# Patient Record
Sex: Male | Born: 1950
Health system: Southern US, Community
[De-identification: ages and names within clinical notes are randomized; demographics above are authoritative.]

## PROBLEM LIST (undated history)

## (undated) DIAGNOSIS — E785 Hyperlipidemia, unspecified: Secondary | ICD-10-CM

## (undated) DIAGNOSIS — E291 Testicular hypofunction: Secondary | ICD-10-CM

## (undated) DIAGNOSIS — C801 Malignant (primary) neoplasm, unspecified: Secondary | ICD-10-CM

## (undated) DIAGNOSIS — G4700 Insomnia, unspecified: Secondary | ICD-10-CM

## (undated) DIAGNOSIS — T7840XA Allergy, unspecified, initial encounter: Secondary | ICD-10-CM

## (undated) DIAGNOSIS — I10 Essential (primary) hypertension: Secondary | ICD-10-CM

## (undated) DIAGNOSIS — N529 Male erectile dysfunction, unspecified: Secondary | ICD-10-CM

## (undated) DIAGNOSIS — N200 Calculus of kidney: Secondary | ICD-10-CM

## (undated) DIAGNOSIS — IMO0002 Reserved for concepts with insufficient information to code with codable children: Secondary | ICD-10-CM

## (undated) DIAGNOSIS — E1149 Type 2 diabetes mellitus with other diabetic neurological complication: Secondary | ICD-10-CM

## (undated) DIAGNOSIS — S8290XA Unspecified fracture of unspecified lower leg, initial encounter for closed fracture: Secondary | ICD-10-CM

## (undated) HISTORY — DX: Insomnia, unspecified: G47.00

## (undated) HISTORY — DX: Testicular hypofunction: E29.1

## (undated) HISTORY — DX: Calculus of kidney: N20.0

## (undated) HISTORY — DX: Hyperlipidemia, unspecified: E78.5

## (undated) HISTORY — DX: Type 2 diabetes mellitus with other diabetic neurological complication: E11.49

## (undated) HISTORY — DX: Malignant (primary) neoplasm, unspecified: C80.1

## (undated) HISTORY — PX: TONSILLECTOMY: SUR1361

## (undated) HISTORY — DX: Male erectile dysfunction, unspecified: N52.9

## (undated) HISTORY — DX: Allergy, unspecified, initial encounter: T78.40XA

## (undated) HISTORY — DX: Essential (primary) hypertension: I10

## (undated) HISTORY — DX: Reserved for concepts with insufficient information to code with codable children: IMO0002

## (undated) HISTORY — PX: EYE SURGERY: SHX253

## (undated) HISTORY — PX: REFRACTIVE SURGERY: SHX103

## (undated) HISTORY — DX: Unspecified fracture of unspecified lower leg, initial encounter for closed fracture: S82.90XA

---
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
## 2002-09-29 HISTORY — PX: NASAL SINUS SURGERY: SHX719

## 2004-10-29 ENCOUNTER — Ambulatory Visit: Payer: Self-pay | Admitting: Otolaryngology

## 2006-06-04 ENCOUNTER — Ambulatory Visit: Payer: Self-pay | Admitting: Gastroenterology

## 2011-04-07 ENCOUNTER — Emergency Department: Payer: Self-pay | Admitting: *Deleted

## 2011-04-10 ENCOUNTER — Ambulatory Visit: Payer: Self-pay | Admitting: Urology

## 2011-04-17 ENCOUNTER — Ambulatory Visit: Payer: Self-pay | Admitting: Urology

## 2013-03-03 DIAGNOSIS — Z85828 Personal history of other malignant neoplasm of skin: Secondary | ICD-10-CM | POA: Insufficient documentation

## 2015-05-25 DIAGNOSIS — E1159 Type 2 diabetes mellitus with other circulatory complications: Secondary | ICD-10-CM | POA: Insufficient documentation

## 2015-05-25 DIAGNOSIS — E1149 Type 2 diabetes mellitus with other diabetic neurological complication: Secondary | ICD-10-CM | POA: Insufficient documentation

## 2015-05-25 DIAGNOSIS — I152 Hypertension secondary to endocrine disorders: Secondary | ICD-10-CM | POA: Insufficient documentation

## 2015-05-25 DIAGNOSIS — I10 Essential (primary) hypertension: Secondary | ICD-10-CM | POA: Insufficient documentation

## 2015-05-25 DIAGNOSIS — E785 Hyperlipidemia, unspecified: Secondary | ICD-10-CM | POA: Insufficient documentation

## 2015-05-29 ENCOUNTER — Encounter: Payer: Self-pay | Admitting: Family Medicine

## 2015-05-29 ENCOUNTER — Ambulatory Visit (INDEPENDENT_AMBULATORY_CARE_PROVIDER_SITE_OTHER): Payer: BLUE CROSS/BLUE SHIELD | Admitting: Family Medicine

## 2015-05-29 VITALS — BP 110/69 | HR 64 | Temp 98.7°F | Ht 70.2 in | Wt 214.0 lb

## 2015-05-29 DIAGNOSIS — E1149 Type 2 diabetes mellitus with other diabetic neurological complication: Secondary | ICD-10-CM

## 2015-05-29 DIAGNOSIS — E1140 Type 2 diabetes mellitus with diabetic neuropathy, unspecified: Secondary | ICD-10-CM | POA: Diagnosis not present

## 2015-05-29 DIAGNOSIS — I10 Essential (primary) hypertension: Secondary | ICD-10-CM | POA: Diagnosis not present

## 2015-05-29 DIAGNOSIS — G4700 Insomnia, unspecified: Secondary | ICD-10-CM | POA: Diagnosis not present

## 2015-05-29 DIAGNOSIS — E119 Type 2 diabetes mellitus without complications: Secondary | ICD-10-CM

## 2015-05-29 LAB — MICROALBUMIN, URINE WAIVED
CREATININE, URINE WAIVED: 200 mg/dL (ref 10–300)
MICROALB, UR WAIVED: 10 mg/L (ref 0–19)
Microalb/Creat Ratio: <30 mg/g (ref <30)

## 2015-05-29 LAB — BAYER DCA HB A1C WAIVED: HB A1C (BAYER DCA - WAIVED): 7 % — ABNORMAL HIGH (ref <7.0)

## 2015-05-29 MED ORDER — SILDENAFIL CITRATE 100 MG PO TABS: 100.0000 mg | ORAL_TABLET | Freq: Every day | ORAL | Status: DC | PRN

## 2015-05-29 MED ORDER — BENAZEPRIL HCL 40 MG PO TABS: 40.0000 mg | ORAL_TABLET | Freq: Every day | ORAL | Status: DC

## 2015-05-29 MED ORDER — DAPAGLIFLOZIN PROPANEDIOL 10 MG PO TABS: 10.0000 mg | ORAL_TABLET | Freq: Every day | ORAL | Status: DC

## 2015-05-29 MED ORDER — TRAZODONE HCL 50 MG PO TABS: 25.0000 mg | ORAL_TABLET | Freq: Every evening | ORAL | Status: DC | PRN

## 2015-05-29 MED ORDER — METFORMIN HCL 500 MG PO TABS: 1000.0000 mg | ORAL_TABLET | Freq: Two times a day (BID) | ORAL | Status: DC

## 2015-05-29 MED ORDER — AMLODIPINE BESYLATE 5 MG PO TABS: 5.0000 mg | ORAL_TABLET | Freq: Every day | ORAL | Status: DC

## 2015-05-29 MED ORDER — PIOGLITAZONE HCL 30 MG PO TABS: 30.0000 mg | ORAL_TABLET | Freq: Every day | ORAL | Status: DC

## 2015-05-29 MED ORDER — ROSUVASTATIN CALCIUM 40 MG PO TABS: 40.0000 mg | ORAL_TABLET | Freq: Every day | ORAL | Status: DC

## 2015-05-29 MED ORDER — ZOLPIDEM TARTRATE 10 MG PO TABS: 10.0000 mg | ORAL_TABLET | Freq: Every evening | ORAL | Status: DC | PRN

## 2015-05-29 NOTE — Progress Notes (Signed)
BP 110/69 mmHg  Pulse 64  Temp(Src) 98.7 F (37.1 C)  Ht 5' 10.2" (1.783 m)  Wt 214 lb (97.07 kg)  BMI 30.53 kg/m2  SpO2 97%   Subjective:    Patient ID: Mark Allen, male    DOB: [birth year], 64 y.o.   MRN: [id number]  HPI: Mark Allen is a 64 y.o. male  Chief Complaint  Patient presents with  . Diabetes   diabetes doing well with no problems no low blood sugar spells  For insomnia using Ambien 5 mg and sleeping well has been on this long-term  Hypertension doing well as are her lipids.  Relevant past medical, surgical, family and social history reviewed and updated as indicated. Interim medical history since our last visit reviewed. Allergies and medications reviewed and updated.  Review of Systems  Constitutional: Negative.   Respiratory: Negative.   Cardiovascular: Negative.     Per HPI unless specifically indicated above     Objective:    BP 110/69 mmHg  Pulse 64  Temp(Src) 98.7 F (37.1 C)  Ht 5' 10.2" (1.783 m)  Wt 214 lb (97.07 kg)  BMI 30.53 kg/m2  SpO2 97%  Wt Readings from Last 3 Encounters:  05/29/15 214 lb (97.07 kg)  01/31/15 213 lb (96.616 kg)    Physical Exam  Constitutional: He is oriented to person, place, and time. He appears well-developed and well-nourished. No distress.  HENT:  Head: Normocephalic and atraumatic.  Right Ear: Hearing normal.  Left Ear: Hearing normal.  Nose: Nose normal.  Eyes: Conjunctivae and lids are normal. Right eye exhibits no discharge. Left eye exhibits no discharge. No scleral icterus.  Cardiovascular: Normal rate, regular rhythm and normal heart sounds.   Pulmonary/Chest: Effort normal and breath sounds normal. No respiratory distress.  Musculoskeletal: Normal range of motion.  Neurological: He is alert and oriented to person, place, and time.  Diminished at toes seeing foot Dr  Skin: Skin is intact. No rash noted.  Psychiatric: He has a normal mood and affect. His speech is normal and behavior is  normal. Judgment and thought content normal. Cognition and memory are normal.    No results found for this or any previous visit.    Assessment & Plan:   Problem List Items Addressed This Visit      Cardiovascular and Mediastinum   Hypertension    The current medical regimen is effective;  continue present plan and medications. Cont diet and exercise      Relevant Medications   sildenafil (VIAGRA) 100 MG tablet   rosuvastatin (CRESTOR) 40 MG tablet   benazepril (LOTENSIN) 40 MG tablet   amLODipine (NORVASC) 5 MG tablet     Endocrine   Type II diabetes mellitus with neurological manifestations    The current medical regimen is effective;  continue present plan and medications.       Relevant Medications   rosuvastatin (CRESTOR) 40 MG tablet   pioglitazone (ACTOS) 30 MG tablet   metFORMIN (GLUCOPHAGE) 500 MG tablet   dapagliflozin propanediol (FARXIGA) 10 MG TABS tablet   benazepril (LOTENSIN) 40 MG tablet     Other   Insomnia    Discuss insomnia care and treatment need to limit Ambien for special occasions will try trazodone Discuss how to use rebound dreaming etc.      Relevant Medications   zolpidem (AMBIEN) 10 MG tablet   traZODone (DESYREL) 50 MG tablet    Other Visit Diagnoses    Diabetes mellitus without complication    -  Primary    Relevant Medications    rosuvastatin (CRESTOR) 40 MG tablet    pioglitazone (ACTOS) 30 MG tablet    metFORMIN (GLUCOPHAGE) 500 MG tablet    dapagliflozin propanediol (FARXIGA) 10 MG TABS tablet    benazepril (LOTENSIN) 40 MG tablet    Other Relevant Orders    Bayer DCA Hb A1c Waived    Microalbumin, Urine Waived        Follow up plan: Return in about 3 months (around 08/29/2015) for Physical Exam.

## 2015-05-29 NOTE — Assessment & Plan Note (Signed)
The current medical regimen is effective;  continue present plan and medications. Cont diet and exercise 

## 2015-05-29 NOTE — Assessment & Plan Note (Signed)
Discuss insomnia care and treatment need to limit Ambien for special occasions will try trazodone Discuss how to use rebound dreaming etc.

## 2015-05-29 NOTE — Assessment & Plan Note (Signed)
The current medical regimen is effective;  continue present plan and medications.  

## 2015-05-31 ENCOUNTER — Other Ambulatory Visit: Payer: Self-pay | Admitting: Family Medicine

## 2015-05-31 ENCOUNTER — Telehealth: Payer: Self-pay

## 2015-05-31 MED ORDER — METFORMIN HCL 500 MG PO TABS: 1000.0000 mg | ORAL_TABLET | Freq: Two times a day (BID) | ORAL | Status: DC

## 2015-05-31 NOTE — Telephone Encounter (Signed)
Metformin should be written for #120

## 2015-07-06 ENCOUNTER — Ambulatory Visit: Payer: BLUE CROSS/BLUE SHIELD

## 2015-07-06 DIAGNOSIS — Z23 Encounter for immunization: Secondary | ICD-10-CM

## 2015-08-21 ENCOUNTER — Encounter: Payer: Self-pay | Admitting: Family Medicine

## 2015-08-21 ENCOUNTER — Ambulatory Visit (INDEPENDENT_AMBULATORY_CARE_PROVIDER_SITE_OTHER): Payer: BLUE CROSS/BLUE SHIELD | Admitting: Family Medicine

## 2015-08-21 VITALS — BP 118/72 | HR 67 | Temp 98.2°F | Ht 70.1 in | Wt 215.0 lb

## 2015-08-21 DIAGNOSIS — E119 Type 2 diabetes mellitus without complications: Secondary | ICD-10-CM

## 2015-08-21 DIAGNOSIS — G4700 Insomnia, unspecified: Secondary | ICD-10-CM

## 2015-08-21 DIAGNOSIS — Z113 Encounter for screening for infections with a predominantly sexual mode of transmission: Secondary | ICD-10-CM

## 2015-08-21 DIAGNOSIS — E1149 Type 2 diabetes mellitus with other diabetic neurological complication: Secondary | ICD-10-CM | POA: Diagnosis not present

## 2015-08-21 DIAGNOSIS — I10 Essential (primary) hypertension: Secondary | ICD-10-CM

## 2015-08-21 DIAGNOSIS — Z0000 Encounter for general adult medical examination without abnormal findings: Secondary | ICD-10-CM

## 2015-08-21 LAB — URINALYSIS, ROUTINE W REFLEX MICROSCOPIC
Bilirubin, UA: NEGATIVE
LEUKOCYTES UA: NEGATIVE
NITRITE UA: NEGATIVE
PH UA: 6.5 (ref 5.0–7.5)
Protein, UA: NEGATIVE
RBC, UA: NEGATIVE
Specific Gravity, UA: 1.015 (ref 1.005–1.030)
UUROB: 1 mg/dL (ref 0.2–1.0)

## 2015-08-21 LAB — BAYER DCA HB A1C WAIVED: HB A1C (BAYER DCA - WAIVED): 7.3 % — ABNORMAL HIGH (ref <7.0)

## 2015-08-21 MED ORDER — DAPAGLIFLOZIN PROPANEDIOL 10 MG PO TABS: 10.0000 mg | ORAL_TABLET | Freq: Every day | ORAL | Status: DC

## 2015-08-21 MED ORDER — BENAZEPRIL HCL 40 MG PO TABS: 40.0000 mg | ORAL_TABLET | Freq: Every day | ORAL | Status: DC

## 2015-08-21 MED ORDER — METFORMIN HCL 500 MG PO TABS: 1000.0000 mg | ORAL_TABLET | Freq: Two times a day (BID) | ORAL | Status: DC

## 2015-08-21 MED ORDER — AMLODIPINE BESYLATE 5 MG PO TABS: 5.0000 mg | ORAL_TABLET | Freq: Every day | ORAL | Status: DC

## 2015-08-21 MED ORDER — PIOGLITAZONE HCL 30 MG PO TABS: 30.0000 mg | ORAL_TABLET | Freq: Every day | ORAL | Status: DC

## 2015-08-21 MED ORDER — ROSUVASTATIN CALCIUM 40 MG PO TABS: 40.0000 mg | ORAL_TABLET | Freq: Every day | ORAL | Status: DC

## 2015-08-21 MED ORDER — TRAZODONE HCL 50 MG PO TABS: 25.0000 mg | ORAL_TABLET | Freq: Every evening | ORAL | Status: DC | PRN

## 2015-08-21 NOTE — Assessment & Plan Note (Signed)
The current medical regimen is effective;  continue present plan and medications.  

## 2015-08-21 NOTE — Assessment & Plan Note (Signed)
Stable with trazodone uses rare Ambien

## 2015-08-21 NOTE — Assessment & Plan Note (Signed)
Patient with elevated A1c Discussed better diet exercise nutrition for control with energy and feeling better. We'll not add more medicine at this point.

## 2015-08-21 NOTE — Progress Notes (Signed)
BP 118/72 mmHg  Pulse 67  Temp(Src) 98.2 F (36.8 C)  Ht 5' 10.1" (1.781 m)  Wt 215 lb (97.523 kg)  BMI 30.75 kg/m2  SpO2 96%   Subjective:    Patient ID: Mark Allen, male    DOB: 1950-12-29, 64 y.o.   MRN: [id number]  HPI: Mark Allen is a 64 y.o. male  Chief Complaint  Patient presents with  . Annual Exam  . Diabetes   Patient doing okay except some fatigue but on review limited sleep with poor nutrition Having some foot issues No foot numbness Does have scratches on his leg from his dog Relevant past medical, surgical, family and social history reviewed and updated as indicated. Interim medical history since our last visit reviewed. Allergies and medications reviewed and updated.  Review of Systems  Constitutional: Negative.   HENT: Negative.   Eyes: Negative.   Respiratory: Negative.   Cardiovascular: Negative.   Gastrointestinal: Negative.   Endocrine: Negative.   Genitourinary: Negative.   Musculoskeletal: Negative.   Skin: Negative.   Allergic/Immunologic: Negative.   Neurological: Negative.   Hematological: Negative.   Psychiatric/Behavioral: Negative.     Per HPI unless specifically indicated above     Objective:    BP 118/72 mmHg  Pulse 67  Temp(Src) 98.2 F (36.8 C)  Ht 5' 10.1" (1.781 m)  Wt 215 lb (97.523 kg)  BMI 30.75 kg/m2  SpO2 96%  Wt Readings from Last 3 Encounters:  08/21/15 215 lb (97.523 kg)  05/29/15 214 lb (97.07 kg)  01/31/15 213 lb (96.616 kg)    Physical Exam  Constitutional: He is oriented to person, place, and time. He appears well-developed and well-nourished.  HENT:  Head: Normocephalic and atraumatic.  Right Ear: External ear normal.  Left Ear: External ear normal.  Eyes: Conjunctivae and EOM are normal. Pupils are equal, round, and reactive to light.  Neck: Normal range of motion. Neck supple.  Cardiovascular: Normal rate, regular rhythm, normal heart sounds and intact distal pulses.   Pulmonary/Chest:  Effort normal and breath sounds normal.  Abdominal: Soft. Bowel sounds are normal. There is no splenomegaly or hepatomegaly.  Genitourinary: Rectum normal, prostate normal and penis normal.  Musculoskeletal: Normal range of motion.  Neurological: He is alert and oriented to person, place, and time. He has normal reflexes.  Skin: No rash noted. No erythema.  Psychiatric: He has a normal mood and affect. His behavior is normal. Judgment and thought content normal.    Results for orders placed or performed in visit on 05/29/15  Bayer DCA Hb A1c Waived  Result Value Ref Range   Bayer DCA Hb A1c Waived 7.0 (H) <7.0 %  Microalbumin, Urine Waived  Result Value Ref Range   Microalb, Ur Waived 10 0 - 19 mg/L   Creatinine, Urine Waived 200 10 - 300 mg/dL   Microalb/Creat Ratio <30 <30 mg/g      Assessment & Plan:   Problem List Items Addressed This Visit      Cardiovascular and Mediastinum   Hypertension    The current medical regimen is effective;  continue present plan and medications.       Relevant Medications   amLODipine (NORVASC) 5 MG tablet   benazepril (LOTENSIN) 40 MG tablet   rosuvastatin (CRESTOR) 40 MG tablet     Endocrine   Type II diabetes mellitus with neurological manifestations [facility])    Patient with elevated A1c Discussed better diet exercise nutrition for control with energy and feeling  better. We'll not add more medicine at this point.      Relevant Medications   benazepril (LOTENSIN) 40 MG tablet   dapagliflozin propanediol (FARXIGA) 10 MG TABS tablet   pioglitazone (ACTOS) 30 MG tablet   rosuvastatin (CRESTOR) 40 MG tablet   metFORMIN (GLUCOPHAGE) 500 MG tablet     Other   Insomnia    Stable with trazodone uses rare Ambien      Relevant Medications   traZODone (DESYREL) 50 MG tablet    Other Visit Diagnoses    Routine general medical examination at a health care facility    -  Primary    Relevant Orders    CBC with Differential/Platelet     Comprehensive metabolic panel    Lipid Panel w/o Chol/HDL Ratio    PSA    TSH    Urinalysis, Routine w reflex microscopic (not at [facility])    Routine screening for STI (sexually transmitted infection)        Relevant Orders    HIV antibody    Hepatitis C Antibody    Diabetes mellitus without complication (HCC)        Relevant Medications    benazepril (LOTENSIN) 40 MG tablet    dapagliflozin propanediol (FARXIGA) 10 MG TABS tablet    pioglitazone (ACTOS) 30 MG tablet    rosuvastatin (CRESTOR) 40 MG tablet    metFORMIN (GLUCOPHAGE) 500 MG tablet    Other Relevant Orders    Bayer DCA Hb A1c Waived        Follow up plan: Return in about 3 months (around 11/21/2015) for A1c.

## 2015-08-22 ENCOUNTER — Encounter: Payer: Self-pay | Admitting: Family Medicine

## 2015-08-22 LAB — COMPREHENSIVE METABOLIC PANEL
A/G RATIO: 1.5 (ref 1.1–2.5)
ALT: 20 IU/L (ref 0–44)
AST: 15 IU/L (ref 0–40)
Albumin: 4.3 g/dL (ref 3.6–4.8)
Alkaline Phosphatase: 73 IU/L (ref 39–117)
BUN/Creatinine Ratio: 20 (ref 10–22)
BUN: 16 mg/dL (ref 8–27)
Bilirubin Total: 0.5 mg/dL (ref 0.0–1.2)
CALCIUM: 9.5 mg/dL (ref 8.6–10.2)
CO2: 26 mmol/L (ref 18–29)
CREATININE: 0.79 mg/dL (ref 0.76–1.27)
Chloride: 102 mmol/L (ref 97–106)
GFR, EST AFRICAN AMERICAN: 111 mL/min/{1.73_m2} (ref >59)
GFR, EST NON AFRICAN AMERICAN: 96 mL/min/{1.73_m2} (ref >59)
GLOBULIN, TOTAL: 2.8 g/dL (ref 1.5–4.5)
Glucose: 150 mg/dL — ABNORMAL HIGH (ref 65–99)
POTASSIUM: 4.7 mmol/L (ref 3.5–5.2)
SODIUM: 141 mmol/L (ref 136–144)
TOTAL PROTEIN: 7.1 g/dL (ref 6.0–8.5)

## 2015-08-22 LAB — CBC WITH DIFFERENTIAL/PLATELET
BASOS: 0 %
Basophils Absolute: 0 10*3/uL (ref 0.0–0.2)
EOS (ABSOLUTE): 0.2 10*3/uL (ref 0.0–0.4)
EOS: 2 %
HEMATOCRIT: 46.4 % (ref 37.5–51.0)
Hemoglobin: 15.7 g/dL (ref 12.6–17.7)
IMMATURE GRANS (ABS): 0 10*3/uL (ref 0.0–0.1)
IMMATURE GRANULOCYTES: 0 %
LYMPHS: 27 %
Lymphocytes Absolute: 2.2 10*3/uL (ref 0.7–3.1)
MCH: 31.3 pg (ref 26.6–33.0)
MCHC: 33.8 g/dL (ref 31.5–35.7)
MCV: 93 fL (ref 79–97)
MONOCYTES: 12 %
Monocytes Absolute: 1 10*3/uL — ABNORMAL HIGH (ref 0.1–0.9)
NEUTROS PCT: 59 %
Neutrophils Absolute: 4.9 10*3/uL (ref 1.4–7.0)
Platelets: 367 10*3/uL (ref 150–379)
RBC: 5.01 x10E6/uL (ref 4.14–5.80)
RDW: 13.3 % (ref 12.3–15.4)
WBC: 8.3 10*3/uL (ref 3.4–10.8)

## 2015-08-22 LAB — PSA: PROSTATE SPECIFIC AG, SERUM: 0.2 ng/mL (ref 0.0–4.0)

## 2015-08-22 LAB — TSH: TSH: 1.27 u[IU]/mL (ref 0.450–4.500)

## 2015-08-22 LAB — LIPID PANEL W/O CHOL/HDL RATIO
Cholesterol, Total: 167 mg/dL (ref 100–199)
HDL: 58 mg/dL (ref >39)
LDL CALC: 90 mg/dL (ref 0–99)
Triglycerides: 95 mg/dL (ref 0–149)
VLDL Cholesterol Cal: 19 mg/dL (ref 5–40)

## 2015-08-22 LAB — HIV ANTIBODY (ROUTINE TESTING W REFLEX): HIV SCREEN 4TH GENERATION: NONREACTIVE

## 2015-08-22 LAB — HEPATITIS C ANTIBODY: Hep C Virus Ab: 0.1 s/co ratio (ref 0.0–0.9)

## 2015-11-21 ENCOUNTER — Encounter: Payer: Self-pay | Admitting: Family Medicine

## 2015-11-21 ENCOUNTER — Ambulatory Visit (INDEPENDENT_AMBULATORY_CARE_PROVIDER_SITE_OTHER): Payer: BLUE CROSS/BLUE SHIELD | Admitting: Family Medicine

## 2015-11-21 VITALS — BP 119/74 | HR 57 | Temp 98.5°F | Ht 69.3 in | Wt 209.0 lb

## 2015-11-21 DIAGNOSIS — I10 Essential (primary) hypertension: Secondary | ICD-10-CM

## 2015-11-21 DIAGNOSIS — E1149 Type 2 diabetes mellitus with other diabetic neurological complication: Secondary | ICD-10-CM | POA: Diagnosis not present

## 2015-11-21 DIAGNOSIS — G4700 Insomnia, unspecified: Secondary | ICD-10-CM

## 2015-11-21 DIAGNOSIS — E785 Hyperlipidemia, unspecified: Secondary | ICD-10-CM | POA: Diagnosis not present

## 2015-11-21 LAB — BAYER DCA HB A1C WAIVED: HB A1C: 7.2 % — AB (ref <7.0)

## 2015-11-21 MED ORDER — DULOXETINE HCL 60 MG PO CPEP: 60.0000 mg | ORAL_CAPSULE | Freq: Every day | ORAL | Status: DC

## 2015-11-21 MED ORDER — DULOXETINE HCL 30 MG PO CPEP: 30.0000 mg | ORAL_CAPSULE | Freq: Every day | ORAL | Status: DC

## 2015-11-21 NOTE — Assessment & Plan Note (Addendum)
Patient with A1c of 7.2 7.3 in November will continue diet exercise for control if not better next visit will consider more medications Chart review neuropathy tried gabapentin and Lyrica hasn't given Cymbalta a trial

## 2015-11-21 NOTE — Progress Notes (Addendum)
BP 119/74 mmHg  Pulse 57  Temp(Src) 98.5 F (36.9 C)  Ht 5' 9.3" (1.76 m)  Wt 209 lb (94.802 kg)  BMI 30.60 kg/m2  SpO2 96%   Subjective:    Patient ID: Mark Allen, male    DOB: 1951-07-23, 65 y.o.   MRN: [id number]  HPI: Mark Allen is a 65 y.o. male  Chief Complaint  Patient presents with  . Diabetes   Patient all in all doing well has lost weight over the holiday season no low blood sugar spells doing well with medications takes faithfully with no side effects Blood pressure good control Cholesterol good control Sleep does well Takes trazodone nightly Ambien on a rare occasions Patient with diabetic peripheral neuropathy bothers him again will stress something else thinks he took gabapentin previously Relevant past medical, surgical, family and social history reviewed and updated as indicated. Interim medical history since our last visit reviewed. Allergies and medications reviewed and updated.  Review of Systems  Constitutional: Negative.   Respiratory: Negative.   Cardiovascular: Negative.     Per HPI unless specifically indicated above     Objective:    BP 119/74 mmHg  Pulse 57  Temp(Src) 98.5 F (36.9 C)  Ht 5' 9.3" (1.76 m)  Wt 209 lb (94.802 kg)  BMI 30.60 kg/m2  SpO2 96%  Wt Readings from Last 3 Encounters:  11/21/15 209 lb (94.802 kg)  08/21/15 215 lb (97.523 kg)  05/29/15 214 lb (97.07 kg)    Physical Exam  Constitutional: He is oriented to person, place, and time. He appears well-developed and well-nourished. No distress.  HENT:  Head: Normocephalic and atraumatic.  Right Ear: Hearing normal.  Left Ear: Hearing normal.  Nose: Nose normal.  Eyes: Conjunctivae and lids are normal. Right eye exhibits no discharge. Left eye exhibits no discharge. No scleral icterus.  Cardiovascular: Normal rate, regular rhythm and normal heart sounds.   Pulmonary/Chest: Effort normal and breath sounds normal. No respiratory distress.  Musculoskeletal:  Normal range of motion.  Neurological: He is alert and oriented to person, place, and time.  Skin: Skin is intact. No rash noted.  Psychiatric: He has a normal mood and affect. His speech is normal and behavior is normal. Judgment and thought content normal. Cognition and memory are normal.    Results for orders placed or performed in visit on 08/21/15  CBC with Differential/Platelet  Result Value Ref Range   WBC 8.3 3.4 - 10.8 x10E3/uL   RBC 5.01 4.14 - 5.80 x10E6/uL   Hemoglobin 15.7 12.6 - 17.7 g/dL   Hematocrit 46.4 37.5 - 51.0 %   MCV 93 79 - 97 fL   MCH 31.3 26.6 - 33.0 pg   MCHC 33.8 31.5 - 35.7 g/dL   RDW 13.3 12.3 - 15.4 %   Platelets 367 150 - 379 x10E3/uL   Neutrophils 59 %   Lymphs 27 %   Monocytes 12 %   Eos 2 %   Basos 0 %   Neutrophils Absolute 4.9 1.4 - 7.0 x10E3/uL   Lymphocytes Absolute 2.2 0.7 - 3.1 x10E3/uL   Monocytes Absolute 1.0 (H) 0.1 - 0.9 x10E3/uL   EOS (ABSOLUTE) 0.2 0.0 - 0.4 x10E3/uL   Basophils Absolute 0.0 0.0 - 0.2 x10E3/uL   Immature Granulocytes 0 %   Immature Grans (Abs) 0.0 0.0 - 0.1 x10E3/uL  Comprehensive metabolic panel  Result Value Ref Range   Glucose 150 (H) 65 - 99 mg/dL   BUN 16 8 -  27 mg/dL   Creatinine, Ser 0.79 0.76 - 1.27 mg/dL   GFR calc non Af Amer 96 >59 mL/min/1.73   GFR calc Af Amer 111 >59 mL/min/1.73   BUN/Creatinine Ratio 20 10 - 22   Sodium 141 136 - 144 mmol/L   Potassium 4.7 3.5 - 5.2 mmol/L   Chloride 102 97 - 106 mmol/L   CO2 26 18 - 29 mmol/L   Calcium 9.5 8.6 - 10.2 mg/dL   Total Protein 7.1 6.0 - 8.5 g/dL   Albumin 4.3 3.6 - 4.8 g/dL   Globulin, Total 2.8 1.5 - 4.5 g/dL   Albumin/Globulin Ratio 1.5 1.1 - 2.5   Bilirubin Total 0.5 0.0 - 1.2 mg/dL   Alkaline Phosphatase 73 39 - 117 IU/L   AST 15 0 - 40 IU/L   ALT 20 0 - 44 IU/L  Lipid Panel w/o Chol/HDL Ratio  Result Value Ref Range   Cholesterol, Total 167 100 - 199 mg/dL   Triglycerides 95 0 - 149 mg/dL   HDL 58 >39 mg/dL   VLDL Cholesterol Cal 19  5 - 40 mg/dL   LDL Calculated 90 0 - 99 mg/dL  PSA  Result Value Ref Range   Prostate Specific Ag, Serum 0.2 0.0 - 4.0 ng/mL  HIV antibody  Result Value Ref Range   HIV Screen 4th Generation wRfx Non Reactive Non Reactive  TSH  Result Value Ref Range   TSH 1.270 0.450 - 4.500 uIU/mL  Urinalysis, Routine w reflex microscopic (not at [facility])  Result Value Ref Range   Specific Gravity, UA 1.015 1.005 - 1.030   pH, UA 6.5 5.0 - 7.5   Color, UA Yellow Yellow   Appearance Ur Clear Clear   Leukocytes, UA Negative Negative   Protein, UA Negative Negative/Trace   Glucose, UA 3+ (A) Negative   Ketones, UA Trace (A) Negative   RBC, UA Negative Negative   Bilirubin, UA Negative Negative   Urobilinogen, Ur 1.0 0.2 - 1.0 mg/dL   Nitrite, UA Negative Negative  Hepatitis C Antibody  Result Value Ref Range   Hep C Virus Ab 0.1 0.0 - 0.9 s/co ratio  Bayer DCA Hb A1c Waived  Result Value Ref Range   Bayer DCA Hb A1c Waived 7.3 (H) <7.0 %      Assessment & Plan:   Problem List Items Addressed This Visit      Cardiovascular and Mediastinum   Hypertension    The current medical regimen is effective;  continue present plan and medications.         Endocrine   Type II diabetes mellitus with neurological manifestations ([place]) - Primary    Patient with A1c of 7.2 7.3 in November will continue diet exercise for control if not better next visit will consider more medications Chart review neuropathy tried gabapentin and Lyrica hasn't given Cymbalta a trial      Relevant Orders   Bayer DCA Hb A1c Waived     Other   Insomnia    The current medical regimen is effective;  continue present plan and medications.       Hyperlipidemia    The current medical regimen is effective;  continue present plan and medications.           Follow up plan: Return in about 3 months (around 02/18/2016) for Med check plus BMP, lipids, ALT, AST, BMP.

## 2015-11-21 NOTE — Assessment & Plan Note (Signed)
The current medical regimen is effective;  continue present plan and medications.  

## 2015-11-21 NOTE — Addendum Note (Signed)
Addended byGolden Pop on: 11/21/2015 04:17 PM   Modules accepted: Orders

## 2016-02-21 ENCOUNTER — Encounter: Payer: Self-pay | Admitting: Family Medicine

## 2016-02-21 ENCOUNTER — Ambulatory Visit (INDEPENDENT_AMBULATORY_CARE_PROVIDER_SITE_OTHER): Payer: BLUE CROSS/BLUE SHIELD | Admitting: Family Medicine

## 2016-02-21 VITALS — BP 131/77 | HR 67 | Temp 97.7°F | Ht 69.3 in | Wt 203.0 lb

## 2016-02-21 DIAGNOSIS — G4700 Insomnia, unspecified: Secondary | ICD-10-CM | POA: Diagnosis not present

## 2016-02-21 DIAGNOSIS — E785 Hyperlipidemia, unspecified: Secondary | ICD-10-CM

## 2016-02-21 DIAGNOSIS — I10 Essential (primary) hypertension: Secondary | ICD-10-CM

## 2016-02-21 DIAGNOSIS — E1149 Type 2 diabetes mellitus with other diabetic neurological complication: Secondary | ICD-10-CM

## 2016-02-21 LAB — LP+ALT+AST PICCOLO, WAIVED
ALT (SGPT) Piccolo, Waived: 18 U/L (ref 10–47)
AST (SGOT) Piccolo, Waived: 27 U/L (ref 11–38)
Chol/HDL Ratio Piccolo,Waive: 2.6 mg/dL
Cholesterol Piccolo, Waived: 174 mg/dL (ref <200)
HDL CHOL PICCOLO, WAIVED: 68 mg/dL (ref >59)
LDL Chol Calc Piccolo Waived: 89 mg/dL (ref <100)
Triglycerides Piccolo,Waived: 86 mg/dL (ref <150)
VLDL Chol Calc Piccolo,Waive: 17 mg/dL (ref <30)

## 2016-02-21 LAB — BAYER DCA HB A1C WAIVED: HB A1C (BAYER DCA - WAIVED): 7.1 % — ABNORMAL HIGH (ref <7.0)

## 2016-02-21 MED ORDER — ZOLPIDEM TARTRATE 10 MG PO TABS: 10.0000 mg | ORAL_TABLET | Freq: Every evening | ORAL | Status: DC | PRN

## 2016-02-21 MED ORDER — DULOXETINE HCL 60 MG PO CPEP: 60.0000 mg | ORAL_CAPSULE | Freq: Every day | ORAL | Status: DC

## 2016-02-21 NOTE — Progress Notes (Signed)
BP 131/77 mmHg  Pulse 67  Temp(Src) 97.7 F (36.5 C)  Ht 5' 9.3" (1.76 m)  Wt 203 lb (92.08 kg)  BMI 29.73 kg/m2  SpO2 98%   Subjective:    Patient ID: Mark Allen, male    DOB: 06-Nov-1950, 65 y.o.   MRN: [id number]  HPI: Mark Allen is a 65 y.o. male  Chief Complaint  Patient presents with  . Diabetes  . Hyperlipidemia  . Hypertension  Patient rechecks doing well with great weight loss 12 pounds over the last 6 months blood sugars been doing well no complaints no problems with medications no side effects noted low blood sugar spells Blood pressure cholesterol doing well no complaints taking medications faithfully spelled good diet  Relevant past medical, surgical, family and social history reviewed and updated as indicated. Interim medical history since our last visit reviewed. Allergies and medications reviewed and updated.  Review of Systems  Constitutional: Negative.   Respiratory: Negative.   Cardiovascular: Negative.     Per HPI unless specifically indicated above     Objective:    BP 131/77 mmHg  Pulse 67  Temp(Src) 97.7 F (36.5 C)  Ht 5' 9.3" (1.76 m)  Wt 203 lb (92.08 kg)  BMI 29.73 kg/m2  SpO2 98%  Wt Readings from Last 3 Encounters:  02/21/16 203 lb (92.08 kg)  11/21/15 209 lb (94.802 kg)  08/21/15 215 lb (97.523 kg)    Physical Exam  Constitutional: He is oriented to person, place, and time. He appears well-developed and well-nourished. No distress.  HENT:  Head: Normocephalic and atraumatic.  Right Ear: Hearing normal.  Left Ear: Hearing normal.  Nose: Nose normal.  Eyes: Conjunctivae and lids are normal. Right eye exhibits no discharge. Left eye exhibits no discharge. No scleral icterus.  Cardiovascular: Normal rate, regular rhythm and normal heart sounds.   Pulmonary/Chest: Effort normal and breath sounds normal. No respiratory distress.  Musculoskeletal: Normal range of motion.  Neurological: He is alert and oriented to person,  place, and time.  Skin: Skin is intact. No rash noted.  Psychiatric: He has a normal mood and affect. His speech is normal and behavior is normal. Judgment and thought content normal. Cognition and memory are normal.    Results for orders placed or performed in visit on 11/21/15  Bayer DCA Hb A1c Waived  Result Value Ref Range   Bayer DCA Hb A1c Waived 7.2 (H) <7.0 %      Assessment & Plan:   Problem List Items Addressed This Visit      Cardiovascular and Mediastinum   Hypertension    The current medical regimen is effective;  continue present plan and medications.       Relevant Orders   Basic metabolic panel     Endocrine   Type II diabetes mellitus with neurological manifestations ([place]) - Primary    The current medical regimen is effective;  continue present plan and medications.       Relevant Orders   Bayer DCA Hb A1c Waived     Other   Hyperlipidemia    The current medical regimen is effective;  continue present plan and medications. Discuss LDL over 100 pt  drops from 2007 patient will try to get better control with continuing diet exercise nutrition otherwise the medications alone      Relevant Orders   LP+ALT+AST [place], Waived   Insomnia   Relevant Medications   zolpidem (AMBIEN) 10 MG tablet  Follow up plan: Return in about 3 months (around 05/23/2016), or if symptoms worsen or fail to improve, for A1c.

## 2016-02-21 NOTE — Assessment & Plan Note (Signed)
The current medical regimen is effective;  continue present plan and medications.  

## 2016-02-21 NOTE — Assessment & Plan Note (Signed)
The current medical regimen is effective;  continue present plan and medications. Discuss LDL over 100 pt  drops from 2007 patient will try to get better control with continuing diet exercise nutrition otherwise the medications alone

## 2016-02-22 LAB — BASIC METABOLIC PANEL
BUN/Creatinine Ratio: 28 — ABNORMAL HIGH (ref 10–24)
BUN: 20 mg/dL (ref 8–27)
CALCIUM: 9.3 mg/dL (ref 8.6–10.2)
CHLORIDE: 99 mmol/L (ref 96–106)
CO2: 23 mmol/L (ref 18–29)
CREATININE: 0.71 mg/dL — AB (ref 0.76–1.27)
GFR, EST AFRICAN AMERICAN: 115 mL/min/{1.73_m2} (ref >59)
GFR, EST NON AFRICAN AMERICAN: 100 mL/min/{1.73_m2} (ref >59)
Glucose: 95 mg/dL (ref 65–99)
Potassium: 4.4 mmol/L (ref 3.5–5.2)
Sodium: 141 mmol/L (ref 134–144)

## 2016-02-26 ENCOUNTER — Encounter: Payer: Self-pay | Admitting: Family Medicine

## 2016-05-14 LAB — HM DIABETES EYE EXAM

## 2016-05-22 ENCOUNTER — Other Ambulatory Visit: Payer: Self-pay

## 2016-05-22 ENCOUNTER — Ambulatory Visit (INDEPENDENT_AMBULATORY_CARE_PROVIDER_SITE_OTHER): Payer: BLUE CROSS/BLUE SHIELD | Admitting: Family Medicine

## 2016-05-22 ENCOUNTER — Encounter: Payer: Self-pay | Admitting: Family Medicine

## 2016-05-22 VITALS — BP 151/82 | HR 61 | Temp 98.0°F | Ht 69.0 in | Wt 208.6 lb

## 2016-05-22 DIAGNOSIS — E1140 Type 2 diabetes mellitus with diabetic neuropathy, unspecified: Secondary | ICD-10-CM

## 2016-05-22 DIAGNOSIS — E1149 Type 2 diabetes mellitus with other diabetic neurological complication: Secondary | ICD-10-CM

## 2016-05-22 DIAGNOSIS — Z1211 Encounter for screening for malignant neoplasm of colon: Secondary | ICD-10-CM | POA: Diagnosis not present

## 2016-05-22 DIAGNOSIS — I10 Essential (primary) hypertension: Secondary | ICD-10-CM | POA: Diagnosis not present

## 2016-05-22 DIAGNOSIS — E785 Hyperlipidemia, unspecified: Secondary | ICD-10-CM

## 2016-05-22 DIAGNOSIS — Z1212 Encounter for screening for malignant neoplasm of rectum: Secondary | ICD-10-CM

## 2016-05-22 LAB — BAYER DCA HB A1C WAIVED: HB A1C (BAYER DCA - WAIVED): 7.6 % — ABNORMAL HIGH (ref <7.0)

## 2016-05-22 LAB — HEMOGLOBIN A1C: Hemoglobin A1C: 7.6

## 2016-05-22 LAB — MICROALBUMIN, URINE WAIVED
Creatinine, Urine Waived: 200 mg/dL (ref 10–300)
Microalb, Ur Waived: 30 mg/L — ABNORMAL HIGH (ref 0–19)
Microalb/Creat Ratio: <30 mg/g (ref <30)

## 2016-05-22 MED ORDER — EMPAGLIFLOZIN 25 MG PO TABS: 25.0000 mg | ORAL_TABLET | Freq: Every day | ORAL | 4 refills | Status: DC

## 2016-05-22 NOTE — Progress Notes (Signed)
BP (!) 151/82 (BP Location: Left Arm, Patient Position: Sitting, Cuff Size: Normal)   Pulse 61   Temp 98 F (36.7 C)   Ht 5\' 9"  (1.753 m)   Wt 208 lb 9.6 oz (94.6 kg)   BMI 30.80 kg/m    Subjective:    Patient ID: Mark Allen, male    DOB: July 25, 1951, 65 y.o.   MRN: [id number]  HPI: Mark Allen is a 65 y.o. male  Chief Complaint  Patient presents with  . Follow-up  . Diabetes  Patient follow-up has been struggling has run out of blood pressure medicines and not taken today. As a consequence blood pressure is of course up. Diabetes patient had stopped for cigarettes some time ago due to unknown insurance hassles and has also been gaining weight because of summertime enjoyment of food.  Other meds have been doing okay.  Relevant past medical, surgical, family and social history reviewed and updated as indicated. Interim medical history since our last visit reviewed. Allergies and medications reviewed and updated.  Review of Systems  Constitutional: Negative.   Respiratory: Negative.   Cardiovascular: Negative.     Per HPI unless specifically indicated above     Objective:    BP (!) 151/82 (BP Location: Left Arm, Patient Position: Sitting, Cuff Size: Normal)   Pulse 61   Temp 98 F (36.7 C)   Ht 5\' 9"  (1.753 m)   Wt 208 lb 9.6 oz (94.6 kg)   BMI 30.80 kg/m   Wt Readings from Last 3 Encounters:  05/22/16 208 lb 9.6 oz (94.6 kg)  02/21/16 203 lb (92.1 kg)  11/21/15 209 lb (94.8 kg)    Physical Exam  Constitutional: He is oriented to person, place, and time. He appears well-developed and well-nourished. No distress.  HENT:  Head: Normocephalic and atraumatic.  Right Ear: Hearing normal.  Left Ear: Hearing normal.  Nose: Nose normal.  Eyes: Conjunctivae and lids are normal. Right eye exhibits no discharge. Left eye exhibits no discharge. No scleral icterus.  Cardiovascular: Normal rate, regular rhythm and normal heart sounds.   Pulmonary/Chest: Effort  normal and breath sounds normal. No respiratory distress.  Musculoskeletal: Normal range of motion.  Neurological: He is alert and oriented to person, place, and time.  Skin: Skin is intact. No rash noted.  Psychiatric: He has a normal mood and affect. His speech is normal and behavior is normal. Judgment and thought content normal. Cognition and memory are normal.    Results for orders placed or performed in visit on 05/22/16  HM DIABETES EYE EXAM  Result Value Ref Range   HM Diabetic Eye Exam No Retinopathy No Retinopathy      Assessment & Plan:   Problem List Items Addressed This Visit      Cardiovascular and Mediastinum   Hypertension    Poor control but off medicines will restart        Endocrine   Type II diabetes mellitus with neurological manifestations ([place])    Control will start Jardiance discuss better diet nutrition weight loss medication complications with insurance patient will call if any complications to avoid loss of treatment and worsening of neurologic symptoms.      Relevant Medications   empagliflozin (JARDIANCE) 25 MG TABS tablet     Other   Hyperlipidemia    The current medical regimen is effective;  continue present plan and medications.        Other Visit Diagnoses    Type 2 diabetes  mellitus with diabetic neuropathy, unspecified long term insulin use status ([place])       Relevant Medications   empagliflozin (JARDIANCE) 25 MG TABS tablet       Follow up plan: Return in about 3 months (around 08/22/2016) for Physical Exam, Hemoglobin A1c.

## 2016-05-22 NOTE — Assessment & Plan Note (Signed)
The current medical regimen is effective;  continue present plan and medications.  

## 2016-05-22 NOTE — Addendum Note (Signed)
Addended by: Sandria Manly on: 05/22/2016 05:14 PM   Modules accepted: Orders

## 2016-05-22 NOTE — Assessment & Plan Note (Signed)
Poor control but off medicines will restart

## 2016-05-22 NOTE — Assessment & Plan Note (Signed)
Control will start Jardiance discuss better diet nutrition weight loss medication complications with insurance patient will call if any complications to avoid loss of treatment and worsening of neurologic symptoms.

## 2016-06-25 ENCOUNTER — Ambulatory Visit (INDEPENDENT_AMBULATORY_CARE_PROVIDER_SITE_OTHER): Payer: BLUE CROSS/BLUE SHIELD

## 2016-06-25 ENCOUNTER — Telehealth: Payer: Self-pay

## 2016-06-25 DIAGNOSIS — Z23 Encounter for immunization: Secondary | ICD-10-CM

## 2016-06-25 NOTE — Telephone Encounter (Signed)
Patient is interested in having a Sleep Study.  Wife has noticed snoring.  I gave him a [employer] form to call his insurance about coverage and to go over symptoms.   Wants a call when MAC returns, about referral

## 2016-07-01 NOTE — Telephone Encounter (Signed)
Patient will hold off on Sleep Study, insurance and cost are an issue

## 2016-07-10 ENCOUNTER — Other Ambulatory Visit: Payer: Self-pay | Admitting: Family Medicine

## 2016-07-30 LAB — HM DIABETES EYE EXAM

## 2016-08-27 ENCOUNTER — Other Ambulatory Visit: Payer: Self-pay | Admitting: Family Medicine

## 2016-08-27 DIAGNOSIS — G4700 Insomnia, unspecified: Secondary | ICD-10-CM

## 2016-08-27 NOTE — Telephone Encounter (Signed)
Routing to provider, he has appt on 09/16/16.

## 2016-08-28 ENCOUNTER — Other Ambulatory Visit: Payer: Self-pay

## 2016-08-28 NOTE — Telephone Encounter (Signed)
Request on refill for Ambien 10mg  tablet.  Last visit 05/22/2016 Upcoming Appt 09/16/2016

## 2016-09-01 MED ORDER — ZOLPIDEM TARTRATE 10 MG PO TABS: 10.0000 mg | ORAL_TABLET | Freq: Every evening | ORAL | 1 refills | Status: DC | PRN

## 2016-09-09 ENCOUNTER — Other Ambulatory Visit: Payer: Self-pay | Admitting: Family Medicine

## 2016-09-09 DIAGNOSIS — I10 Essential (primary) hypertension: Secondary | ICD-10-CM

## 2016-09-09 DIAGNOSIS — E1149 Type 2 diabetes mellitus with other diabetic neurological complication: Secondary | ICD-10-CM

## 2016-09-16 ENCOUNTER — Encounter: Payer: Self-pay | Admitting: Family Medicine

## 2016-09-16 ENCOUNTER — Ambulatory Visit (INDEPENDENT_AMBULATORY_CARE_PROVIDER_SITE_OTHER): Payer: BLUE CROSS/BLUE SHIELD | Admitting: Family Medicine

## 2016-09-16 VITALS — BP 130/86 | HR 73 | Temp 98.6°F | Ht 70.0 in | Wt 203.6 lb

## 2016-09-16 DIAGNOSIS — G4700 Insomnia, unspecified: Secondary | ICD-10-CM

## 2016-09-16 DIAGNOSIS — Z0001 Encounter for general adult medical examination with abnormal findings: Secondary | ICD-10-CM

## 2016-09-16 DIAGNOSIS — E785 Hyperlipidemia, unspecified: Secondary | ICD-10-CM | POA: Diagnosis not present

## 2016-09-16 DIAGNOSIS — E119 Type 2 diabetes mellitus without complications: Secondary | ICD-10-CM

## 2016-09-16 DIAGNOSIS — E1149 Type 2 diabetes mellitus with other diabetic neurological complication: Secondary | ICD-10-CM | POA: Diagnosis not present

## 2016-09-16 DIAGNOSIS — E1140 Type 2 diabetes mellitus with diabetic neuropathy, unspecified: Secondary | ICD-10-CM

## 2016-09-16 DIAGNOSIS — I10 Essential (primary) hypertension: Secondary | ICD-10-CM | POA: Diagnosis not present

## 2016-09-16 DIAGNOSIS — Z0000 Encounter for general adult medical examination without abnormal findings: Secondary | ICD-10-CM

## 2016-09-16 LAB — URINALYSIS, ROUTINE W REFLEX MICROSCOPIC
Bilirubin, UA: NEGATIVE
Leukocytes, UA: NEGATIVE
Nitrite, UA: NEGATIVE
PH UA: 5.5 (ref 5.0–7.5)
PROTEIN UA: NEGATIVE
Specific Gravity, UA: 1.02 (ref 1.005–1.030)
UUROB: 0.2 mg/dL (ref 0.2–1.0)

## 2016-09-16 LAB — MICROSCOPIC EXAMINATION: WBC, UA: NONE SEEN /hpf (ref 0–?)

## 2016-09-16 LAB — BAYER DCA HB A1C WAIVED: HB A1C (BAYER DCA - WAIVED): 7.2 % — ABNORMAL HIGH (ref <7.0)

## 2016-09-16 MED ORDER — EMPAGLIFLOZIN 25 MG PO TABS: 25.0000 mg | ORAL_TABLET | Freq: Every day | ORAL | 12 refills | Status: DC

## 2016-09-16 MED ORDER — LIRAGLUTIDE 18 MG/3ML ~~LOC~~ SOPN: 1.8000 mg | PEN_INJECTOR | Freq: Every day | SUBCUTANEOUS | 12 refills | Status: DC

## 2016-09-16 MED ORDER — BENAZEPRIL HCL 40 MG PO TABS: 40.0000 mg | ORAL_TABLET | Freq: Every day | ORAL | 12 refills | Status: DC

## 2016-09-16 MED ORDER — METFORMIN HCL 500 MG PO TABS: 1000.0000 mg | ORAL_TABLET | Freq: Two times a day (BID) | ORAL | 12 refills | Status: DC

## 2016-09-16 MED ORDER — SILDENAFIL CITRATE 100 MG PO TABS: 100.0000 mg | ORAL_TABLET | Freq: Every day | ORAL | 12 refills | Status: DC | PRN

## 2016-09-16 MED ORDER — PIOGLITAZONE HCL 30 MG PO TABS: 30.0000 mg | ORAL_TABLET | Freq: Every day | ORAL | 12 refills | Status: DC

## 2016-09-16 MED ORDER — ROSUVASTATIN CALCIUM 40 MG PO TABS: 40.0000 mg | ORAL_TABLET | Freq: Every day | ORAL | 12 refills | Status: DC

## 2016-09-16 MED ORDER — TRAZODONE HCL 50 MG PO TABS: 50.0000 mg | ORAL_TABLET | Freq: Every day | ORAL | 12 refills | Status: DC

## 2016-09-16 MED ORDER — AMLODIPINE BESYLATE 5 MG PO TABS: 5.0000 mg | ORAL_TABLET | Freq: Every day | ORAL | 12 refills | Status: DC

## 2016-09-16 MED ORDER — DULOXETINE HCL 60 MG PO CPEP: 60.0000 mg | ORAL_CAPSULE | Freq: Every day | ORAL | 12 refills | Status: DC

## 2016-09-16 NOTE — Assessment & Plan Note (Signed)
The current medical regimen is effective;  continue present plan and medications.  

## 2016-09-16 NOTE — Assessment & Plan Note (Signed)
Discuss limitations of Ambien nature of habit-forming drugs and limitations.

## 2016-09-16 NOTE — Assessment & Plan Note (Signed)
Discussed diabetes poor control long-term will increase medications go with Victoza patient demonstrated self injection today in the office will increase by 0.6

## 2016-09-16 NOTE — Progress Notes (Signed)
BP 130/86 (BP Location: Left Arm, Patient Position: Sitting, Cuff Size: Large)   Pulse 73   Temp 98.6 F (37 C)   Ht 5\' 10"  (1.778 m)   Wt 203 lb 9.6 oz (92.4 kg)   SpO2 99%   BMI 29.21 kg/m    Subjective:    Patient ID: Mark Allen, male    DOB: 1951-06-30, 65 y.o.   MRN: [id number]  HPI: Mark Allen is a 65 y.o. male  Chief Complaint  Patient presents with  . Annual Exam   Patient for follow-up physical reviewing insomnia. Patient taking trazodone takes 25 mg to 50 mg nearly every night does okay has Ambien prescription that uses only on occasions for extra nights in a bottle of 30 will last 2-3 months. Diabetes doing well fasting this morning was 118. Otherwise noted low blood sugar spells or problems with medications. Blood pressure good control no complaints or issues. Cholesterol doing well with no complaints or issues. Viagra no complaints Taking duloxetine for feet has good days with knots a good days all in all doing okay. Relevant past medical, surgical, family and social history reviewed and updated as indicated. Interim medical history since our last visit reviewed. Allergies and medications reviewed and updated.  Review of Systems  Constitutional: Negative.   HENT: Negative.   Eyes: Negative.   Respiratory: Negative.   Cardiovascular: Negative.   Gastrointestinal: Negative.   Endocrine: Negative.   Genitourinary: Negative.   Musculoskeletal: Negative.   Skin: Negative.   Allergic/Immunologic: Negative.   Neurological: Negative.   Hematological: Negative.   Psychiatric/Behavioral: Negative.     Per HPI unless specifically indicated above     Objective:    BP 130/86 (BP Location: Left Arm, Patient Position: Sitting, Cuff Size: Large)   Pulse 73   Temp 98.6 F (37 C)   Ht 5\' 10"  (1.778 m)   Wt 203 lb 9.6 oz (92.4 kg)   SpO2 99%   BMI 29.21 kg/m   Wt Readings from Last 3 Encounters:  09/16/16 203 lb 9.6 oz (92.4 kg)  05/22/16 208 lb 9.6  oz (94.6 kg)  02/21/16 203 lb (92.1 kg)    Physical Exam  Constitutional: He is oriented to person, place, and time. He appears well-developed and well-nourished.  HENT:  Head: Normocephalic and atraumatic.  Right Ear: External ear normal.  Left Ear: External ear normal.  Eyes: Conjunctivae and EOM are normal. Pupils are equal, round, and reactive to light.  Neck: Normal range of motion. Neck supple.  Cardiovascular: Normal rate, regular rhythm, normal heart sounds and intact distal pulses.   Pulmonary/Chest: Effort normal and breath sounds normal.  Abdominal: Soft. Bowel sounds are normal. There is no splenomegaly or hepatomegaly.  Genitourinary: Rectum normal, prostate normal and penis normal.  Musculoskeletal: Normal range of motion.  Neurological: He is alert and oriented to person, place, and time. He has normal reflexes.  Skin: No rash noted. No erythema.  Psychiatric: He has a normal mood and affect. His behavior is normal. Judgment and thought content normal.    Results for orders placed or performed in visit on 08/04/16  HM DIABETES EYE EXAM  Result Value Ref Range   HM Diabetic Eye Exam No Retinopathy No Retinopathy      Assessment & Plan:   Problem List Items Addressed This Visit      Cardiovascular and Mediastinum   Hypertension    The current medical regimen is effective;  continue present plan and  medications.       Relevant Medications   sildenafil (VIAGRA) 100 MG tablet   rosuvastatin (CRESTOR) 40 MG tablet   benazepril (LOTENSIN) 40 MG tablet   amLODipine (NORVASC) 5 MG tablet   Other Relevant Orders   CBC with Differential/Platelet   Comprehensive metabolic panel     Endocrine   Type II diabetes mellitus with neurological manifestations ([place])    Discussed diabetes poor control long-term will increase medications go with Victoza patient demonstrated self injection today in the office will increase by 0.6      Relevant Medications   rosuvastatin  (CRESTOR) 40 MG tablet   pioglitazone (ACTOS) 30 MG tablet   metFORMIN (GLUCOPHAGE) 500 MG tablet   empagliflozin (JARDIANCE) 25 MG TABS tablet   benazepril (LOTENSIN) 40 MG tablet   liraglutide 18 MG/3ML SOPN     Other   Hyperlipidemia    The current medical regimen is effective;  continue present plan and medications.       Relevant Medications   sildenafil (VIAGRA) 100 MG tablet   rosuvastatin (CRESTOR) 40 MG tablet   benazepril (LOTENSIN) 40 MG tablet   amLODipine (NORVASC) 5 MG tablet   Other Relevant Orders   Lipid Panel w/o Chol/HDL Ratio   Insomnia    Discuss limitations of Ambien nature of habit-forming drugs and limitations.      Relevant Medications   traZODone (DESYREL) 50 MG tablet    Other Visit Diagnoses    Type 2 diabetes mellitus with diabetic neuropathy, unspecified long term insulin use status (HCC)    -  Primary   Relevant Medications   rosuvastatin (CRESTOR) 40 MG tablet   pioglitazone (ACTOS) 30 MG tablet   metFORMIN (GLUCOPHAGE) 500 MG tablet   empagliflozin (JARDIANCE) 25 MG TABS tablet   DULoxetine (CYMBALTA) 60 MG capsule   benazepril (LOTENSIN) 40 MG tablet   liraglutide 18 MG/3ML SOPN   Other Relevant Orders   Bayer DCA Hb A1c Waived   Routine general medical examination at a health care facility       Relevant Orders   CBC with Differential/Platelet   Comprehensive metabolic panel   PSA   TSH   Urinalysis, Routine w reflex microscopic   Diabetes mellitus without complication (HCC)       Relevant Medications   rosuvastatin (CRESTOR) 40 MG tablet   pioglitazone (ACTOS) 30 MG tablet   metFORMIN (GLUCOPHAGE) 500 MG tablet   empagliflozin (JARDIANCE) 25 MG TABS tablet   benazepril (LOTENSIN) 40 MG tablet   liraglutide 18 MG/3ML SOPN       Follow up plan: Return for Hemoglobin A1c.

## 2016-09-17 ENCOUNTER — Encounter: Payer: Self-pay | Admitting: Family Medicine

## 2016-09-17 LAB — CBC WITH DIFFERENTIAL/PLATELET
BASOS: 0 %
Basophils Absolute: 0 10*3/uL (ref 0.0–0.2)
EOS (ABSOLUTE): 0.2 10*3/uL (ref 0.0–0.4)
EOS: 2 %
HEMATOCRIT: 44.8 % (ref 37.5–51.0)
HEMOGLOBIN: 15 g/dL (ref 13.0–17.7)
Immature Grans (Abs): 0 10*3/uL (ref 0.0–0.1)
Immature Granulocytes: 0 %
LYMPHS ABS: 2.1 10*3/uL (ref 0.7–3.1)
Lymphs: 29 %
MCH: 31.3 pg (ref 26.6–33.0)
MCHC: 33.5 g/dL (ref 31.5–35.7)
MCV: 93 fL (ref 79–97)
MONOCYTES: 11 %
MONOS ABS: 0.8 10*3/uL (ref 0.1–0.9)
NEUTROS ABS: 4.2 10*3/uL (ref 1.4–7.0)
Neutrophils: 58 %
Platelets: 315 10*3/uL (ref 150–379)
RBC: 4.8 x10E6/uL (ref 4.14–5.80)
RDW: 13.2 % (ref 12.3–15.4)
WBC: 7.3 10*3/uL (ref 3.4–10.8)

## 2016-09-17 LAB — COMPREHENSIVE METABOLIC PANEL
A/G RATIO: 1.6 (ref 1.2–2.2)
ALBUMIN: 4.4 g/dL (ref 3.6–4.8)
ALK PHOS: 73 IU/L (ref 39–117)
ALT: 23 IU/L (ref 0–44)
AST: 19 IU/L (ref 0–40)
BUN / CREAT RATIO: 22 (ref 10–24)
BUN: 19 mg/dL (ref 8–27)
Bilirubin Total: 0.3 mg/dL (ref 0.0–1.2)
CO2: 24 mmol/L (ref 18–29)
CREATININE: 0.86 mg/dL (ref 0.76–1.27)
Calcium: 9.3 mg/dL (ref 8.6–10.2)
Chloride: 97 mmol/L (ref 96–106)
GFR calc Af Amer: 107 mL/min/{1.73_m2} (ref >59)
GFR calc non Af Amer: 92 mL/min/{1.73_m2} (ref >59)
GLOBULIN, TOTAL: 2.8 g/dL (ref 1.5–4.5)
Glucose: 109 mg/dL — ABNORMAL HIGH (ref 65–99)
POTASSIUM: 4.9 mmol/L (ref 3.5–5.2)
SODIUM: 138 mmol/L (ref 134–144)
Total Protein: 7.2 g/dL (ref 6.0–8.5)

## 2016-09-17 LAB — TSH: TSH: 1.65 u[IU]/mL (ref 0.450–4.500)

## 2016-09-17 LAB — LIPID PANEL W/O CHOL/HDL RATIO
CHOLESTEROL TOTAL: 150 mg/dL (ref 100–199)
HDL: 61 mg/dL (ref >39)
LDL CALC: 76 mg/dL (ref 0–99)
Triglycerides: 65 mg/dL (ref 0–149)
VLDL Cholesterol Cal: 13 mg/dL (ref 5–40)

## 2016-09-17 LAB — PSA: Prostate Specific Ag, Serum: 0.3 ng/mL (ref 0.0–4.0)

## 2016-10-16 ENCOUNTER — Emergency Department
Admission: EM | Admit: 2016-10-16 | Discharge: 2016-10-16 | Disposition: A | Discharge status: Home / Self Care | Payer: BLUE CROSS/BLUE SHIELD | Attending: Emergency Medicine | Admitting: Emergency Medicine

## 2016-10-16 ENCOUNTER — Emergency Department: Payer: BLUE CROSS/BLUE SHIELD

## 2016-10-16 ENCOUNTER — Encounter: Payer: Self-pay | Admitting: Emergency Medicine

## 2016-10-16 DIAGNOSIS — W2209XA Striking against other stationary object, initial encounter: Secondary | ICD-10-CM | POA: Insufficient documentation

## 2016-10-16 DIAGNOSIS — Y999 Unspecified external cause status: Secondary | ICD-10-CM | POA: Insufficient documentation

## 2016-10-16 DIAGNOSIS — S62645A Nondisplaced fracture of proximal phalanx of left ring finger, initial encounter for closed fracture: Secondary | ICD-10-CM | POA: Diagnosis not present

## 2016-10-16 DIAGNOSIS — Z79899 Other long term (current) drug therapy: Secondary | ICD-10-CM | POA: Insufficient documentation

## 2016-10-16 DIAGNOSIS — S61213A Laceration without foreign body of left middle finger without damage to nail, initial encounter: Secondary | ICD-10-CM

## 2016-10-16 DIAGNOSIS — Z7984 Long term (current) use of oral hypoglycemic drugs: Secondary | ICD-10-CM | POA: Diagnosis not present

## 2016-10-16 DIAGNOSIS — I10 Essential (primary) hypertension: Secondary | ICD-10-CM | POA: Diagnosis not present

## 2016-10-16 DIAGNOSIS — S6010XA Contusion of unspecified finger with damage to nail, initial encounter: Secondary | ICD-10-CM

## 2016-10-16 DIAGNOSIS — E119 Type 2 diabetes mellitus without complications: Secondary | ICD-10-CM | POA: Insufficient documentation

## 2016-10-16 DIAGNOSIS — Y9389 Activity, other specified: Secondary | ICD-10-CM | POA: Insufficient documentation

## 2016-10-16 DIAGNOSIS — S6992XA Unspecified injury of left wrist, hand and finger(s), initial encounter: Secondary | ICD-10-CM | POA: Diagnosis present

## 2016-10-16 DIAGNOSIS — Y929 Unspecified place or not applicable: Secondary | ICD-10-CM | POA: Diagnosis not present

## 2016-10-16 DIAGNOSIS — Z87891 Personal history of nicotine dependence: Secondary | ICD-10-CM | POA: Insufficient documentation

## 2016-10-16 DIAGNOSIS — S62639A Displaced fracture of distal phalanx of unspecified finger, initial encounter for closed fracture: Secondary | ICD-10-CM

## 2016-10-16 MED ORDER — CEPHALEXIN 500 MG PO CAPS
500.0000 mg | ORAL_CAPSULE | Freq: Two times a day (BID) | ORAL | 0 refills | Status: AC
Start: 2016-10-16 — End: 2016-10-26

## 2016-10-16 NOTE — ED Triage Notes (Signed)
Pt reports injuring left middle finger yesterday while working yesterday. No active bleeding.

## 2016-10-16 NOTE — ED Provider Notes (Signed)
[facility] Emergency Department Provider Note  ____________________________________________  Time seen: Approximately 3:35 PM  I have reviewed the triage vital signs and the nursing notes.   HISTORY  Chief Complaint Finger Injury    HPI Mark Allen is a 66 y.o. male that presents to emergency department with finger injury that happened yesterday.. Patient hit hand on metal sign out of car window. Patient states he has a laceration to 3rd left finger and swelling and bruising to the fourth finger. Patient states he is able to move fingers normally. No sensation changes. No additional injuries. Patient has not taken anything for pain. Tetanus is up-to-date.   Past Medical History:  Diagnosis Date  . Calculus of kidney   . Hyperlipidemia   . Hypertension   . Impotence, organic   . Insomnia   . Testicular hypofunction   . Type II diabetes mellitus with neurological manifestations [facility])     Patient Active Problem List   Diagnosis Date Noted  . Insomnia 05/29/2015  . Type II diabetes mellitus with neurological manifestations ([place])   . Hypertension   . Hyperlipidemia     Past Surgical History:  Procedure Laterality Date  . NASAL SINUS SURGERY  2004  . REFRACTIVE SURGERY Bilateral   . SPLENECTOMY  1963  . TONSILLECTOMY      Prior to Admission medications   Medication Sig Start Date End Date Taking? Authorizing Provider  amLODipine (NORVASC) 5 MG tablet Take 1 tablet (5 mg total) by mouth daily. 09/16/16   [name], MD  benazepril (LOTENSIN) 40 MG tablet Take 1 tablet (40 mg total) by mouth daily. 09/16/16   [name], MD  cephALEXin (KEFLEX) 500 MG capsule Take 1 capsule (500 mg total) by mouth 2 (two) times daily. 10/16/16 10/26/16  Laban Emperor, PA-C  DULoxetine (CYMBALTA) 60 MG capsule Take 1 capsule (60 mg total) by mouth daily. Start after 30 mg pills were used 09/16/16   [name], MD  empagliflozin (JARDIANCE) 25 MG TABS  tablet Take 25 mg by mouth daily. 09/16/16   [name], MD  liraglutide 18 MG/3ML SOPN Inject 0.3 mLs (1.8 mg total) into the skin daily. 09/16/16   [name], MD  metFORMIN (GLUCOPHAGE) 500 MG tablet Take 2 tablets (1,000 mg total) by mouth 2 (two) times daily with a meal. 09/16/16   [name], MD  Omega 3 1000 MG CAPS Take by mouth 2 (two) times daily.    Historical Provider, MD  ONE TOUCH ULTRA TEST test strip TEST EACH DAY 07/14/16   [name], MD  pioglitazone (ACTOS) 30 MG tablet Take 1 tablet (30 mg total) by mouth daily. 09/16/16   [name], MD  rosuvastatin (CRESTOR) 40 MG tablet Take 1 tablet (40 mg total) by mouth daily. 09/16/16   [name], MD  sildenafil (VIAGRA) 100 MG tablet Take 1 tablet (100 mg total) by mouth daily as needed for erectile dysfunction. 09/16/16   [name], MD  traZODone (DESYREL) 50 MG tablet Take 1 tablet (50 mg total) by mouth at bedtime. 09/16/16   [name], MD  zolpidem (AMBIEN) 10 MG tablet Take 1 tablet (10 mg total) by mouth at bedtime as needed for sleep. 09/01/16   [name], MD    Allergies Aspirin and Sulfa antibiotics  Family History  Problem Relation Age of Onset  . Cancer Father     Social History Social History  Substance Use Topics  . Smoking status: Former Smoker    Types: Cigarettes    Quit date: 01/27/1989  . Smokeless tobacco: Former Systems developer    Types: Chew    Quit date: 09/29/1990  . Alcohol use 0.0 oz/week     Comment: rare     Review of Systems  Constitutional: No fever/chills ENT: No upper respiratory complaints. Cardiovascular: No chest pain. Respiratory:  No SOB. Gastrointestinal: No abdominal pain.  No nausea, no vomiting.  Neurological: Negative for headaches, numbness or tingling   ____________________________________________   PHYSICAL EXAM:  VITAL SIGNS: ED Triage Vitals  Enc Vitals Group     BP 10/16/16 1452 122/75     Pulse Rate 10/16/16 1452 78      Resp 10/16/16 1452 18     Temp 10/16/16 1452 98.4 F (36.9 C)     Temp Source 10/16/16 1452 Oral     SpO2 10/16/16 1452 97 %     Weight 10/16/16 1453 196 lb (88.9 kg)     Height 10/16/16 1453 5\' 10"  (1.778 m)     Head Circumference --      Peak Flow --      Pain Score 10/16/16 1453 2     Pain Loc --      Pain Edu? --      Excl. in [place]? --      Constitutional: Alert and oriented. Well appearing and in no acute distress. Eyes: Conjunctivae are normal. PERRL. EOMI. Head: Atraumatic. ENT:      Ears:      Nose: No congestion/rhinnorhea.      Mouth/Throat: Mucous membranes are moist.  Neck: No stridor.   Cardiovascular: Normal rate, regular rhythm. Normal S1 and S2.  Good peripheral circulation. 2+ radial pulses. Respiratory: Normal respiratory effort without tachypnea or retractions. Lungs CTAB. Good air entry to the bases with no decreased or absent breath sounds. Musculoskeletal: Full range of motion to all extremities. No gross deformities appreciated. Neurologic:  Normal speech and language. No gross focal neurologic deficits are appreciated.  Sensation of fingers are intact. Skin:  Skin is warm, dry. Blood under fourth left fingernail. Bruising and swelling over pad of finger. 1 cm curved incision on pad of third finger. Psychiatric: Mood and affect are normal. Speech and behavior are normal. Patient exhibits appropriate insight and judgement.   ____________________________________________   LABS (all labs ordered are listed, but only abnormal results are displayed)  Labs Reviewed - No data to display ____________________________________________  EKG   ____________________________________________  RADIOLOGY Robinette Haines, personally viewed and evaluated these images (plain radiographs) as part of my medical decision making, as well as reviewing the written report by the radiologist.  Dg Hand Complete Left  Result Date: 10/16/2016 CLINICAL DATA:  Left hand pain  after abrasion and laceration of the fingers EXAM: LEFT HAND - COMPLETE 3+ VIEW COMPARISON:  None. FINDINGS: Acute, closed, tuft fracture of the left ring finger without significant displacement. Soft tissue laceration along the volar aspect of the left middle finger. Joint spaces are maintained. No radiopaque foreign body. A metallic ring obscures the mid shaft of the fourth proximal phalanx. Carpal rows are maintained. Distal radius and ulna are unremarkable. IMPRESSION: Acute, nondisplaced closed fracture of the tuft of the left ring finger. Laceration of the middle finger without underlying osseous abnormality. No radiopaque foreign body. Electronically Signed   By: Sarin [place] Royalty M.D.   On: 10/16/2016 16:35    ____________________________________________    PROCEDURES  Procedure(s) performed:    Procedures  Trephination performed on fourth fingernail.  Medications - No data to display   ____________________________________________   INITIAL IMPRESSION / ASSESSMENT AND PLAN / ED COURSE  Pertinent labs & imaging results that were available during my care of the patient were reviewed by me and considered in my medical decision making (see chart for details).  Review of the [place] CSRS was performed in accordance of the [place] prior to dispensing any controlled drugs.     Patient's diagnosis is consistent with subungual hematoma, finger laceration, and distal tuft fracture. Exam and vital signs are reassuring. Finger laceration to be closed by secondary intention since injury is greater than 24 hours old. Laceration is mildly bleeding, which is controlled with pressure. I think that risk of infection and giving stitches at this point is high so closure by secondary intention is preferred. Subungual hematoma was drained. Fingers were wrapped and splint was applied. Patient will be discharged home with prescriptions for Keflex. Patient is to follow up with PC, wound care, ortho as directed.  Patient is given ED precautions to return to the ED for any worsening or new symptoms.     ____________________________________________  FINAL CLINICAL IMPRESSION(S) / ED DIAGNOSES  Final diagnoses:  Laceration of left middle finger without foreign body without damage to nail, initial encounter  Closed fracture of tuft of distal phalanx of finger  Subungual hematoma of digit of hand, initial encounter      NEW MEDICATIONS STARTED DURING THIS VISIT:  Discharge Medication List as of 10/16/2016  6:02 PM          This chart was dictated using voice recognition software/Dragon. Despite best efforts to proofread, errors can occur which can change the meaning. Any change was purely unintentional.    Laban Emperor, PA-C 10/16/16 [place], MD 10/16/16 [phone]

## 2016-10-16 NOTE — ED Notes (Signed)
Pt got finger caught in between mirror on car and handicap sign yest, states cut to middle finger, ring finger swollen and bruised. States can move all fingers and still has sensation. No bleeding at this time. Pt states tetanus within 5 years- states he believes he got one at his physical this past year.

## 2016-12-16 ENCOUNTER — Ambulatory Visit: Payer: BLUE CROSS/BLUE SHIELD | Admitting: Family Medicine

## 2016-12-16 ENCOUNTER — Encounter: Payer: Self-pay | Admitting: Family Medicine

## 2016-12-16 ENCOUNTER — Ambulatory Visit (INDEPENDENT_AMBULATORY_CARE_PROVIDER_SITE_OTHER): Payer: BLUE CROSS/BLUE SHIELD | Admitting: Family Medicine

## 2016-12-16 VITALS — BP 114/72 | HR 69 | Ht 70.0 in | Wt 209.9 lb

## 2016-12-16 DIAGNOSIS — E785 Hyperlipidemia, unspecified: Secondary | ICD-10-CM | POA: Diagnosis not present

## 2016-12-16 DIAGNOSIS — I10 Essential (primary) hypertension: Secondary | ICD-10-CM | POA: Diagnosis not present

## 2016-12-16 DIAGNOSIS — E1149 Type 2 diabetes mellitus with other diabetic neurological complication: Secondary | ICD-10-CM

## 2016-12-16 NOTE — Progress Notes (Signed)
BP 114/72   Pulse 69   Ht 5\' 10"  (1.778 m)   Wt 209 lb 14.4 oz (95.2 kg)   SpO2 95%   BMI 30.12 kg/m    Subjective:    Patient ID: Mark Allen, male    DOB: [birth year], 66 y.o.   MRN: [id number]  HPI: Mark Allen is a 66 y.o. male  Chief Complaint  Patient presents with  . Follow-up  . Diabetes  Patient follow-up doing well with diabetes noted low blood sugar spells use of Victoza has really helped with good control of blood sugar. Taking medication faithfully without problems had some nausea at first but that is gone away completely. Blood pressure doing well without problems. Cholesterol doing well without problems.  Relevant past medical, surgical, family and social history reviewed and updated as indicated. Interim medical history since our last visit reviewed. Allergies and medications reviewed and updated.  Review of Systems  Constitutional: Negative.   Respiratory: Negative.   Cardiovascular: Negative.     Per HPI unless specifically indicated above     Objective:    BP 114/72   Pulse 69   Ht 5\' 10"  (1.778 m)   Wt 209 lb 14.4 oz (95.2 kg)   SpO2 95%   BMI 30.12 kg/m   Wt Readings from Last 3 Encounters:  12/16/16 209 lb 14.4 oz (95.2 kg)  10/16/16 196 lb (88.9 kg)  09/16/16 203 lb 9.6 oz (92.4 kg)    Physical Exam  Constitutional: He is oriented to person, place, and time. He appears well-developed and well-nourished.  HENT:  Head: Normocephalic and atraumatic.  Eyes: Conjunctivae and EOM are normal.  Neck: Normal range of motion.  Cardiovascular: Normal rate, regular rhythm and normal heart sounds.   Pulmonary/Chest: Effort normal and breath sounds normal.  Musculoskeletal: Normal range of motion.  Neurological: He is alert and oriented to person, place, and time.  Skin: No erythema.  Psychiatric: He has a normal mood and affect. His behavior is normal. Judgment and thought content normal.    Results for orders placed or performed in visit  on 09/16/16  Microscopic Examination  Result Value Ref Range   WBC, UA None seen 0 - 5 /hpf   RBC, UA 0-2 0 - 2 /hpf   Epithelial Cells (non renal) 0-10 0 - 10 /hpf  Bayer DCA Hb A1c Waived  Result Value Ref Range   Bayer DCA Hb A1c Waived 7.2 (H) <7.0 %  CBC with Differential/Platelet  Result Value Ref Range   WBC 7.3 3.4 - 10.8 x10E3/uL   RBC 4.80 4.14 - 5.80 x10E6/uL   Hemoglobin 15.0 13.0 - 17.7 g/dL   Hematocrit 44.8 37.5 - 51.0 %   MCV 93 79 - 97 fL   MCH 31.3 26.6 - 33.0 pg   MCHC 33.5 31.5 - 35.7 g/dL   RDW 13.2 12.3 - 15.4 %   Platelets 315 150 - 379 x10E3/uL   Neutrophils 58 Not Estab. %   Lymphs 29 Not Estab. %   Monocytes 11 Not Estab. %   Eos 2 Not Estab. %   Basos 0 Not Estab. %   Neutrophils Absolute 4.2 1.4 - 7.0 x10E3/uL   Lymphocytes Absolute 2.1 0.7 - 3.1 x10E3/uL   Monocytes Absolute 0.8 0.1 - 0.9 x10E3/uL   EOS (ABSOLUTE) 0.2 0.0 - 0.4 x10E3/uL   Basophils Absolute 0.0 0.0 - 0.2 x10E3/uL   Immature Granulocytes 0 Not Estab. %   Immature Grans (Abs) 0.0  0.0 - 0.1 x10E3/uL  Comprehensive metabolic panel  Result Value Ref Range   Glucose 109 (H) 65 - 99 mg/dL   BUN 19 8 - 27 mg/dL   Creatinine, Ser 0.86 0.76 - 1.27 mg/dL   GFR calc non Af Amer 92 >59 mL/min/1.73   GFR calc Af Amer 107 >59 mL/min/1.73   BUN/Creatinine Ratio 22 10 - 24   Sodium 138 134 - 144 mmol/L   Potassium 4.9 3.5 - 5.2 mmol/L   Chloride 97 96 - 106 mmol/L   CO2 24 18 - 29 mmol/L   Calcium 9.3 8.6 - 10.2 mg/dL   Total Protein 7.2 6.0 - 8.5 g/dL   Albumin 4.4 3.6 - 4.8 g/dL   Globulin, Total 2.8 1.5 - 4.5 g/dL   Albumin/Globulin Ratio 1.6 1.2 - 2.2   Bilirubin Total 0.3 0.0 - 1.2 mg/dL   Alkaline Phosphatase 73 39 - 117 IU/L   AST 19 0 - 40 IU/L   ALT 23 0 - 44 IU/L  Lipid Panel w/o Chol/HDL Ratio  Result Value Ref Range   Cholesterol, Total 150 100 - 199 mg/dL   Triglycerides 65 0 - 149 mg/dL   HDL 61 >39 mg/dL   VLDL Cholesterol Cal 13 5 - 40 mg/dL   LDL Calculated 76 0  - 99 mg/dL  PSA  Result Value Ref Range   Prostate Specific Ag, Serum 0.3 0.0 - 4.0 ng/mL  TSH  Result Value Ref Range   TSH 1.650 0.450 - 4.500 uIU/mL  Urinalysis, Routine w reflex microscopic  Result Value Ref Range   Specific Gravity, UA 1.020 1.005 - 1.030   pH, UA 5.5 5.0 - 7.5   Color, UA Yellow Yellow   Appearance Ur Clear Clear   Leukocytes, UA Negative Negative   Protein, UA Negative Negative/Trace   Glucose, UA 3+ (A) Negative   Ketones, UA 2+ (A) Negative   RBC, UA Trace (A) Negative   Bilirubin, UA Negative Negative   Urobilinogen, Ur 0.2 0.2 - 1.0 mg/dL   Nitrite, UA Negative Negative   Microscopic Examination See below:       Assessment & Plan:   Problem List Items Addressed This Visit      Cardiovascular and Mediastinum   Hypertension - Primary    The current medical regimen is effective;  continue present plan and medications.       Relevant Orders   Hemoglobin A1c     Endocrine   Type II diabetes mellitus with neurological manifestations ([place])    The current medical regimen is effective;  continue present plan and medications.       Relevant Orders   Hemoglobin A1c     Other   Hyperlipidemia    The current medical regimen is effective;  continue present plan and medications.       Relevant Orders   Hemoglobin A1c       Follow up plan: Return in about 3 months (around 03/18/2017) for Hemoglobin A1c, BMP,  Lipids, ALT, AST.

## 2016-12-16 NOTE — Assessment & Plan Note (Signed)
The current medical regimen is effective;  continue present plan and medications.  

## 2016-12-17 LAB — HEMOGLOBIN A1C
ESTIMATED AVERAGE GLUCOSE: 146 mg/dL
Hgb A1c MFr Bld: 6.7 % — ABNORMAL HIGH (ref 4.8–5.6)

## 2017-01-06 ENCOUNTER — Other Ambulatory Visit: Payer: Self-pay | Admitting: Family Medicine

## 2017-01-06 DIAGNOSIS — G4700 Insomnia, unspecified: Secondary | ICD-10-CM

## 2017-03-23 ENCOUNTER — Encounter: Payer: Self-pay | Admitting: Family Medicine

## 2017-03-23 ENCOUNTER — Ambulatory Visit (INDEPENDENT_AMBULATORY_CARE_PROVIDER_SITE_OTHER): Payer: BLUE CROSS/BLUE SHIELD | Admitting: Family Medicine

## 2017-03-23 VITALS — BP 110/70 | HR 73 | Wt 200.0 lb

## 2017-03-23 DIAGNOSIS — I10 Essential (primary) hypertension: Secondary | ICD-10-CM | POA: Diagnosis not present

## 2017-03-23 DIAGNOSIS — E1149 Type 2 diabetes mellitus with other diabetic neurological complication: Secondary | ICD-10-CM

## 2017-03-23 DIAGNOSIS — E785 Hyperlipidemia, unspecified: Secondary | ICD-10-CM | POA: Diagnosis not present

## 2017-03-23 MED ORDER — DULAGLUTIDE 1.5 MG/0.5ML ~~LOC~~ SOAJ: 1.5000 mg | SUBCUTANEOUS | 11 refills | Status: DC

## 2017-03-23 NOTE — Assessment & Plan Note (Signed)
The current medical regimen is effective;  continue present plan and medications.  

## 2017-03-23 NOTE — Assessment & Plan Note (Signed)
Rate control with weight loss

## 2017-03-23 NOTE — Assessment & Plan Note (Addendum)
The current medical regimen is effective;  continue present plan and medications. Neuropathy symptoms are currently resolved will stay off Cymbalta For dosing simplicity will change to Trulicity observe response will started for old dose but warnings about nausea given.

## 2017-03-23 NOTE — Progress Notes (Signed)
BP 110/70   Pulse 73   Wt 200 lb (90.7 kg)   SpO2 96%   BMI 28.70 kg/m    Subjective:    Patient ID: Mark Allen, male    DOB: 10-16-1950, 66 y.o.   MRN: [id number]  HPI: Mark Allen is a 66 y.o. male  Chief Complaint  Patient presents with  . Follow-up  . Diabetes  . Hypertension  . Hyperlipidemia  Patient follow-up doing well with cholesterol blood pressure and diabetes medications no issues noted low blood sugar spells taking medications faithfully. Has been able to stop Cymbalta and feet aren't hurting will continue to stay off Cymbalta. Is interested in Trulicity instead of Victoza as once weekly shot will occasionally miss Victoza.  Patient currently not using Ambien which is good does trazodone half a tablet at nighttime and sleeping well. Relevant past medical, surgical, family and social history reviewed and updated as indicated. Interim medical history since our last visit reviewed. Allergies and medications reviewed and updated.  Review of Systems  Constitutional: Negative.   Respiratory: Negative.   Cardiovascular: Negative.     Per HPI unless specifically indicated above     Objective:    BP 110/70   Pulse 73   Wt 200 lb (90.7 kg)   SpO2 96%   BMI 28.70 kg/m   Wt Readings from Last 3 Encounters:  03/23/17 200 lb (90.7 kg)  12/16/16 209 lb 14.4 oz (95.2 kg)  10/16/16 196 lb (88.9 kg)    Physical Exam  Constitutional: He is oriented to person, place, and time. He appears well-developed and well-nourished.  HENT:  Head: Normocephalic and atraumatic.  Eyes: Conjunctivae and EOM are normal.  Neck: Normal range of motion.  Cardiovascular: Normal rate, regular rhythm and normal heart sounds.   Pulmonary/Chest: Effort normal and breath sounds normal.  Musculoskeletal: Normal range of motion.  Neurological: He is alert and oriented to person, place, and time.  Skin: No erythema.  Psychiatric: He has a normal mood and affect. His behavior is  normal. Judgment and thought content normal.    Results for orders placed or performed in visit on 12/16/16  Hemoglobin A1c  Result Value Ref Range   Hgb A1c MFr Bld 6.7 (H) 4.8 - 5.6 %   Est. average glucose Bld gHb Est-mCnc 146 mg/dL      Assessment & Plan:   Problem List Items Addressed This Visit      Cardiovascular and Mediastinum   Hypertension - Primary    The current medical regimen is effective;  continue present plan and medications.       Relevant Orders   Basic metabolic panel   LP+ALT+AST Pic[place], Waived   Bayer DCA Hb A1c Waived     Endocrine   Type II diabetes mellitus with neurological manifestations ([place])    The current medical regimen is effective;  continue present plan and medications. Neuropathy symptoms are currently resolved will stay off Cymbalta For dosing simplicity will change to Trulicity observe response will started for old dose but warnings about nausea given.      Relevant Medications   Dulaglutide (TRULICITY) 1.5 EU/2.3NT SOPN   Other Relevant Orders   Basic metabolic panel   LP+ALT+AST Piccolo, Waived   Bayer DCA Hb A1c Waived     Other   Hyperlipidemia    Rate control with weight loss      Relevant Orders   Basic metabolic panel   LP+ALT+AST Piccolo, [employer]  DCA Hb A1c Waived       Follow up plan: Return in about 3 months (around 06/23/2017) for Hemoglobin A1c.

## 2017-03-24 ENCOUNTER — Telehealth: Payer: Self-pay

## 2017-03-24 LAB — LP+ALT+AST PICCOLO, WAIVED
ALT (SGPT) Piccolo, Waived: 20 U/L (ref 10–47)
AST (SGOT) Piccolo, Waived: 30 U/L (ref 11–38)
Chol/HDL Ratio Piccolo,Waive: 2.3 mg/dL
Cholesterol Piccolo, Waived: 145 mg/dL (ref <200)
HDL CHOL PICCOLO, WAIVED: 63 mg/dL (ref >59)
LDL CHOL CALC PICCOLO WAIVED: 67 mg/dL (ref <100)
TRIGLYCERIDES PICCOLO,WAIVED: 74 mg/dL (ref <150)
VLDL CHOL CALC PICCOLO,WAIVE: 15 mg/dL (ref <30)

## 2017-03-24 LAB — BAYER DCA HB A1C WAIVED: HB A1C (BAYER DCA - WAIVED): 6.3 % (ref <7.0)

## 2017-03-24 NOTE — Telephone Encounter (Signed)
P.A. For Trulicity was initiated and approved via covermymeds Key# [id number]. From 03/24/17-09/28/38. Faxed to pharmacy.

## 2017-03-27 LAB — BASIC METABOLIC PANEL

## 2017-03-30 ENCOUNTER — Encounter: Payer: Self-pay | Admitting: Family Medicine

## 2017-05-29 ENCOUNTER — Other Ambulatory Visit: Payer: Self-pay | Admitting: Family Medicine

## 2017-05-29 DIAGNOSIS — G4700 Insomnia, unspecified: Secondary | ICD-10-CM

## 2017-06-23 ENCOUNTER — Ambulatory Visit (INDEPENDENT_AMBULATORY_CARE_PROVIDER_SITE_OTHER): Payer: BLUE CROSS/BLUE SHIELD | Admitting: Family Medicine

## 2017-06-23 VITALS — BP 104/68 | HR 74 | Wt 194.0 lb

## 2017-06-23 DIAGNOSIS — G4700 Insomnia, unspecified: Secondary | ICD-10-CM

## 2017-06-23 DIAGNOSIS — Z23 Encounter for immunization: Secondary | ICD-10-CM | POA: Diagnosis not present

## 2017-06-23 DIAGNOSIS — I10 Essential (primary) hypertension: Secondary | ICD-10-CM | POA: Diagnosis not present

## 2017-06-23 DIAGNOSIS — E1149 Type 2 diabetes mellitus with other diabetic neurological complication: Secondary | ICD-10-CM | POA: Diagnosis not present

## 2017-06-23 DIAGNOSIS — E785 Hyperlipidemia, unspecified: Secondary | ICD-10-CM | POA: Diagnosis not present

## 2017-06-23 NOTE — Assessment & Plan Note (Signed)
Discussed diabetes with nice lowering of A1c the lowest patient's measured ever. Along with weight-loss patient will continue Trulicity. We will stop Actos and observe blood sugar response. We'll continue working on diet exercise nutrition. We will hopefully be up to cut back more. Patient may be having some nausea symptoms with metformin will consider if persistent problem.

## 2017-06-23 NOTE — Assessment & Plan Note (Signed)
The current medical regimen is effective;  continue present plan and medications.  

## 2017-06-23 NOTE — Patient Instructions (Signed)

## 2017-06-23 NOTE — Progress Notes (Signed)
BP 104/68   Pulse 74   Wt 194 lb (88 kg)   SpO2 97%   BMI 27.84 kg/m    Subjective:    Patient ID: Mark Allen, male    DOB: 1951/06/18, 66 y.o.   MRN: [id number]  HPI: Mark Allen is a 66 y.o. male  Chief Complaint  Patient presents with  . Follow-up  . Hypertension  . Diabetes   Patient follow-up all in all doing well no complaints. Good blood pressure control with medications in no side effects. Diabetes doing well with medications no low blood sugar spells. Same with cholesterol. Takes trazodone 50 mg at bedtime for sleep and does well has Ambien prescription hasn't taken yet but keeps on standby. Relevant past medical, surgical, family and social history reviewed and updated as indicated. Interim medical history since our last visit reviewed. Allergies and medications reviewed and updated.  Review of Systems  Constitutional: Negative.   Respiratory: Negative.   Cardiovascular: Negative.     Per HPI unless specifically indicated above     Objective:    BP 104/68   Pulse 74   Wt 194 lb (88 kg)   SpO2 97%   BMI 27.84 kg/m   Wt Readings from Last 3 Encounters:  06/23/17 194 lb (88 kg)  03/23/17 200 lb (90.7 kg)  12/16/16 209 lb 14.4 oz (95.2 kg)    Physical Exam  Constitutional: He is oriented to person, place, and time. He appears well-developed and well-nourished.  HENT:  Head: Normocephalic and atraumatic.  Eyes: Conjunctivae and EOM are normal.  Neck: Normal range of motion.  Cardiovascular: Normal rate, regular rhythm and normal heart sounds.   Pulmonary/Chest: Effort normal and breath sounds normal.  Musculoskeletal: Normal range of motion.  Neurological: He is alert and oriented to person, place, and time.  Skin: No erythema.  Psychiatric: He has a normal mood and affect. His behavior is normal. Judgment and thought content normal.    Results for orders placed or performed in visit on 37/16/96  Basic metabolic panel  Result Value Ref  Range   Glucose CANCELED mg/dL   BUN CANCELED    Creatinine, Ser CANCELED    Sodium CANCELED    Potassium CANCELED    Chloride CANCELED    CO2 CANCELED    Calcium CANCELED   LP+ALT+AST Piccolo, Waived  Result Value Ref Range   ALT (SGPT) Piccolo, Waived 20 10 - 47 U/L   AST (SGOT) Piccolo, Waived 30 11 - 38 U/L   Cholesterol Piccolo, Waived 145 <200 mg/dL   HDL Chol Piccolo, Waived 63 >59 mg/dL   Triglycerides Piccolo,Waived 74 <150 mg/dL   Chol/HDL Ratio Piccolo,Waive 2.3 mg/dL   LDL Chol Calc Piccolo Waived 67 <100 mg/dL   VLDL Chol Calc Piccolo,Waive 15 <30 mg/dL  Bayer DCA Hb A1c Waived  Result Value Ref Range   Bayer DCA Hb A1c Waived 6.3 <7.0 %      Assessment & Plan:   Problem List Items Addressed This Visit      Cardiovascular and Mediastinum   Hypertension    The current medical regimen is effective;  continue present plan and medications.       Relevant Orders   Bayer DCA Hb A1c Waived     Endocrine   Type II diabetes mellitus with neurological manifestations ([place]) - Primary    Discussed diabetes with nice lowering of A1c the lowest patient's measured ever. Along with weight-loss patient will continue Trulicity. We  will stop Actos and observe blood sugar response. We'll continue working on diet exercise nutrition. We will hopefully be up to cut back more. Patient may be having some nausea symptoms with metformin will consider if persistent problem.      Relevant Orders   Bayer DCA Hb A1c Waived     Other   Hyperlipidemia   Relevant Orders   Bayer DCA Hb A1c Waived   Insomnia    The current medical regimen is effective;  continue present plan and medications.        Other Visit Diagnoses    Needs flu shot       Relevant Orders   Flu Vaccine QUAD 6+ mos PF IM (Fluarix Quad PF) (Completed)       Follow up plan: Return in about 3 months (around 09/22/2017) for Physical Exam, Hemoglobin A1c.

## 2017-06-24 LAB — BAYER DCA HB A1C WAIVED: HB A1C: 5.5 % (ref <7.0)

## 2017-07-30 ENCOUNTER — Other Ambulatory Visit: Payer: Self-pay | Admitting: Family Medicine

## 2017-07-30 DIAGNOSIS — G4700 Insomnia, unspecified: Secondary | ICD-10-CM

## 2017-07-30 NOTE — Telephone Encounter (Signed)
Refill

## 2017-09-23 ENCOUNTER — Other Ambulatory Visit: Payer: Self-pay | Admitting: Family Medicine

## 2017-09-23 DIAGNOSIS — I10 Essential (primary) hypertension: Secondary | ICD-10-CM

## 2017-09-23 DIAGNOSIS — E1140 Type 2 diabetes mellitus with diabetic neuropathy, unspecified: Secondary | ICD-10-CM

## 2017-09-23 DIAGNOSIS — E785 Hyperlipidemia, unspecified: Secondary | ICD-10-CM

## 2017-09-23 NOTE — Telephone Encounter (Signed)
30 day refill; pt appointment 10/24/17

## 2017-09-26 ENCOUNTER — Other Ambulatory Visit: Payer: Self-pay | Admitting: Family Medicine

## 2017-09-26 DIAGNOSIS — E1149 Type 2 diabetes mellitus with other diabetic neurological complication: Secondary | ICD-10-CM

## 2017-09-28 ENCOUNTER — Encounter: Payer: BLUE CROSS/BLUE SHIELD | Admitting: Family Medicine

## 2017-09-30 ENCOUNTER — Other Ambulatory Visit: Payer: Self-pay | Admitting: Family Medicine

## 2017-09-30 DIAGNOSIS — E1140 Type 2 diabetes mellitus with diabetic neuropathy, unspecified: Secondary | ICD-10-CM

## 2017-10-12 ENCOUNTER — Encounter: Payer: Self-pay | Admitting: Family Medicine

## 2017-10-14 ENCOUNTER — Encounter: Payer: BLUE CROSS/BLUE SHIELD | Admitting: Family Medicine

## 2017-10-19 ENCOUNTER — Other Ambulatory Visit: Payer: Self-pay | Admitting: Family Medicine

## 2017-10-19 DIAGNOSIS — I10 Essential (primary) hypertension: Secondary | ICD-10-CM

## 2017-10-28 ENCOUNTER — Other Ambulatory Visit: Payer: Self-pay | Admitting: Family Medicine

## 2017-10-28 DIAGNOSIS — E785 Hyperlipidemia, unspecified: Secondary | ICD-10-CM

## 2017-10-28 DIAGNOSIS — I10 Essential (primary) hypertension: Secondary | ICD-10-CM

## 2017-11-10 ENCOUNTER — Encounter: Payer: Self-pay | Admitting: Family Medicine

## 2017-11-10 ENCOUNTER — Ambulatory Visit: Payer: BLUE CROSS/BLUE SHIELD | Admitting: Family Medicine

## 2017-11-10 VITALS — BP 138/80 | HR 95 | Ht 70.87 in | Wt 198.0 lb

## 2017-11-10 DIAGNOSIS — I10 Essential (primary) hypertension: Secondary | ICD-10-CM

## 2017-11-10 DIAGNOSIS — E1140 Type 2 diabetes mellitus with diabetic neuropathy, unspecified: Secondary | ICD-10-CM

## 2017-11-10 DIAGNOSIS — Z125 Encounter for screening for malignant neoplasm of prostate: Secondary | ICD-10-CM

## 2017-11-10 DIAGNOSIS — Z0000 Encounter for general adult medical examination without abnormal findings: Secondary | ICD-10-CM

## 2017-11-10 DIAGNOSIS — G4700 Insomnia, unspecified: Secondary | ICD-10-CM | POA: Diagnosis not present

## 2017-11-10 DIAGNOSIS — E785 Hyperlipidemia, unspecified: Secondary | ICD-10-CM | POA: Diagnosis not present

## 2017-11-10 DIAGNOSIS — Z0001 Encounter for general adult medical examination with abnormal findings: Secondary | ICD-10-CM | POA: Diagnosis not present

## 2017-11-10 DIAGNOSIS — E1149 Type 2 diabetes mellitus with other diabetic neurological complication: Secondary | ICD-10-CM | POA: Diagnosis not present

## 2017-11-10 DIAGNOSIS — Z1329 Encounter for screening for other suspected endocrine disorder: Secondary | ICD-10-CM

## 2017-11-10 DIAGNOSIS — Z1211 Encounter for screening for malignant neoplasm of colon: Secondary | ICD-10-CM

## 2017-11-10 LAB — URINALYSIS, ROUTINE W REFLEX MICROSCOPIC
BILIRUBIN UA: NEGATIVE
LEUKOCYTES UA: NEGATIVE
Nitrite, UA: NEGATIVE
PROTEIN UA: NEGATIVE
RBC, UA: NEGATIVE
Specific Gravity, UA: 1.02 (ref 1.005–1.030)
Urobilinogen, Ur: 0.2 mg/dL (ref 0.2–1.0)
pH, UA: 5 (ref 5.0–7.5)

## 2017-11-10 LAB — BAYER DCA HB A1C WAIVED: HB A1C (BAYER DCA - WAIVED): 6.8 % (ref <7.0)

## 2017-11-10 MED ORDER — DULAGLUTIDE 1.5 MG/0.5ML ~~LOC~~ SOAJ: 1.5000 mg | SUBCUTANEOUS | 4 refills | Status: DC

## 2017-11-10 MED ORDER — BENAZEPRIL HCL 40 MG PO TABS: 40.0000 mg | ORAL_TABLET | Freq: Every day | ORAL | 4 refills | Status: DC

## 2017-11-10 MED ORDER — METFORMIN HCL 500 MG PO TABS: 1000.0000 mg | ORAL_TABLET | Freq: Every day | ORAL | 4 refills | Status: DC

## 2017-11-10 MED ORDER — EMPAGLIFLOZIN 25 MG PO TABS: 25.0000 mg | ORAL_TABLET | Freq: Every day | ORAL | 4 refills | Status: DC

## 2017-11-10 MED ORDER — ROSUVASTATIN CALCIUM 40 MG PO TABS: 40.0000 mg | ORAL_TABLET | Freq: Every day | ORAL | 4 refills | Status: DC

## 2017-11-10 MED ORDER — AMLODIPINE BESYLATE 5 MG PO TABS: ORAL_TABLET | ORAL | 4 refills | Status: DC

## 2017-11-10 MED ORDER — TRAZODONE HCL 50 MG PO TABS: 50.0000 mg | ORAL_TABLET | Freq: Every day | ORAL | 4 refills | Status: DC

## 2017-11-10 NOTE — Assessment & Plan Note (Addendum)
The current medical regimen is effective;  continue present plan and medications. Discussed not as good as previously will recheck diabetes A1c in 3 months. Discussed diet exercise nutrition

## 2017-11-10 NOTE — Progress Notes (Signed)
BP 138/80 (BP Location: Left Arm)   Pulse 95   Ht 5' 10.87" (1.8 m)   Wt 198 lb (89.8 kg)   SpO2 98%   BMI 27.72 kg/m    Subjective:    Patient ID: Mark Allen, male    DOB: 1951-04-18, 67 y.o.   MRN: [id number]  HPI: Mark Allen is a 67 y.o. male  Annual exam   all in all doing well no complaints for diabetes no low blood sugar spells or issues with medications Is able to get medications without problems. Blood pressure all in all doing well patient has not taken medications today due to work schedule.  Will get later today. Cholesterol doing well. Insomnia doing okay with trazodone.   Relevant past medical, surgical, family and social history reviewed and updated as indicated. Interim medical history since our last visit reviewed. Allergies and medications reviewed and updated.  Review of Systems  Constitutional: Negative.   HENT: Negative.   Eyes: Negative.   Respiratory: Negative.   Cardiovascular: Negative.   Gastrointestinal: Negative.   Endocrine: Negative.   Genitourinary: Negative.   Musculoskeletal: Negative.   Skin: Negative.   Allergic/Immunologic: Negative.   Neurological: Negative.   Hematological: Negative.   Psychiatric/Behavioral: Negative.     Per HPI unless specifically indicated above     Objective:    BP 138/80 (BP Location: Left Arm)   Pulse 95   Ht 5' 10.87" (1.8 m)   Wt 198 lb (89.8 kg)   SpO2 98%   BMI 27.72 kg/m   Wt Readings from Last 3 Encounters:  11/10/17 198 lb (89.8 kg)  06/23/17 194 lb (88 kg)  03/23/17 200 lb (90.7 kg)    Physical Exam  Constitutional: He is oriented to person, place, and time. He appears well-developed and well-nourished.  HENT:  Head: Normocephalic and atraumatic.  Right Ear: External ear normal.  Left Ear: External ear normal.  Eyes: Conjunctivae and EOM are normal. Pupils are equal, round, and reactive to light.  Neck: Normal range of motion. Neck supple.  Cardiovascular: Normal rate,  regular rhythm, normal heart sounds and intact distal pulses.  Pulmonary/Chest: Effort normal and breath sounds normal.  Abdominal: Soft. Bowel sounds are normal. There is no splenomegaly or hepatomegaly.  Genitourinary: Rectum normal, prostate normal and penis normal.  Musculoskeletal: Normal range of motion.  Neurological: He is alert and oriented to person, place, and time. He has normal reflexes.  Skin: No rash noted. No erythema.  Psychiatric: He has a normal mood and affect. His behavior is normal. Judgment and thought content normal.    Results for orders placed or performed in visit on 06/23/17  Bayer DCA Hb A1c Waived  Result Value Ref Range   Bayer DCA Hb A1c Waived 5.5 <7.0 %      Assessment & Plan:   Problem List Items Addressed This Visit      Cardiovascular and Mediastinum   Hypertension - Primary    The current medical regimen is effective;  continue present plan and medications.       Relevant Medications   amLODipine (NORVASC) 5 MG tablet   benazepril (LOTENSIN) 40 MG tablet   rosuvastatin (CRESTOR) 40 MG tablet   Other Relevant Orders   CBC with Differential/Platelet   Comprehensive metabolic panel   Lipid panel   Urinalysis, Routine w reflex microscopic   Bayer DCA Hb A1c Waived     Endocrine   Type II diabetes mellitus with neurological manifestations (  [place])    The current medical regimen is effective;  continue present plan and medications. Discussed not as good as previously will recheck diabetes A1c in 3 months. Discussed diet exercise nutrition       Relevant Medications   benazepril (LOTENSIN) 40 MG tablet   metFORMIN (GLUCOPHAGE) 500 MG tablet   rosuvastatin (CRESTOR) 40 MG tablet   empagliflozin (JARDIANCE) 25 MG TABS tablet   Dulaglutide (TRULICITY) 1.5 XN/1.7GY SOPN   Other Relevant Orders   CBC with Differential/Platelet   Comprehensive metabolic panel   Lipid panel   Urinalysis, Routine w reflex microscopic   Bayer DCA Hb A1c  Waived     Other   Hyperlipidemia    The current medical regimen is effective;  continue present plan and medications.       Relevant Medications   amLODipine (NORVASC) 5 MG tablet   benazepril (LOTENSIN) 40 MG tablet   rosuvastatin (CRESTOR) 40 MG tablet   Other Relevant Orders   CBC with Differential/Platelet   Comprehensive metabolic panel   Lipid panel   Urinalysis, Routine w reflex microscopic   Bayer DCA Hb A1c Waived   Insomnia    The current medical regimen is effective;  continue present plan and medications.       Relevant Medications   traZODone (DESYREL) 50 MG tablet    Other Visit Diagnoses    Prostate cancer screening       Relevant Orders   PSA   Thyroid disorder screen       Relevant Orders   TSH   Type 2 diabetes mellitus with diabetic neuropathy (HCC)       Relevant Medications   benazepril (LOTENSIN) 40 MG tablet   metFORMIN (GLUCOPHAGE) 500 MG tablet   rosuvastatin (CRESTOR) 40 MG tablet   empagliflozin (JARDIANCE) 25 MG TABS tablet   Dulaglutide (TRULICITY) 1.5 FV/4.9SW SOPN   Colon cancer screening       Relevant Orders   Ambulatory referral to Gastroenterology   PE (physical exam), annual           Follow up plan: Return in about 3 months (around 02/07/2018) for Hemoglobin A1c.

## 2017-11-10 NOTE — Assessment & Plan Note (Signed)
The current medical regimen is effective;  continue present plan and medications.  

## 2017-11-11 ENCOUNTER — Encounter: Payer: Self-pay | Admitting: Family Medicine

## 2017-11-11 LAB — CBC WITH DIFFERENTIAL/PLATELET
Basophils Absolute: 0 10*3/uL (ref 0.0–0.2)
Basos: 0 %
EOS (ABSOLUTE): 0.1 10*3/uL (ref 0.0–0.4)
Eos: 1 %
HEMOGLOBIN: 16 g/dL (ref 13.0–17.7)
Hematocrit: 46.9 % (ref 37.5–51.0)
IMMATURE GRANS (ABS): 0 10*3/uL (ref 0.0–0.1)
IMMATURE GRANULOCYTES: 0 %
LYMPHS: 27 %
Lymphocytes Absolute: 1.9 10*3/uL (ref 0.7–3.1)
MCH: 31.2 pg (ref 26.6–33.0)
MCHC: 34.1 g/dL (ref 31.5–35.7)
MCV: 91 fL (ref 79–97)
MONOCYTES: 13 %
Monocytes Absolute: 1 10*3/uL — ABNORMAL HIGH (ref 0.1–0.9)
NEUTROS PCT: 59 %
Neutrophils Absolute: 4.1 10*3/uL (ref 1.4–7.0)
PLATELETS: 353 10*3/uL (ref 150–379)
RBC: 5.13 x10E6/uL (ref 4.14–5.80)
RDW: 13.1 % (ref 12.3–15.4)
WBC: 7.2 10*3/uL (ref 3.4–10.8)

## 2017-11-11 LAB — COMPREHENSIVE METABOLIC PANEL
ALBUMIN: 4.6 g/dL (ref 3.6–4.8)
ALT: 19 IU/L (ref 0–44)
AST: 17 IU/L (ref 0–40)
Albumin/Globulin Ratio: 1.4 (ref 1.2–2.2)
Alkaline Phosphatase: 82 IU/L (ref 39–117)
BUN / CREAT RATIO: 22 (ref 10–24)
BUN: 19 mg/dL (ref 8–27)
Bilirubin Total: 0.5 mg/dL (ref 0.0–1.2)
CALCIUM: 9.7 mg/dL (ref 8.6–10.2)
CO2: 18 mmol/L — AB (ref 20–29)
CREATININE: 0.88 mg/dL (ref 0.76–1.27)
Chloride: 95 mmol/L — ABNORMAL LOW (ref 96–106)
GFR calc non Af Amer: 91 mL/min/{1.73_m2} (ref >59)
GFR, EST AFRICAN AMERICAN: 105 mL/min/{1.73_m2} (ref >59)
GLUCOSE: 95 mg/dL (ref 65–99)
Globulin, Total: 3.3 g/dL (ref 1.5–4.5)
Potassium: 4.7 mmol/L (ref 3.5–5.2)
Sodium: 134 mmol/L (ref 134–144)
TOTAL PROTEIN: 7.9 g/dL (ref 6.0–8.5)

## 2017-11-11 LAB — LIPID PANEL
Chol/HDL Ratio: 2.6 ratio (ref 0.0–5.0)
Cholesterol, Total: 173 mg/dL (ref 100–199)
HDL: 66 mg/dL (ref >39)
LDL CALC: 93 mg/dL (ref 0–99)
Triglycerides: 68 mg/dL (ref 0–149)
VLDL CHOLESTEROL CAL: 14 mg/dL (ref 5–40)

## 2017-11-11 LAB — TSH: TSH: 2.1 u[IU]/mL (ref 0.450–4.500)

## 2017-11-11 LAB — PSA: PROSTATE SPECIFIC AG, SERUM: 0.2 ng/mL (ref 0.0–4.0)

## 2017-11-16 ENCOUNTER — Other Ambulatory Visit: Payer: Self-pay | Admitting: Family Medicine

## 2017-12-23 ENCOUNTER — Encounter: Payer: Self-pay | Admitting: *Deleted

## 2018-01-22 ENCOUNTER — Encounter: Payer: Self-pay | Admitting: Family Medicine

## 2018-01-22 DIAGNOSIS — E1149 Type 2 diabetes mellitus with other diabetic neurological complication: Secondary | ICD-10-CM

## 2018-01-25 MED ORDER — GLUCOSE BLOOD VI STRP: ORAL_STRIP | 12 refills | Status: DC

## 2018-02-09 NOTE — Telephone Encounter (Signed)
Relation to pt: self Call back number: [phone] (H) Pharmacy: [id number] - [place], [address] [phone] (Phone) [phone] (Fax)    Reason for call:  As per pharmacy patient has a contour next meter therefore requesting contour next test strips, please advise

## 2018-02-10 NOTE — Telephone Encounter (Signed)
Faxed

## 2018-02-13 ENCOUNTER — Other Ambulatory Visit: Payer: Self-pay | Admitting: Family Medicine

## 2018-02-15 ENCOUNTER — Ambulatory Visit: Payer: Self-pay | Admitting: *Deleted

## 2018-02-15 NOTE — Telephone Encounter (Signed)
I don't see that he's still on it- can you check?

## 2018-02-15 NOTE — Telephone Encounter (Signed)
He was returning a call regarding his  test strips.  Dr Jeananne Rama  faxed them in and per the pt he already has them.  He thanked me and that was the end of the call.

## 2018-02-15 NOTE — Telephone Encounter (Signed)
Attempted to contact pt regarding refill request left message on voicemail [phone]; unable to discuss pt symptoms; note shows medication d/c'd per Dr Park Liter 11/10/17;Will route to office for final disposition.

## 2018-02-15 NOTE — Telephone Encounter (Signed)
Please advise 

## 2018-02-19 ENCOUNTER — Other Ambulatory Visit: Payer: Self-pay | Admitting: Family Medicine

## 2018-02-19 DIAGNOSIS — G4700 Insomnia, unspecified: Secondary | ICD-10-CM

## 2018-02-19 NOTE — Telephone Encounter (Signed)
Trazodone 50 mg refill request  LOV 11/10/17 with Dr. Jeananne Rama  Has F/U appt on 02/23/18 at 3:30 with Dr. Jeananne Rama.

## 2018-02-23 ENCOUNTER — Ambulatory Visit: Payer: BLUE CROSS/BLUE SHIELD | Admitting: Family Medicine

## 2018-02-24 NOTE — Telephone Encounter (Signed)
Message left for patient to call back about medication

## 2018-03-15 ENCOUNTER — Ambulatory Visit: Payer: BLUE CROSS/BLUE SHIELD | Admitting: Family Medicine

## 2018-03-15 ENCOUNTER — Encounter: Payer: Self-pay | Admitting: Family Medicine

## 2018-03-15 VITALS — BP 106/75 | HR 68 | Ht 71.0 in | Wt 197.0 lb

## 2018-03-15 DIAGNOSIS — E785 Hyperlipidemia, unspecified: Secondary | ICD-10-CM

## 2018-03-15 DIAGNOSIS — I10 Essential (primary) hypertension: Secondary | ICD-10-CM

## 2018-03-15 DIAGNOSIS — E1149 Type 2 diabetes mellitus with other diabetic neurological complication: Secondary | ICD-10-CM

## 2018-03-15 DIAGNOSIS — Z1211 Encounter for screening for malignant neoplasm of colon: Secondary | ICD-10-CM

## 2018-03-15 DIAGNOSIS — Z23 Encounter for immunization: Secondary | ICD-10-CM

## 2018-03-15 LAB — BAYER DCA HB A1C WAIVED: HB A1C (BAYER DCA - WAIVED): 6.9 % (ref <7.0)

## 2018-03-15 NOTE — Assessment & Plan Note (Signed)
The current medical regimen is effective;  continue present plan and medications.  

## 2018-03-15 NOTE — Progress Notes (Signed)
BP 106/75   Pulse 68   Ht 5\' 11"  (1.803 m)   Wt 197 lb (89.4 kg)   SpO2 97%   BMI 27.48 kg/m    Subjective:    Patient ID: Mark Allen, male    DOB: 08-17-1951, 67 y.o.   MRN: [id number]  HPI: Mark Allen is a 67 y.o. male  Chief Complaint  Patient presents with  . Follow-up  . Health Maintenance    Colonoscopy order placed, Eye exam requested from [facility], Prevnar given, Foot exam suplies ready  Reviewed medication discussed with patient biggest concern today is ongoing fatigue.  Reviewed lifestyle patient with limited sleep.  Discussed sleep and getting the TV out of his bedroom. No issues with blood pressure good control taking medications faithfully without problems. Same with cholesterol medication. Diabetes doing well no low blood sugar spells or problems.  Relevant past medical, surgical, family and social history reviewed and updated as indicated. Interim medical history since our last visit reviewed. Allergies and medications reviewed and updated.  Review of Systems  Constitutional: Negative.   Respiratory: Negative.   Cardiovascular: Negative.     Per HPI unless specifically indicated above     Objective:    BP 106/75   Pulse 68   Ht 5\' 11"  (1.803 m)   Wt 197 lb (89.4 kg)   SpO2 97%   BMI 27.48 kg/m   Wt Readings from Last 3 Encounters:  03/15/18 197 lb (89.4 kg)  11/10/17 198 lb (89.8 kg)  06/23/17 194 lb (88 kg)    Physical Exam  Constitutional: He is oriented to person, place, and time. He appears well-developed and well-nourished.  HENT:  Head: Normocephalic and atraumatic.  Eyes: Conjunctivae and EOM are normal.  Neck: Normal range of motion.  Cardiovascular: Normal rate, regular rhythm and normal heart sounds.  Pulmonary/Chest: Effort normal and breath sounds normal.  Musculoskeletal: Normal range of motion.  Neurological: He is alert and oriented to person, place, and time.  Skin: No erythema.  Psychiatric: He has a normal  mood and affect. His behavior is normal. Judgment and thought content normal.    Results for orders placed or performed in visit on 11/10/17  CBC with Differential/Platelet  Result Value Ref Range   WBC 7.2 3.4 - 10.8 x10E3/uL   RBC 5.13 4.14 - 5.80 x10E6/uL   Hemoglobin 16.0 13.0 - 17.7 g/dL   Hematocrit 46.9 37.5 - 51.0 %   MCV 91 79 - 97 fL   MCH 31.2 26.6 - 33.0 pg   MCHC 34.1 31.5 - 35.7 g/dL   RDW 13.1 12.3 - 15.4 %   Platelets 353 150 - 379 x10E3/uL   Neutrophils 59 Not Estab. %   Lymphs 27 Not Estab. %   Monocytes 13 Not Estab. %   Eos 1 Not Estab. %   Basos 0 Not Estab. %   Neutrophils Absolute 4.1 1.4 - 7.0 x10E3/uL   Lymphocytes Absolute 1.9 0.7 - 3.1 x10E3/uL   Monocytes Absolute 1.0 (H) 0.1 - 0.9 x10E3/uL   EOS (ABSOLUTE) 0.1 0.0 - 0.4 x10E3/uL   Basophils Absolute 0.0 0.0 - 0.2 x10E3/uL   Immature Granulocytes 0 Not Estab. %   Immature Grans (Abs) 0.0 0.0 - 0.1 x10E3/uL  Comprehensive metabolic panel  Result Value Ref Range   Glucose 95 65 - 99 mg/dL   BUN 19 8 - 27 mg/dL   Creatinine, Ser 0.88 0.76 - 1.27 mg/dL   GFR calc non Af  Amer 91 >59 mL/min/1.73   GFR calc Af Amer 105 >59 mL/min/1.73   BUN/Creatinine Ratio 22 10 - 24   Sodium 134 134 - 144 mmol/L   Potassium 4.7 3.5 - 5.2 mmol/L   Chloride 95 (L) 96 - 106 mmol/L   CO2 18 (L) 20 - 29 mmol/L   Calcium 9.7 8.6 - 10.2 mg/dL   Total Protein 7.9 6.0 - 8.5 g/dL   Albumin 4.6 3.6 - 4.8 g/dL   Globulin, Total 3.3 1.5 - 4.5 g/dL   Albumin/Globulin Ratio 1.4 1.2 - 2.2   Bilirubin Total 0.5 0.0 - 1.2 mg/dL   Alkaline Phosphatase 82 39 - 117 IU/L   AST 17 0 - 40 IU/L   ALT 19 0 - 44 IU/L  Lipid panel  Result Value Ref Range   Cholesterol, Total 173 100 - 199 mg/dL   Triglycerides 68 0 - 149 mg/dL   HDL 66 >39 mg/dL   VLDL Cholesterol Cal 14 5 - 40 mg/dL   LDL Calculated 93 0 - 99 mg/dL   Chol/HDL Ratio 2.6 0.0 - 5.0 ratio  PSA  Result Value Ref Range   Prostate Specific Ag, Serum 0.2 0.0 - 4.0 ng/mL    TSH  Result Value Ref Range   TSH 2.100 0.450 - 4.500 uIU/mL  Urinalysis, Routine w reflex microscopic  Result Value Ref Range   Specific Gravity, UA 1.020 1.005 - 1.030   pH, UA 5.0 5.0 - 7.5   Color, UA Yellow Yellow   Appearance Ur Clear Clear   Leukocytes, UA Negative Negative   Protein, UA Negative Negative/Trace   Glucose, UA 3+ (A) Negative   Ketones, UA 4+ (A) Negative   RBC, UA Negative Negative   Bilirubin, UA Negative Negative   Urobilinogen, Ur 0.2 0.2 - 1.0 mg/dL   Nitrite, UA Negative Negative  Bayer DCA Hb A1c Waived  Result Value Ref Range   HB A1C (BAYER DCA - WAIVED) 6.8 <7.0 %      Assessment & Plan:   Problem List Items Addressed This Visit      Cardiovascular and Mediastinum   Hypertension - Primary    The current medical regimen is effective;  continue present plan and medications.       Relevant Orders   Bayer DCA Hb A1c Waived     Endocrine   Type II diabetes mellitus with neurological manifestations ([place])    The current medical regimen is effective;  continue present plan and medications.       Relevant Orders   Bayer DCA Hb A1c Waived     Other   Hyperlipidemia    The current medical regimen is effective;  continue present plan and medications.       Relevant Orders   Bayer DCA Hb A1c Waived    Other Visit Diagnoses    Colon cancer screening       Relevant Orders   Ambulatory referral to Gastroenterology   Need for vaccination with 13-polyvalent pneumococcal conjugate vaccine       Relevant Orders   Pneumococcal conjugate vaccine 13-valent (Completed)       Follow up plan: Return in about 3 months (around 06/15/2018) for BMP,  Lipids, ALT, AST, Hemoglobin A1c.

## 2018-04-06 ENCOUNTER — Other Ambulatory Visit: Payer: Self-pay | Admitting: Family Medicine

## 2018-05-26 ENCOUNTER — Other Ambulatory Visit: Payer: Self-pay | Admitting: Family Medicine

## 2018-06-16 ENCOUNTER — Ambulatory Visit (INDEPENDENT_AMBULATORY_CARE_PROVIDER_SITE_OTHER): Payer: BLUE CROSS/BLUE SHIELD | Admitting: Family Medicine

## 2018-06-16 ENCOUNTER — Other Ambulatory Visit: Payer: Self-pay

## 2018-06-16 ENCOUNTER — Encounter: Payer: Self-pay | Admitting: Family Medicine

## 2018-06-16 VITALS — BP 122/75 | HR 60 | Temp 98.2°F | Ht 70.5 in | Wt 203.0 lb

## 2018-06-16 DIAGNOSIS — Z23 Encounter for immunization: Secondary | ICD-10-CM | POA: Diagnosis not present

## 2018-06-16 DIAGNOSIS — E785 Hyperlipidemia, unspecified: Secondary | ICD-10-CM | POA: Diagnosis not present

## 2018-06-16 DIAGNOSIS — I10 Essential (primary) hypertension: Secondary | ICD-10-CM | POA: Diagnosis not present

## 2018-06-16 DIAGNOSIS — E1149 Type 2 diabetes mellitus with other diabetic neurological complication: Secondary | ICD-10-CM

## 2018-06-16 NOTE — Patient Instructions (Signed)
Follow up for CPE 

## 2018-06-16 NOTE — Progress Notes (Signed)
BP 122/75   Pulse 60   Temp 98.2 F (36.8 C) (Oral)   Ht 5' 10.5" (1.791 m)   Wt 203 lb (92.1 kg)   SpO2 96%   BMI 28.72 kg/m    Subjective:    Patient ID: Mark Allen, male    DOB: 1951-05-25, 67 y.o.   MRN: [id number]  HPI: Mark Allen is a 67 y.o. male  Chief Complaint  Patient presents with  . Diabetes  . Hypertension  . Hyperlipidemia   Here today for 6 month f/u chronic illnesses. Taking medications faithfully without side effects. Exercising regularly and trying to eat well.   BPs WNL when checked at home. Denies CP, SOB, dizziness, HAs. Taking lipitor for cholesterol management, no myalgias, claudication.   Occasionally checks sugars at home, getting around 150 in the morning when he wakes up and it comes down during the daytime to 115.   Past Medical History:  Diagnosis Date  . Calculus of kidney   . Hyperlipidemia   . Hypertension   . Impotence, organic   . Insomnia   . Testicular hypofunction   . Type II diabetes mellitus with neurological manifestations [facility])    Social History   Socioeconomic History  . Marital status: Married    Spouse name: Not on file  . Number of children: Not on file  . Years of education: Not on file  . Highest education level: Not on file  Occupational History  . Not on file  Social Needs  . Financial resource strain: Not on file  . Food insecurity:    Worry: Not on file    Inability: Not on file  . Transportation needs:    Medical: Not on file    Non-medical: Not on file  Tobacco Use  . Smoking status: Former Smoker    Types: Cigarettes    Last attempt to quit: 01/27/1989    Years since quitting: 29.4  . Smokeless tobacco: Former Systems developer    Types: [place] date: 09/29/1990  Substance and Sexual Activity  . Alcohol use: Yes    Alcohol/week: 0.0 standard drinks    Comment: rare  . Drug use: No  . Sexual activity: Not on file  Lifestyle  . Physical activity:    Days per week: Not on file    Minutes per  session: Not on file  . Stress: Not on file  Relationships  . Social connections:    Talks on phone: Not on file    Gets together: Not on file    Attends religious service: Not on file    Active member of club or organization: Not on file    Attends meetings of clubs or organizations: Not on file    Relationship status: Not on file  . Intimate partner violence:    Fear of current or ex partner: Not on file    Emotionally abused: Not on file    Physically abused: Not on file    Forced sexual activity: Not on file  Other Topics Concern  . Not on file  Social History Narrative  . Not on file    Relevant past medical, surgical, family and social history reviewed and updated as indicated. Interim medical history since our last visit reviewed. Allergies and medications reviewed and updated.  Review of Systems  Per HPI unless specifically indicated above     Objective:    BP 122/75   Pulse 60   Temp 98.2 F (  36.8 C) (Oral)   Ht 5' 10.5" (1.791 m)   Wt 203 lb (92.1 kg)   SpO2 96%   BMI 28.72 kg/m   Wt Readings from Last 3 Encounters:  06/16/18 203 lb (92.1 kg)  03/15/18 197 lb (89.4 kg)  11/10/17 198 lb (89.8 kg)    Physical Exam  Constitutional: He is oriented to person, place, and time. He appears well-developed and well-nourished. No distress.  HENT:  Head: Atraumatic.  Eyes: Conjunctivae and EOM are normal.  Neck: Normal range of motion. Neck supple.  Cardiovascular: Regular rhythm.  Pulmonary/Chest: Effort normal and breath sounds normal.  Musculoskeletal: Normal range of motion.  Neurological: He is alert and oriented to person, place, and time.  Skin: Skin is warm and dry.  Psychiatric: He has a normal mood and affect. His behavior is normal.  Nursing note and vitals reviewed.   Results for orders placed or performed in visit on 06/16/18  Comprehensive metabolic panel  Result Value Ref Range   Glucose 156 (H) 65 - 99 mg/dL   BUN 20 8 - 27 mg/dL    Creatinine, Ser 0.89 0.76 - 1.27 mg/dL   GFR calc non Af Amer 90 >59 mL/min/1.73   GFR calc Af Amer 104 >59 mL/min/1.73   BUN/Creatinine Ratio 22 10 - 24   Sodium 139 134 - 144 mmol/L   Potassium 4.2 3.5 - 5.2 mmol/L   Chloride 100 96 - 106 mmol/L   CO2 24 20 - 29 mmol/L   Calcium 9.6 8.6 - 10.2 mg/dL   Total Protein 7.0 6.0 - 8.5 g/dL   Albumin 4.5 3.6 - 4.8 g/dL   Globulin, Total 2.5 1.5 - 4.5 g/dL   Albumin/Globulin Ratio 1.8 1.2 - 2.2   Bilirubin Total 0.2 0.0 - 1.2 mg/dL   Alkaline Phosphatase 75 39 - 117 IU/L   AST 14 0 - 40 IU/L   ALT 19 0 - 44 IU/L  Lipid Panel w/o Chol/HDL Ratio  Result Value Ref Range   Cholesterol, Total 161 100 - 199 mg/dL   Triglycerides 158 (H) 0 - 149 mg/dL   HDL 58 >39 mg/dL   VLDL Cholesterol Cal 32 5 - 40 mg/dL   LDL Calculated 71 0 - 99 mg/dL  HgB A1c  Result Value Ref Range   Hgb A1c MFr Bld 7.5 (H) 4.8 - 5.6 %   Est. average glucose Bld gHb Est-mCnc 169 mg/dL      Assessment & Plan:   Problem List Items Addressed This Visit      Cardiovascular and Mediastinum   Hypertension    BPs stable and WNL, continue current regimen      Relevant Orders   Comprehensive metabolic panel (Completed)     Endocrine   Type II diabetes mellitus with neurological manifestations ([facility]) - Primary    Check A1C, adjust as needed. Continue good diet and exercise habits      Relevant Orders   HgB A1c (Completed)     Other   Hyperlipidemia    Recheck lipids, continue current regimen      Relevant Orders   Lipid Panel w/o Chol/HDL Ratio (Completed)    Other Visit Diagnoses    Flu vaccine need       Relevant Orders   Flu vaccine HIGH DOSE PF (Completed)       Follow up plan: Return in about 3 months (around 09/15/2018) for CPE.

## 2018-06-17 ENCOUNTER — Other Ambulatory Visit: Payer: Self-pay | Admitting: Family Medicine

## 2018-06-17 LAB — HEMOGLOBIN A1C
ESTIMATED AVERAGE GLUCOSE: 169 mg/dL
Hgb A1c MFr Bld: 7.5 % — ABNORMAL HIGH (ref 4.8–5.6)

## 2018-06-17 LAB — COMPREHENSIVE METABOLIC PANEL
ALBUMIN: 4.5 g/dL (ref 3.6–4.8)
ALT: 19 IU/L (ref 0–44)
AST: 14 IU/L (ref 0–40)
Albumin/Globulin Ratio: 1.8 (ref 1.2–2.2)
Alkaline Phosphatase: 75 IU/L (ref 39–117)
BILIRUBIN TOTAL: 0.2 mg/dL (ref 0.0–1.2)
BUN/Creatinine Ratio: 22 (ref 10–24)
BUN: 20 mg/dL (ref 8–27)
CO2: 24 mmol/L (ref 20–29)
Calcium: 9.6 mg/dL (ref 8.6–10.2)
Chloride: 100 mmol/L (ref 96–106)
Creatinine, Ser: 0.89 mg/dL (ref 0.76–1.27)
GFR calc non Af Amer: 90 mL/min/{1.73_m2} (ref >59)
GFR, EST AFRICAN AMERICAN: 104 mL/min/{1.73_m2} (ref >59)
GLUCOSE: 156 mg/dL — AB (ref 65–99)
Globulin, Total: 2.5 g/dL (ref 1.5–4.5)
Potassium: 4.2 mmol/L (ref 3.5–5.2)
Sodium: 139 mmol/L (ref 134–144)
Total Protein: 7 g/dL (ref 6.0–8.5)

## 2018-06-17 LAB — HM DIABETES EYE EXAM

## 2018-06-17 LAB — LIPID PANEL W/O CHOL/HDL RATIO
Cholesterol, Total: 161 mg/dL (ref 100–199)
HDL: 58 mg/dL (ref >39)
LDL Calculated: 71 mg/dL (ref 0–99)
Triglycerides: 158 mg/dL — ABNORMAL HIGH (ref 0–149)
VLDL CHOLESTEROL CAL: 32 mg/dL (ref 5–40)

## 2018-06-17 MED ORDER — SITAGLIPTIN PHOS-METFORMIN HCL 50-1000 MG PO TABS: 1.0000 | ORAL_TABLET | Freq: Two times a day (BID) | ORAL | 2 refills | Status: DC

## 2018-06-20 NOTE — Assessment & Plan Note (Signed)
Recheck lipids, continue current regimen 

## 2018-06-20 NOTE — Assessment & Plan Note (Signed)
Check A1C, adjust as needed. Continue good diet and exercise habits

## 2018-06-20 NOTE — Assessment & Plan Note (Signed)
BPs stable and WNL, continue current regimen 

## 2018-06-29 ENCOUNTER — Encounter: Payer: Self-pay | Admitting: Family Medicine

## 2018-06-29 DIAGNOSIS — S8290XA Unspecified fracture of unspecified lower leg, initial encounter for closed fracture: Secondary | ICD-10-CM

## 2018-06-29 HISTORY — DX: Unspecified fracture of unspecified lower leg, initial encounter for closed fracture: S82.90XA

## 2018-07-08 ENCOUNTER — Encounter: Payer: Self-pay | Admitting: Family Medicine

## 2018-07-19 ENCOUNTER — Emergency Department
Admission: EM | Admit: 2018-07-19 | Discharge: 2018-07-19 | Disposition: A | Discharge status: Home / Self Care | Payer: BLUE CROSS/BLUE SHIELD | Attending: Emergency Medicine | Admitting: Emergency Medicine

## 2018-07-19 ENCOUNTER — Emergency Department: Payer: BLUE CROSS/BLUE SHIELD

## 2018-07-19 ENCOUNTER — Other Ambulatory Visit: Payer: Self-pay

## 2018-07-19 DIAGNOSIS — Y9389 Activity, other specified: Secondary | ICD-10-CM | POA: Insufficient documentation

## 2018-07-19 DIAGNOSIS — Y929 Unspecified place or not applicable: Secondary | ICD-10-CM | POA: Insufficient documentation

## 2018-07-19 DIAGNOSIS — Z79899 Other long term (current) drug therapy: Secondary | ICD-10-CM | POA: Insufficient documentation

## 2018-07-19 DIAGNOSIS — R52 Pain, unspecified: Secondary | ICD-10-CM

## 2018-07-19 DIAGNOSIS — Z87891 Personal history of nicotine dependence: Secondary | ICD-10-CM | POA: Diagnosis not present

## 2018-07-19 DIAGNOSIS — S99912A Unspecified injury of left ankle, initial encounter: Secondary | ICD-10-CM | POA: Diagnosis present

## 2018-07-19 DIAGNOSIS — Y998 Other external cause status: Secondary | ICD-10-CM | POA: Diagnosis not present

## 2018-07-19 DIAGNOSIS — Z7984 Long term (current) use of oral hypoglycemic drugs: Secondary | ICD-10-CM | POA: Diagnosis not present

## 2018-07-19 DIAGNOSIS — W1781XA Fall down embankment (hill), initial encounter: Secondary | ICD-10-CM | POA: Diagnosis not present

## 2018-07-19 DIAGNOSIS — E119 Type 2 diabetes mellitus without complications: Secondary | ICD-10-CM | POA: Diagnosis not present

## 2018-07-19 DIAGNOSIS — S82432A Displaced oblique fracture of shaft of left fibula, initial encounter for closed fracture: Secondary | ICD-10-CM | POA: Insufficient documentation

## 2018-07-19 DIAGNOSIS — S82402A Unspecified fracture of shaft of left fibula, initial encounter for closed fracture: Secondary | ICD-10-CM

## 2018-07-19 DIAGNOSIS — I10 Essential (primary) hypertension: Secondary | ICD-10-CM | POA: Diagnosis not present

## 2018-07-19 NOTE — ED Triage Notes (Signed)
Pt states he was playing golf today and slipped on a hill and fell with his left foot folded under him and felt something pop.

## 2018-07-19 NOTE — Discharge Instructions (Signed)
Wear splint and ambulate with crutches until evaluation by orthopedics.

## 2018-07-19 NOTE — ED Notes (Signed)
Patient refused crutches. Reports he has some at home

## 2018-07-19 NOTE — ED Provider Notes (Signed)
[facility] Emergency Department Provider Note   ____________________________________________   First MD Initiated Contact with Patient 07/19/18 1623     (approximate)  I have reviewed the triage vital signs and the nursing notes.   HISTORY  Chief Complaint Foot Injury    HPI Mark Allen is a 67 y.o. male patient complain of left ankle pain secondary to slipping on a hill.  Patient said he felt a "pop" left ankle.  Patient rates the pain as a 1/10.  Patient state he is able to ambulate with mild discomfort.  Patient refused pain medication at this time.  Past Medical History:  Diagnosis Date  . Calculus of kidney   . Hyperlipidemia   . Hypertension   . Impotence, organic   . Insomnia   . Testicular hypofunction   . Type II diabetes mellitus with neurological manifestations [facility])     Patient Active Problem List   Diagnosis Date Noted  . Insomnia 05/29/2015  . Type II diabetes mellitus with neurological manifestations ([place])   . Hypertension   . Hyperlipidemia   . History of nonmelanoma skin cancer 03/03/2013    Past Surgical History:  Procedure Laterality Date  . NASAL SINUS SURGERY  2004  . REFRACTIVE SURGERY Bilateral   . SPLENECTOMY  1963  . TONSILLECTOMY      Prior to Admission medications   Medication Sig Start Date End Date Taking? Authorizing Provider  amLODipine (NORVASC) 5 MG tablet TAKE ONE (1) TABLET EACH DAY 11/10/17   [name], MD  benazepril (LOTENSIN) 40 MG tablet Take 1 tablet (40 mg total) by mouth daily. 11/10/17   [name], MD  Dulaglutide (TRULICITY) 1.5 DH/7.4BU SOPN Inject 1.5 mg into the skin once a week. 11/10/17   [name], MD  DULoxetine (CYMBALTA) 60 MG capsule TAKE 1 CAPSULE BY MOUTH EVERY DAY. START AFTER 30MG  CAPSULES WERE USED. 05/26/18   [name], MD  empagliflozin (JARDIANCE) 25 MG TABS tablet Take 25 mg by mouth daily. 11/10/17   [name], MD  glucose blood (CONTOUR  TEST) test strip Use as instructed to test blood sugar levels BID 01/25/18   [name], MD  rosuvastatin (CRESTOR) 40 MG tablet Take 1 tablet (40 mg total) by mouth daily. 11/10/17   [name], MD  sildenafil (VIAGRA) 100 MG tablet TAKE 1 TABLET DAILY AS NEEDED FOR ERECTILE DYSFUNCTION 04/07/18   [name], MD  sitaGLIPtin-metformin (JANUMET) 50-1000 MG tablet Take 1 tablet by mouth 2 (two) times daily with a meal. 06/17/18   [name], PA-C  traZODone (DESYREL) 50 MG tablet TAKE 1 TABLET BY MOUTH EVERYDAY AT BEDTIME 02/19/18   Johnson, Megan P, DO  zolpidem (AMBIEN) 10 MG tablet Take 1 tablet (10 mg total) by mouth at bedtime as needed for sleep. Patient not taking: Reported on 06/16/2018 09/01/16   [name], MD    Allergies Aspirin; Sulfa antibiotics; and Sulfacetamide sodium  Family History  Problem Relation Age of Onset  . Cancer Father     Social History Social History   Tobacco Use  . Smoking status: Former Smoker    Types: Cigarettes    Last attempt to quit: 01/27/1989    Years since quitting: 29.4  . Smokeless tobacco: Former Systems developer    Types: Chew    Quit date: 09/29/1990  Substance Use Topics  . Alcohol use: Yes    Alcohol/week: 0.0 standard drinks  Comment: rare  . Drug use: No    Review of Systems Constitutional: No fever/chills Eyes: No visual changes. ENT: No sore throat. Cardiovascular: Denies chest pain. Respiratory: Denies shortness of breath. Gastrointestinal: No abdominal pain.  No nausea, no vomiting.  No diarrhea.  No constipation. Genitourinary: Negative for dysuria. Musculoskeletal: Left ankle pain. Skin: Negative for rash. Neurological: Negative for headaches, focal weakness or numbness. Endocrine:Diabetes, hyperlipidemia, and hypertension. Hematological/Lymphatic: Allergic/Immunilogical: Aspirin and sulfa antibiotics.  ____________________________________________   PHYSICAL EXAM:  VITAL SIGNS: ED Triage  Vitals  Enc Vitals Group     BP 07/19/18 1514 124/79     Pulse Rate 07/19/18 1514 65     Resp 07/19/18 1514 17     Temp 07/19/18 1514 98.1 F (36.7 C)     Temp Source 07/19/18 1514 Oral     SpO2 07/19/18 1514 97 %     Weight 07/19/18 1408 199 lb (90.3 kg)     Height 07/19/18 1408 5\' 10"  (1.778 m)     Head Circumference --      Peak Flow --      Pain Score 07/19/18 1407 1     Pain Loc --      Pain Edu? --      Excl. in [place]? --    Constitutional: Alert and oriented. Well appearing and in no acute distress. Neck: No cervical spine tenderness to palpation. Cardiovascular: Normal rate, regular rhythm. Grossly normal heart sounds.  Good peripheral circulation. Respiratory: Normal respiratory effort.  No retractions. Lungs CTAB. Musculoskeletal: No obvious deformity to the left ankle.  Mild guarding with palpation of the lateral distal malleolus. Neurologic:  Normal speech and language. No gross focal neurologic deficits are appreciated. No gait instability. Skin:  Skin is warm, dry and intact. No rash noted. Psychiatric: Mood and affect are normal. Speech and behavior are normal.  ____________________________________________   LABS (all labs ordered are listed, but only abnormal results are displayed)  Labs Reviewed - No data to display ____________________________________________  EKG   ____________________________________________  RADIOLOGY  ED MD interpretation:    Official radiology report(s): Dg Ankle Complete Left  Result Date: 07/19/2018 CLINICAL DATA:  Trip and fall while playing golf with ankle pain, initial encounter EXAM: LEFT ANKLE COMPLETE - 3+ VIEW COMPARISON:  None. FINDINGS: Oblique fracture is noted through the distal fibular shaft. Only minimal soft tissue changes are noted. No definitive tibial abnormality is seen. No other focal abnormality is seen. IMPRESSION: Distal fibular fracture. Electronically Signed   By: Inez Catalina M.D.   On: 07/19/2018 14:58    Dg Foot Complete Left  Result Date: 07/19/2018 CLINICAL DATA:  Trip and fall today while playing golf with ankle pain, initial encounter EXAM: LEFT FOOT - COMPLETE 3+ VIEW COMPARISON:  None. FINDINGS: There is a minimally displaced oblique fracture through the distal fibular shaft. No fractures in the foot are identified. No soft tissue changes are seen. IMPRESSION: Oblique fracture through the distal fibula. Electronically Signed   By: Inez Catalina M.D.   On: 07/19/2018 14:57    ____________________________________________   PROCEDURES  Procedure(s) performed: None  Procedures  Critical Care performed: No  ____________________________________________   INITIAL IMPRESSION / ASSESSMENT AND PLAN / ED COURSE  As part of my medical decision making, I reviewed the following data within the electronic MEDICAL RECORD NUMBER    Right foot and ankle pain secondary to a distal fibular fracture.  Discussed x-ray findings with patient.  Patient placed in a splint and  given crutches for ambulation.  Patient advised to call orthopedic in the morning to schedule appointment for definitive evaluation and treatment.      ____________________________________________   FINAL CLINICAL IMPRESSION(S) / ED DIAGNOSES  Final diagnoses:  Closed fracture of shaft of left fibula, unspecified fracture morphology, initial encounter     ED Discharge Orders    None       Note:  This document was prepared using Dragon voice recognition software and may include unintentional dictation errors.    Sable Feil, PA-C 07/19/18 1629    Carrie Mew, MD 07/21/18 [phone]

## 2018-07-27 ENCOUNTER — Other Ambulatory Visit: Payer: Self-pay | Admitting: Family Medicine

## 2018-07-28 ENCOUNTER — Other Ambulatory Visit: Payer: Self-pay | Admitting: Family Medicine

## 2018-08-23 ENCOUNTER — Other Ambulatory Visit: Payer: Self-pay | Admitting: Family Medicine

## 2018-08-23 DIAGNOSIS — G4700 Insomnia, unspecified: Secondary | ICD-10-CM

## 2018-08-24 NOTE — Telephone Encounter (Signed)
Requested Prescriptions  Pending Prescriptions Disp Refills  . traZODone (DESYREL) 50 MG tablet [Pharmacy Med Name: TRAZODONE 50 MG TABLET] 90 tablet 0    Sig: TAKE 1 TABLET BY MOUTH EVERYDAY AT BEDTIME     Psychiatry: Antidepressants - Serotonin Modulator Passed - 08/23/2018  2:16 AM      Passed - Valid encounter within last 6 months    Recent Outpatient Visits          2 months ago Type II diabetes mellitus with neurological manifestations [facility])   [facility] [name], [place]   5 months ago Essential hypertension   [place] [facility], Jeannette How, MD   9 months ago Essential hypertension   [place] Cr[facility], Jeannette How, MD   1 year ago Type II diabetes mellitus with neurological manifestations [facility])   [facility] Crissman, Jeannette How, MD   1 year ago Essential hypertension   [place], Jeannette How, MD      Future Appointments            In 3 weeks Orene Desanctis, Lilia Argue, [place], [place]

## 2018-09-09 ENCOUNTER — Other Ambulatory Visit: Payer: Self-pay | Admitting: Family Medicine

## 2018-09-15 ENCOUNTER — Encounter: Payer: Self-pay | Admitting: Family Medicine

## 2018-09-15 ENCOUNTER — Ambulatory Visit (INDEPENDENT_AMBULATORY_CARE_PROVIDER_SITE_OTHER): Payer: BLUE CROSS/BLUE SHIELD | Admitting: Family Medicine

## 2018-09-15 VITALS — BP 125/80 | HR 84 | Temp 98.4°F | Ht 69.0 in | Wt 200.0 lb

## 2018-09-15 DIAGNOSIS — Z0000 Encounter for general adult medical examination without abnormal findings: Secondary | ICD-10-CM | POA: Diagnosis not present

## 2018-09-15 DIAGNOSIS — Z1211 Encounter for screening for malignant neoplasm of colon: Secondary | ICD-10-CM

## 2018-09-15 DIAGNOSIS — E785 Hyperlipidemia, unspecified: Secondary | ICD-10-CM

## 2018-09-15 DIAGNOSIS — I10 Essential (primary) hypertension: Secondary | ICD-10-CM | POA: Diagnosis not present

## 2018-09-15 DIAGNOSIS — E1149 Type 2 diabetes mellitus with other diabetic neurological complication: Secondary | ICD-10-CM

## 2018-09-15 DIAGNOSIS — N521 Erectile dysfunction due to diseases classified elsewhere: Secondary | ICD-10-CM

## 2018-09-15 DIAGNOSIS — G4700 Insomnia, unspecified: Secondary | ICD-10-CM | POA: Diagnosis not present

## 2018-09-15 LAB — UA/M W/RFLX CULTURE, ROUTINE
BILIRUBIN UA: NEGATIVE
Leukocytes, UA: NEGATIVE
Nitrite, UA: NEGATIVE
Protein, UA: NEGATIVE
RBC UA: NEGATIVE
Specific Gravity, UA: 1.015 (ref 1.005–1.030)
UUROB: 1 mg/dL (ref 0.2–1.0)
pH, UA: 5.5 (ref 5.0–7.5)

## 2018-09-15 MED ORDER — TRAZODONE HCL 50 MG PO TABS: ORAL_TABLET | ORAL | 1 refills | Status: DC

## 2018-09-15 MED ORDER — SILDENAFIL CITRATE 100 MG PO TABS: ORAL_TABLET | ORAL | 6 refills | Status: DC

## 2018-09-15 MED ORDER — DULOXETINE HCL 60 MG PO CPEP: ORAL_CAPSULE | ORAL | 3 refills | Status: DC

## 2018-09-15 NOTE — Progress Notes (Signed)
BP 125/80   Pulse 84   Temp 98.4 F (36.9 C) (Oral)   Ht 5\' 9"  (1.753 m)   Wt 200 lb (90.7 kg)   SpO2 98%   BMI 29.53 kg/m    Subjective:    Patient ID: Mark Allen, male    DOB: November 12, 1950, 67 y.o.   MRN: [id number]  HPI: Mark Allen is a 67 y.o. male presenting on 09/15/2018 for comprehensive medical examination. Current medical complaints include:see below  Taking his medications faithfully without side effects. No concerns today.   Bps have been in the 120s/80s when checked at home. Has been trying hard with diet but unable to exercise recently due to recovering from LE fracture. Denies CP, SOB, HAs, dizziness.   Home BSs variable, 120s-150s on average. Januvia was added in the form of janumet after last visit as A1C was not quite to goal. Taking cymbalta for neuropathy which he does feel helps.   Using viagra prn without issue.  Trazodone working very well for sleep. Has ambien around for as needed but rarely ever uses it. Has not had a refill in almost 3 years.   He currently lives with: Interim Problems from his last visit: no  Depression Screen done today and results listed below:  Depression screen [facility] 2/9 09/15/2018 03/15/2018 11/10/2017 03/23/2017  Decreased Interest 0 0 0 0  Down, Depressed, Hopeless 0 0 0 0  PHQ - 2 Score 0 0 0 0  Altered sleeping 0 - - -  Tired, decreased energy 3 - - -  Change in appetite 0 - - -  Feeling bad or failure about yourself  0 - - -  Trouble concentrating 0 - - -  Moving slowly or fidgety/restless 0 - - -  Suicidal thoughts 0 - - -  PHQ-9 Score 3 - - -    The patient does not have a history of falls. I did not complete a risk assessment for falls. A plan of care for falls was not documented.   Past Medical History:  Past Medical History:  Diagnosis Date  . Broken leg 06/2018   Left leg  . Calculus of kidney   . Hyperlipidemia   . Hypertension   . Impotence, organic   . Insomnia   . Testicular hypofunction   .  Type II diabetes mellitus with neurological manifestations [facility])     Surgical History:  Past Surgical History:  Procedure Laterality Date  . NASAL SINUS SURGERY  2004  . REFRACTIVE SURGERY Bilateral   . SPLENECTOMY  1963  . TONSILLECTOMY      Medications:  Current Outpatient Medications on File Prior to Visit  Medication Sig  . amLODipine (NORVASC) 5 MG tablet TAKE ONE (1) TABLET EACH DAY  . benazepril (LOTENSIN) 40 MG tablet Take 1 tablet (40 mg total) by mouth daily.  . Dulaglutide (TRULICITY) 1.5 UK/0.2RK SOPN Inject 1.5 mg into the skin once a week.  . empagliflozin (JARDIANCE) 25 MG TABS tablet Take 25 mg by mouth daily.  Marland Kitchen glucose blood (CONTOUR TEST) test strip Use as instructed to test blood sugar levels BID  . JANUMET 50-1000 MG tablet TAKE 1 TABLET BY MOUTH 2 (TWO) TIMES DAILY WITH A MEAL.  . rosuvastatin (CRESTOR) 40 MG tablet Take 1 tablet (40 mg total) by mouth daily.  Marland Kitchen zolpidem (AMBIEN) 10 MG tablet Take 1 tablet (10 mg total) by mouth at bedtime as needed for sleep.   No current facility-administered medications on file  prior to visit.     Allergies:  Allergies  Allergen Reactions  . Aspirin Other (See Comments)    bleeding  . Sulfa Antibiotics   . Sulfacetamide Sodium     Social History:  Social History   Socioeconomic History  . Marital status: Married    Spouse name: Not on file  . Number of children: Not on file  . Years of education: Not on file  . Highest education level: Not on file  Occupational History  . Not on file  Social Needs  . Financial resource strain: Not on file  . Food insecurity:    Worry: Not on file    Inability: Not on file  . Transportation needs:    Medical: Not on file    Non-medical: Not on file  Tobacco Use  . Smoking status: Former Smoker    Types: Cigarettes    Last attempt to quit: 01/27/1989    Years since quitting: 29.6  . Smokeless tobacco: Former Systems developer    Types: [place] date: 09/29/1990  Substance and  Sexual Activity  . Alcohol use: Yes    Alcohol/week: 0.0 standard drinks    Comment: rare  . Drug use: No  . Sexual activity: Not on file  Lifestyle  . Physical activity:    Days per week: Not on file    Minutes per session: Not on file  . Stress: Not on file  Relationships  . Social connections:    Talks on phone: Not on file    Gets together: Not on file    Attends religious service: Not on file    Active member of club or organization: Not on file    Attends meetings of clubs or organizations: Not on file    Relationship status: Not on file  . Intimate partner violence:    Fear of current or ex partner: Not on file    Emotionally abused: Not on file    Physically abused: Not on file    Forced sexual activity: Not on file  Other Topics Concern  . Not on file  Social History Narrative  . Not on file   Social History   Tobacco Use  Smoking Status Former Smoker  . Types: Cigarettes  . Last attempt to quit: 01/27/1989  . Years since quitting: 29.6  Smokeless Tobacco Former Systems developer  . Types: Chew  . Quit date: 09/29/1990   Social History   Substance and Sexual Activity  Alcohol Use Yes  . Alcohol/week: 0.0 standard drinks   Comment: rare    Family History:  Family History  Problem Relation Age of Onset  . Cancer Father     Past medical history, surgical history, medications, allergies, family history and social history reviewed with patient today and changes made to appropriate areas of the chart.   Review of Systems - General ROS: negative Psychological ROS: negative Ophthalmic ROS: negative ENT ROS: negative Allergy and Immunology ROS: negative Hematological and Lymphatic ROS: negative Endocrine ROS: negative Respiratory ROS: no cough, shortness of breath, or wheezing Cardiovascular ROS: no chest pain or dyspnea on exertion Gastrointestinal ROS: no abdominal pain, change in bowel habits, or black or bloody stools Genito-Urinary ROS: no dysuria, trouble  voiding, or hematuria Musculoskeletal ROS: negative Neurological ROS: no TIA or stroke symptoms Dermatological ROS: negative All other ROS negative except what is listed above and in the HPI.      Objective:    BP 125/80   Pulse 84  Temp 98.4 F (36.9 C) (Oral)   Ht 5\' 9"  (1.753 m)   Wt 200 lb (90.7 kg)   SpO2 98%   BMI 29.53 kg/m   Wt Readings from Last 3 Encounters:  09/15/18 200 lb (90.7 kg)  07/19/18 199 lb (90.3 kg)  06/16/18 203 lb (92.1 kg)    Physical Exam Vitals signs and nursing note reviewed.  Constitutional:      General: He is not in acute distress.    Appearance: He is well-developed.  HENT:     Head: Atraumatic.     Right Ear: External ear normal.     Left Ear: External ear normal.     Nose: Nose normal.  Eyes:     General: No scleral icterus.    Conjunctiva/sclera: Conjunctivae normal.     Pupils: Pupils are equal, round, and reactive to light.  Neck:     Musculoskeletal: Normal range of motion and neck supple.  Cardiovascular:     Rate and Rhythm: Normal rate and regular rhythm.     Heart sounds: Normal heart sounds. No murmur.  Pulmonary:     Effort: Pulmonary effort is normal. No respiratory distress.     Breath sounds: Normal breath sounds.  Abdominal:     General: Bowel sounds are normal. There is no distension.     Palpations: Abdomen is soft. There is no mass.     Tenderness: There is no abdominal tenderness. There is no guarding.  Genitourinary:    Prostate: Normal.  Musculoskeletal: Normal range of motion.        General: No tenderness.  Skin:    General: Skin is warm and dry.     Findings: No rash.  Neurological:     Mental Status: He is alert and oriented to person, place, and time.     Deep Tendon Reflexes: Reflexes are normal and symmetric.  Psychiatric:        Behavior: Behavior normal.     Results for orders placed or performed in visit on 09/15/18  CBC with Differential/Platelet  Result Value Ref Range   WBC 7.7 3.4 -  10.8 x10E3/uL   RBC 4.81 4.14 - 5.80 x10E6/uL   Hemoglobin 15.5 13.0 - 17.7 g/dL   Hematocrit 44.7 37.5 - 51.0 %   MCV 93 79 - 97 fL   MCH 32.2 26.6 - 33.0 pg   MCHC 34.7 31.5 - 35.7 g/dL   RDW 12.3 12.3 - 15.4 %   Platelets 355 150 - 450 x10E3/uL   Neutrophils 56 Not Estab. %   Lymphs 29 Not Estab. %   Monocytes 12 Not Estab. %   Eos 2 Not Estab. %   Basos 1 Not Estab. %   Neutrophils Absolute 4.3 1.4 - 7.0 x10E3/uL   Lymphocytes Absolute 2.3 0.7 - 3.1 x10E3/uL   Monocytes Absolute 0.9 0.1 - 0.9 x10E3/uL   EOS (ABSOLUTE) 0.2 0.0 - 0.4 x10E3/uL   Basophils Absolute 0.1 0.0 - 0.2 x10E3/uL   Immature Granulocytes 0 Not Estab. %   Immature Grans (Abs) 0.0 0.0 - 0.1 x10E3/uL  Comprehensive metabolic panel  Result Value Ref Range   Glucose 145 (H) 65 - 99 mg/dL   BUN 20 8 - 27 mg/dL   Creatinine, Ser 1.21 0.76 - 1.27 mg/dL   GFR calc non Af Amer 62 >59 mL/min/1.73   GFR calc Af Amer 72 >59 mL/min/1.73   BUN/Creatinine Ratio 17 10 - 24   Sodium 140 134 - 144 mmol/L  Potassium 4.4 3.5 - 5.2 mmol/L   Chloride 100 96 - 106 mmol/L   CO2 24 20 - 29 mmol/L   Calcium 9.8 8.6 - 10.2 mg/dL   Total Protein 7.2 6.0 - 8.5 g/dL   Albumin 4.6 3.6 - 4.8 g/dL   Globulin, Total 2.6 1.5 - 4.5 g/dL   Albumin/Globulin Ratio 1.8 1.2 - 2.2   Bilirubin Total 0.2 0.0 - 1.2 mg/dL   Alkaline Phosphatase 77 39 - 117 IU/L   AST 14 0 - 40 IU/L   ALT 21 0 - 44 IU/L  Lipid Panel w/o Chol/HDL Ratio  Result Value Ref Range   Cholesterol, Total 145 100 - 199 mg/dL   Triglycerides 147 0 - 149 mg/dL   HDL 53 >39 mg/dL   VLDL Cholesterol Cal 29 5 - 40 mg/dL   LDL Calculated 63 0 - 99 mg/dL  UA/M w/rflx Culture, Routine  Result Value Ref Range   Specific Gravity, UA 1.015 1.005 - 1.030   pH, UA 5.5 5.0 - 7.5   Color, UA Yellow Yellow   Appearance Ur Clear Clear   Leukocytes, UA Negative Negative   Protein, UA Negative Negative/Trace   Glucose, UA 3+ (A) Negative   Ketones, UA 1+ (A) Negative   RBC,  UA Negative Negative   Bilirubin, UA Negative Negative   Urobilinogen, Ur 1.0 0.2 - 1.0 mg/dL   Nitrite, UA Negative Negative  HgB A1c  Result Value Ref Range   Hgb A1c MFr Bld 6.7 (H) 4.8 - 5.6 %   Est. average glucose Bld gHb Est-mCnc 146 mg/dL      Assessment & Plan:   Problem List Items Addressed This Visit      Cardiovascular and Mediastinum   Hypertension - Primary    BPs stable and WNL, continue current regimen      Relevant Medications   sildenafil (VIAGRA) 100 MG tablet   Other Relevant Orders   CBC with Differential/Platelet (Completed)   Comprehensive metabolic panel (Completed)   UA/M w/rflx Culture, Routine (Completed)     Endocrine   Type II diabetes mellitus with neurological manifestations (HCC)    Recheck A1C, will adjust as needed. Continue working on lifestyle modifications and continue cymbalta for neuropathy      Relevant Orders   HgB A1c (Completed)     Other   Hyperlipidemia    Stable on crestor, recheck lipids and adjust as needed. Lifestyle modifications reviewed      Relevant Medications   sildenafil (VIAGRA) 100 MG tablet   Other Relevant Orders   Lipid Panel w/o Chol/HDL Ratio (Completed)   Insomnia    Stable on trazodone, continue current regimen      Relevant Medications   traZODone (DESYREL) 50 MG tablet   ED (erectile dysfunction)    Stable on prn viagra. Continue current regimen       Other Visit Diagnoses    Annual physical exam       Screening for colon cancer       Relevant Orders   Ambulatory referral to Gastroenterology       Discussed aspirin prophylaxis for myocardial infarction prevention  LABORATORY TESTING:  Health maintenance labs ordered today as discussed above.   The natural history of prostate cancer and ongoing controversy regarding screening and potential treatment outcomes of prostate cancer has been discussed with the patient. The meaning of a false positive PSA and a false negative PSA has been  discussed. He indicates understanding of the limitations  of this screening test and wishes not to proceed with screening PSA testing.   IMMUNIZATIONS:   - Tdap: Tetanus vaccination status reviewed: last tetanus booster within 10 years. - Influenza: Up to date - Pneumovax: Up to date - Prevnar: Up to date - HPV: Not applicable - Zostavax vaccine: Up to date  SCREENING: - Colonoscopy: Ordered today  Discussed with patient purpose of the colonoscopy is to detect colon cancer at curable precancerous or early stages   PATIENT COUNSELING:    Sexuality: Discussed sexually transmitted diseases, partner selection, use of condoms, avoidance of unintended pregnancy  and contraceptive alternatives.   Advised to avoid cigarette smoking.  I discussed with the patient that most people either abstain from alcohol or drink within safe limits (<=14/week and <=4 drinks/occasion for males, <=7/weeks and <= 3 drinks/occasion for females) and that the risk for alcohol disorders and other health effects rises proportionally with the number of drinks per week and how often a drinker exceeds daily limits.  Discussed cessation/primary prevention of drug use and availability of treatment for abuse.   Diet: Encouraged to adjust caloric intake to maintain  or achieve ideal body weight, to reduce intake of dietary saturated fat and total fat, to limit sodium intake by avoiding high sodium foods and not adding table salt, and to maintain adequate dietary potassium and calcium preferably from fresh fruits, vegetables, and low-fat dairy products.    stressed the importance of regular exercise  Injury prevention: Discussed safety belts, safety helmets, smoke detector, smoking near bedding or upholstery.   Dental health: Discussed importance of regular tooth brushing, flossing, and dental visits.   Follow up plan: NEXT PREVENTATIVE PHYSICAL DUE IN 1 YEAR. Return in about 3 months (around 12/15/2018) for A1C.

## 2018-09-16 LAB — CBC WITH DIFFERENTIAL/PLATELET
Basophils Absolute: 0.1 10*3/uL (ref 0.0–0.2)
Basos: 1 %
EOS (ABSOLUTE): 0.2 10*3/uL (ref 0.0–0.4)
EOS: 2 %
HEMATOCRIT: 44.7 % (ref 37.5–51.0)
HEMOGLOBIN: 15.5 g/dL (ref 13.0–17.7)
IMMATURE GRANULOCYTES: 0 %
Immature Grans (Abs): 0 10*3/uL (ref 0.0–0.1)
LYMPHS ABS: 2.3 10*3/uL (ref 0.7–3.1)
Lymphs: 29 %
MCH: 32.2 pg (ref 26.6–33.0)
MCHC: 34.7 g/dL (ref 31.5–35.7)
MCV: 93 fL (ref 79–97)
Monocytes Absolute: 0.9 10*3/uL (ref 0.1–0.9)
Monocytes: 12 %
NEUTROS PCT: 56 %
Neutrophils Absolute: 4.3 10*3/uL (ref 1.4–7.0)
Platelets: 355 10*3/uL (ref 150–450)
RBC: 4.81 x10E6/uL (ref 4.14–5.80)
RDW: 12.3 % (ref 12.3–15.4)
WBC: 7.7 10*3/uL (ref 3.4–10.8)

## 2018-09-16 LAB — COMPREHENSIVE METABOLIC PANEL
ALBUMIN: 4.6 g/dL (ref 3.6–4.8)
ALT: 21 IU/L (ref 0–44)
AST: 14 IU/L (ref 0–40)
Albumin/Globulin Ratio: 1.8 (ref 1.2–2.2)
Alkaline Phosphatase: 77 IU/L (ref 39–117)
BUN / CREAT RATIO: 17 (ref 10–24)
BUN: 20 mg/dL (ref 8–27)
Bilirubin Total: 0.2 mg/dL (ref 0.0–1.2)
CALCIUM: 9.8 mg/dL (ref 8.6–10.2)
CO2: 24 mmol/L (ref 20–29)
CREATININE: 1.21 mg/dL (ref 0.76–1.27)
Chloride: 100 mmol/L (ref 96–106)
GFR calc Af Amer: 72 mL/min/{1.73_m2} (ref >59)
GFR, EST NON AFRICAN AMERICAN: 62 mL/min/{1.73_m2} (ref >59)
GLOBULIN, TOTAL: 2.6 g/dL (ref 1.5–4.5)
Glucose: 145 mg/dL — ABNORMAL HIGH (ref 65–99)
Potassium: 4.4 mmol/L (ref 3.5–5.2)
SODIUM: 140 mmol/L (ref 134–144)
Total Protein: 7.2 g/dL (ref 6.0–8.5)

## 2018-09-16 LAB — HEMOGLOBIN A1C
Est. average glucose Bld gHb Est-mCnc: 146 mg/dL
Hgb A1c MFr Bld: 6.7 % — ABNORMAL HIGH (ref 4.8–5.6)

## 2018-09-16 LAB — LIPID PANEL W/O CHOL/HDL RATIO
Cholesterol, Total: 145 mg/dL (ref 100–199)
HDL: 53 mg/dL (ref >39)
LDL CALC: 63 mg/dL (ref 0–99)
TRIGLYCERIDES: 147 mg/dL (ref 0–149)
VLDL Cholesterol Cal: 29 mg/dL (ref 5–40)

## 2018-09-19 DIAGNOSIS — N529 Male erectile dysfunction, unspecified: Secondary | ICD-10-CM | POA: Insufficient documentation

## 2018-09-19 NOTE — Assessment & Plan Note (Signed)
Stable on trazodone, continue current regimen

## 2018-09-19 NOTE — Assessment & Plan Note (Signed)
BPs stable and WNL, continue current regimen 

## 2018-09-19 NOTE — Assessment & Plan Note (Signed)
Stable on crestor, recheck lipids and adjust as needed. Lifestyle modifications reviewed

## 2018-09-19 NOTE — Assessment & Plan Note (Signed)
Stable on prn viagra. Continue current regimen

## 2018-09-19 NOTE — Assessment & Plan Note (Signed)
Recheck A1C, will adjust as needed. Continue working on lifestyle modifications and continue cymbalta for neuropathy

## 2018-09-22 ENCOUNTER — Other Ambulatory Visit: Payer: Self-pay | Admitting: Family Medicine

## 2018-09-23 ENCOUNTER — Other Ambulatory Visit: Payer: Self-pay

## 2018-09-23 DIAGNOSIS — Z1211 Encounter for screening for malignant neoplasm of colon: Secondary | ICD-10-CM

## 2018-09-23 NOTE — Telephone Encounter (Signed)
Requested Prescriptions  Pending Prescriptions Disp Refills  . JANUMET 50-1000 MG tablet [Pharmacy Med Name: JANUMET 50-1,000 MG TABLET] 90 tablet 0    Sig: TAKE 1 TABLET BY MOUTH 2 (TWO) TIMES DAILY WITH A MEAL.     Endocrinology:  Diabetes - Biguanide + DPP-4 Inhibitor Combos Passed - 09/22/2018 11:33 AM      Passed - HBA1C is between 0 and 7.9 and within 180 days    Hemoglobin A1C  Date Value Ref Range Status  05/22/2016 7.6  Final   HB A1C (BAYER DCA - WAIVED)  Date Value Ref Range Status  03/15/2018 6.9 <7.0 % Final    Comment:                                          Diabetic Adult            <7.0                                       Healthy Adult        4.3 - 5.7                                                           (DCCT/NGSP) American Diabetes Association's Summary of Glycemic Recommendations for Adults with Diabetes: Hemoglobin A1c <7.0%. More stringent glycemic goals (A1c <6.0%) may further reduce complications at the cost of increased risk of hypoglycemia.    Hgb A1c MFr Bld  Date Value Ref Range Status  09/15/2018 6.7 (H) 4.8 - 5.6 % Final    Comment:             Prediabetes: 5.7 - 6.4          Diabetes: >6.4          Glycemic control for adults with diabetes: <7.0          Passed - Cr in normal range and within 360 days    Creatinine, Ser  Date Value Ref Range Status  09/15/2018 1.21 0.76 - 1.27 mg/dL Final         Passed - eGFR in normal range and within 360 days    GFR calc Af Amer  Date Value Ref Range Status  09/15/2018 72 >59 mL/min/1.73 Final   GFR calc non Af Amer  Date Value Ref Range Status  09/15/2018 62 >59 mL/min/1.73 Final         Passed - Valid encounter within last 6 months    Recent Outpatient Visits          1 week ago Essential hypertension   [name], [place]   3 months ago Type II diabetes mellitus with neurological manifestations [facility])   [name],  [place]   6 months ago Essential hypertension   [facility], Jeannette How, [place]   10 months ago Essential hypertension   [facility], Jeannette How, MD   1 year ago Type II diabetes mellitus with neurological manifestations [facility])   [facility], Jeannette How, MD  Future Appointments            In 2 months Lane, Rachel Elizabeth, PA-C [facility]           

## 2018-10-21 ENCOUNTER — Encounter: Payer: Self-pay | Admitting: *Deleted

## 2018-10-22 ENCOUNTER — Ambulatory Visit: Payer: BLUE CROSS/BLUE SHIELD | Admitting: Certified Registered"

## 2018-10-22 ENCOUNTER — Ambulatory Visit
Admission: RE | Admit: 2018-10-22 | Discharge: 2018-10-22 | Disposition: A | Discharge status: Home / Self Care | Payer: BLUE CROSS/BLUE SHIELD | Attending: Gastroenterology | Admitting: Gastroenterology

## 2018-10-22 ENCOUNTER — Encounter
Admission: RE | Disposition: A | Discharge status: Home / Self Care | Payer: Self-pay | Source: Home / Self Care | Attending: Gastroenterology

## 2018-10-22 ENCOUNTER — Other Ambulatory Visit: Payer: Self-pay | Admitting: Family Medicine

## 2018-10-22 DIAGNOSIS — Z9081 Acquired absence of spleen: Secondary | ICD-10-CM | POA: Diagnosis not present

## 2018-10-22 DIAGNOSIS — K640 First degree hemorrhoids: Secondary | ICD-10-CM | POA: Insufficient documentation

## 2018-10-22 DIAGNOSIS — I10 Essential (primary) hypertension: Secondary | ICD-10-CM | POA: Insufficient documentation

## 2018-10-22 DIAGNOSIS — E119 Type 2 diabetes mellitus without complications: Secondary | ICD-10-CM | POA: Insufficient documentation

## 2018-10-22 DIAGNOSIS — Z886 Allergy status to analgesic agent status: Secondary | ICD-10-CM | POA: Diagnosis not present

## 2018-10-22 DIAGNOSIS — Z1211 Encounter for screening for malignant neoplasm of colon: Secondary | ICD-10-CM | POA: Diagnosis not present

## 2018-10-22 DIAGNOSIS — Z87442 Personal history of urinary calculi: Secondary | ICD-10-CM | POA: Diagnosis not present

## 2018-10-22 DIAGNOSIS — K5730 Diverticulosis of large intestine without perforation or abscess without bleeding: Secondary | ICD-10-CM | POA: Insufficient documentation

## 2018-10-22 DIAGNOSIS — Z87891 Personal history of nicotine dependence: Secondary | ICD-10-CM | POA: Insufficient documentation

## 2018-10-22 DIAGNOSIS — D124 Benign neoplasm of descending colon: Secondary | ICD-10-CM | POA: Diagnosis not present

## 2018-10-22 DIAGNOSIS — E785 Hyperlipidemia, unspecified: Secondary | ICD-10-CM | POA: Diagnosis not present

## 2018-10-22 DIAGNOSIS — E291 Testicular hypofunction: Secondary | ICD-10-CM | POA: Diagnosis not present

## 2018-10-22 DIAGNOSIS — G4700 Insomnia, unspecified: Secondary | ICD-10-CM | POA: Diagnosis not present

## 2018-10-22 DIAGNOSIS — Z882 Allergy status to sulfonamides status: Secondary | ICD-10-CM | POA: Insufficient documentation

## 2018-10-22 DIAGNOSIS — Z79899 Other long term (current) drug therapy: Secondary | ICD-10-CM | POA: Insufficient documentation

## 2018-10-22 DIAGNOSIS — Z809 Family history of malignant neoplasm, unspecified: Secondary | ICD-10-CM | POA: Diagnosis not present

## 2018-10-22 HISTORY — PX: COLONOSCOPY WITH PROPOFOL: SHX5780

## 2018-10-22 LAB — GLUCOSE, CAPILLARY: Glucose-Capillary: 132 mg/dL — ABNORMAL HIGH (ref 70–99)

## 2018-10-22 SURGERY — COLONOSCOPY WITH PROPOFOL
Anesthesia: General

## 2018-10-22 MED ORDER — PROPOFOL 10 MG/ML IV BOLUS
INTRAVENOUS | Status: DC | PRN
  Administered 2018-10-22 (×4): 20 mg via INTRAVENOUS
  Administered 2018-10-22: 50 mg via INTRAVENOUS
  Administered 2018-10-22: 20 mg via INTRAVENOUS
  Administered 2018-10-22: 50 mg via INTRAVENOUS
  Administered 2018-10-22: 20 mg via INTRAVENOUS
  Administered 2018-10-22: 50 mg via INTRAVENOUS
  Administered 2018-10-22 (×2): 20 mg via INTRAVENOUS

## 2018-10-22 MED ORDER — SODIUM CHLORIDE 0.9 % IV SOLN
INTRAVENOUS | Status: DC
  Administered 2018-10-22: 10:00:00 via INTRAVENOUS

## 2018-10-22 NOTE — Anesthesia Postprocedure Evaluation (Signed)
Anesthesia Post Note  Patient: Mark Allen  Procedure(s) Performed: COLONOSCOPY WITH PROPOFOL (N/A )  Patient location during evaluation: Endoscopy Anesthesia Type: General Level of consciousness: awake and alert and oriented Pain management: pain level controlled Vital Signs Assessment: post-procedure vital signs reviewed and stable Respiratory status: spontaneous breathing, nonlabored ventilation and respiratory function stable Cardiovascular status: blood pressure returned to baseline and stable Postop Assessment: no signs of nausea or vomiting Anesthetic complications: no     Last Vitals:  Vitals:   10/22/18 1135 10/22/18 1136  BP: 103/76   Pulse:  89  Resp:  12  Temp: (!) 36.1 C (!) 36.1 C  SpO2:  99%    Last Pain:  Vitals:   10/22/18 1136  TempSrc:   PainSc: 0-No pain                 Chaseton Yepiz

## 2018-10-22 NOTE — Transfer of Care (Signed)
Immediate Anesthesia Transfer of Care Note  Patient: Mark Allen  Procedure(s) Performed: COLONOSCOPY WITH PROPOFOL (N/A )  Patient Location: PACU and Endoscopy Unit  Anesthesia Type:General  Level of Consciousness: awake and drowsy  Airway & Oxygen Therapy: Patient Spontanous Breathing and Patient connected to nasal cannula oxygen  Post-op Assessment: Report given to RN and Post -op Vital signs reviewed and stable  Post vital signs: Reviewed and stable  Last Vitals:  Vitals Value Taken Time  BP 103/76 10/22/2018 11:35 AM  Temp    Pulse 90 10/22/2018 11:36 AM  Resp 14 10/22/2018 11:36 AM  SpO2 97 % 10/22/2018 11:36 AM  Vitals shown include unvalidated device data.  Last Pain:  Vitals:   10/22/18 1135  TempSrc: (P) Tympanic  PainSc:          Complications: No apparent anesthesia complications

## 2018-10-22 NOTE — Anesthesia Post-op Follow-up Note (Signed)
Anesthesia QCDR form completed.        

## 2018-10-22 NOTE — Anesthesia Preprocedure Evaluation (Signed)
Anesthesia Evaluation  Patient identified by MRN, date of birth, ID band Patient awake    Reviewed: Allergy & Precautions, NPO status , Patient's Chart, lab work & pertinent test results  History of Anesthesia Complications Negative for: history of anesthetic complications  Airway Mallampati: II  TM Distance: <3 FB Neck ROM: Full    Dental no notable dental hx.    Pulmonary neg sleep apnea, neg COPD, former smoker,    breath sounds clear to auscultation- rhonchi (-) wheezing      Cardiovascular hypertension, Pt. on medications (-) CAD, (-) Past MI, (-) Cardiac Stents and (-) CABG  Rhythm:Regular Rate:Normal - Systolic murmurs and - Diastolic murmurs    Neuro/Psych neg Seizures negative neurological ROS  negative psych ROS   GI/Hepatic negative GI ROS, Neg liver ROS,   Endo/Other  diabetes, Oral Hypoglycemic Agents  Renal/GU Renal disease: hx of nephrolithiasis.     Musculoskeletal negative musculoskeletal ROS (+)   Abdominal (+) - obese,   Peds  Hematology negative hematology ROS (+)   Anesthesia Other Findings Past Medical History: 06/2018: Broken leg     Comment:  Left leg No date: Calculus of kidney No date: Hyperlipidemia No date: Hypertension No date: Impotence, organic No date: Insomnia No date: Testicular hypofunction No date: Type II diabetes mellitus with neurological manifestations  (HCC)   Reproductive/Obstetrics                             Anesthesia Physical Anesthesia Plan  ASA: II  Anesthesia Plan: General   Post-op Pain Management:    Induction: Intravenous  PONV Risk Score and Plan: 1 and Propofol infusion  Airway Management Planned: Natural Airway  Additional Equipment:   Intra-op Plan:   Post-operative Plan:   Informed Consent: I have reviewed the patients History and Physical, chart, labs and discussed the procedure including the risks, benefits  and alternatives for the proposed anesthesia with the patient or authorized representative who has indicated his/her understanding and acceptance.     Dental advisory given  Plan Discussed with: CRNA and Anesthesiologist  Anesthesia Plan Comments:         Anesthesia Quick Evaluation

## 2018-10-22 NOTE — H&P (Signed)
Mark Bellows, MD [address], [place], [place], [place], [address] [address], [place], [place], [place], [address] Phone: [phone]  Fax: [phone]  Primary Care Physician:  [name], MD   Pre-Procedure History & Physical: HPI:  Mark Allen is a 68 y.o. male is here for an colonoscopy.   Past Medical History:  Diagnosis Date  . Broken leg 06/2018   Left leg  . Calculus of kidney   . Hyperlipidemia   . Hypertension   . Impotence, organic   . Insomnia   . Testicular hypofunction   . Type II diabetes mellitus with neurological manifestations [facility])     Past Surgical History:  Procedure Laterality Date  . NASAL SINUS SURGERY  2004  . REFRACTIVE SURGERY Bilateral   . SPLENECTOMY  1963  . TONSILLECTOMY      Prior to Admission medications   Medication Sig Start Date End Date Taking? Authorizing Provider  amLODipine (NORVASC) 5 MG tablet TAKE ONE (1) TABLET EACH DAY 11/10/17  Yes Crissman, Jeannette How, MD  benazepril (LOTENSIN) 40 MG tablet Take 1 tablet (40 mg total) by mouth daily. 11/10/17  Yes Crissman, Jeannette How, MD  DULoxetine (CYMBALTA) 60 MG capsule TAKE 1 CAPSULE BY MOUTH EVERY DAY. START AFTER 30MG  CAPSULES WERE USED. 09/15/18  Yes [name], PA-C  empagliflozin (JARDIANCE) 25 MG TABS tablet Take 25 mg by mouth daily. 11/10/17  Yes [name], MD  glucose blood (CONTOUR TEST) test strip Use as instructed to test blood sugar levels BID 01/25/18  Yes Crissman, Jeannette How, MD  JANUMET 50-1000 MG tablet TAKE 1 TABLET BY MOUTH 2 (TWO) TIMES DAILY WITH A MEAL. 09/23/18  Yes Crissman, Jeannette How, MD  rosuvastatin (CRESTOR) 40 MG tablet Take 1 tablet (40 mg total) by mouth daily. 11/10/17  Yes Crissman, Jeannette How, MD  sildenafil (VIAGRA) 100 MG tablet TAKE 1 TABLET DAILY AS NEEDED FOR ERECTILE DYSFUNCTION 09/15/18  Yes [name], PA-C  traZODone (DESYREL) 50 MG tablet TAKE 1 TABLET BY MOUTH EVERYDAY AT BEDTIME 09/15/18  Yes [name], PA-C  zolpidem (AMBIEN) 10 MG tablet Take 1 tablet (10 mg total) by mouth at bedtime as needed for sleep. 09/01/16  Yes Crissman, Jeannette How, MD  Dulaglutide (TRULICITY) 1.5 PF/7.9KW SOPN Inject 1.5 mg into the skin once a week. 11/10/17   [name], MD    Allergies as of 09/24/2018 - Review Complete 09/15/2018  Allergen Reaction Noted  . Aspirin Other (See Comments) 05/25/2015  . Sulfa antibiotics  03/03/2013  . Sulfacetamide sodium  07/12/2015    Family History  Problem Relation Age of Onset  . Cancer Father     Social History   Socioeconomic History  . Marital status: Married    Spouse name: Not on file  . Number of children: Not on file  . Years of education: Not on file  . Highest education level: Not on file  Occupational History  . Not on file  Social Needs  . Financial resource strain: Not on file  . Food insecurity:    Worry: Not on file    Inability: Not on file  . Transportation needs:    Medical: Not on file    Non-medical: Not on file  Tobacco Use  . Smoking status: Former Smoker    Types: Cigarettes    Last attempt to quit: 01/27/1989    Years since quitting: 29.7  . Smokeless tobacco: Former Systems developer  Types: Sarina Ser    Quit date: 09/29/1990  Substance and Sexual Activity  . Alcohol use: Yes    Alcohol/week: 0.0 standard drinks    Comment: rare,none last 24hrs  . Drug use: No  . Sexual activity: Not on file  Lifestyle  . Physical activity:    Days per week: Not on file    Minutes per session: Not on file  . Stress: Not on file  Relationships  . Social connections:    Talks on phone: Not on file    Gets together: Not on file    Attends religious service: Not on file    Active member of club or organization: Not on file    Attends meetings of clubs or organizations: Not on file    Relationship status: Not on file  . Intimate partner violence:    Fear of current or ex partner: Not on file    Emotionally abused: Not on file    Physically  abused: Not on file    Forced sexual activity: Not on file  Other Topics Concern  . Not on file  Social History Narrative  . Not on file    Review of Systems: See HPI, otherwise negative ROS  Physical Exam: BP 136/90   Pulse 80   Temp (!) 96.3 F (35.7 C) (Tympanic)   Resp 20   Ht 5\' 9"  (1.753 m)   Wt 86.2 kg   SpO2 97%   BMI 28.06 kg/m  General:   Alert,  pleasant and cooperative in NAD Head:  Normocephalic and atraumatic. Neck:  Supple; no masses or thyromegaly. Lungs:  Clear throughout to auscultation, normal respiratory effort.    Heart:  +S1, +S2, Regular rate and rhythm, No edema. Abdomen:  Soft, nontender and nondistended. Normal bowel sounds, without guarding, and without rebound.   Neurologic:  Alert and  oriented x4;  grossly normal neurologically.  Impression/Plan: [name] is here for an colonoscopy to be performed for Screening colonoscopy average risk   Risks, benefits, limitations, and alternatives regarding  colonoscopy have been reviewed with the patient.  Questions have been answered.  All parties agreeable.   Mark Bellows, MD  10/22/2018, 10:59 AM

## 2018-10-22 NOTE — Op Note (Signed)
[facility] Gastroenterology Patient Name: Mark Allen Procedure Date: 10/22/2018 11:12 AM MRN: [id number] Account #: [id number] Date of Birth: 1950/11/13 Admit Type: Outpatient Age: 68 Room: [facility] ENDO ROOM 4 Gender: Male Note Status: Finalized Procedure:            Colonoscopy Indications:          Screening for colorectal malignant neoplasm Providers:            Jonathon Bellows MD, MD Referring MD:         [name], MD (Referring MD) Medicines:            Monitored Anesthesia Care Complications:        No immediate complications. Procedure:            Pre-Anesthesia Assessment:                       - Prior to the procedure, a History and Physical was                        performed, and patient medications, allergies and                        sensitivities were reviewed. The patient's tolerance of                        previous anesthesia was reviewed.                       - The risks and benefits of the procedure and the                        sedation options and risks were discussed with the                        patient. All questions were answered and informed                        consent was obtained.                       - ASA Grade Assessment: II - A patient with mild                        systemic disease.                       After obtaining informed consent, the colonoscope was                        passed under direct vision. Throughout the procedure,                        the patient's blood pressure, pulse, and oxygen                        saturations were monitored continuously. The                        Colonoscope was introduced through the anus and  advanced to the the cecum, identified by the                        appendiceal orifice, IC valve and transillumination.                        The colonoscopy was performed with ease. The patient                        tolerated the procedure well. The  quality of the bowel                        preparation was good. Findings:      The perianal and digital rectal examinations were normal.      A 3 mm polyp was found in the descending colon. The polyp was sessile.       The polyp was removed with a cold biopsy forceps. Resection and       retrieval were complete.      Multiple small-mouthed diverticula were found in the sigmoid colon.      Non-bleeding internal hemorrhoids were found during retroflexion. The       hemorrhoids were medium-sized and Grade I (internal hemorrhoids that do       not prolapse).      The exam was otherwise without abnormality on direct and retroflexion       views. Impression:           - One 3 mm polyp in the descending colon, removed with                        a cold biopsy forceps. Resected and retrieved.                       - Diverticulosis in the sigmoid colon.                       - Non-bleeding internal hemorrhoids.                       - The examination was otherwise normal on direct and                        retroflexion views. Recommendation:       - Discharge patient to home (with escort).                       - Resume previous diet.                       - Continue present medications.                       - Await pathology results.                       - Repeat colonoscopy in 5-10 years for surveillance                        based on pathology results. Procedure Code(s):    --- Professional ---  45380, Colonoscopy, flexible; with biopsy, single or                        multiple Diagnosis Code(s):    --- Professional ---                       Z12.11, Encounter for screening for malignant neoplasm                        of colon                       D12.4, Benign neoplasm of descending colon                       K64.0, First degree hemorrhoids                       K57.30, Diverticulosis of large intestine without                        perforation or abscess  without bleeding CPT copyright 2018 American [employer]. All rights reserved. The codes documented in this report are preliminary and upon coder review may  be revised to meet current compliance requirements. Jonathon Bellows, MD Jonathon Bellows MD, MD 10/22/2018 11:33:43 AM This report has been signed electronically. Number of Addenda: 0 Note Initiated On: 10/22/2018 11:12 AM Scope Withdrawal Time: 0 hours 12 minutes 39 seconds  Total Procedure Duration: 0 hours 14 minutes 0 seconds       [facility]

## 2018-10-25 ENCOUNTER — Encounter: Payer: Self-pay | Admitting: Gastroenterology

## 2018-10-26 ENCOUNTER — Encounter: Payer: Self-pay | Admitting: Gastroenterology

## 2018-10-26 LAB — SURGICAL PATHOLOGY

## 2018-11-14 ENCOUNTER — Other Ambulatory Visit: Payer: Self-pay | Admitting: Family Medicine

## 2018-11-14 DIAGNOSIS — E1140 Type 2 diabetes mellitus with diabetic neuropathy, unspecified: Secondary | ICD-10-CM

## 2018-11-14 DIAGNOSIS — E785 Hyperlipidemia, unspecified: Secondary | ICD-10-CM

## 2018-11-14 DIAGNOSIS — E1149 Type 2 diabetes mellitus with other diabetic neurological complication: Secondary | ICD-10-CM

## 2018-11-14 DIAGNOSIS — I10 Essential (primary) hypertension: Secondary | ICD-10-CM

## 2018-11-15 NOTE — Telephone Encounter (Signed)
Requested medication (s) are due for refill today: yes  Requested medication (s) are on the active medication list: yes   Last refill:  11/10/2017 for 90 tabs with 4 refills for all medications  Future visit scheduled: yes  Notes to clinic:  All 4 prescriptions expired on 11/10/2018.  Requested Prescriptions  Pending Prescriptions Disp Refills   rosuvastatin (CRESTOR) 40 MG tablet [Pharmacy Med Name: ROSUVASTATIN CALCIUM 40 MG TAB] 90 tablet 4    Sig: TAKE 1 TABLET BY MOUTH EVERY DAY     Cardiovascular:  Antilipid - Statins Passed - 11/14/2018  1:20 AM      Passed - Total Cholesterol in normal range and within 360 days    Cholesterol, Total  Date Value Ref Range Status  09/15/2018 145 100 - 199 mg/dL Final   Cholesterol Piccolo, Waived  Date Value Ref Range Status  03/23/2017 145 <200 mg/dL Final    Comment:                            Desirable                <200                         Borderline High      200- 239                         High                     >239          Passed - LDL in normal range and within 360 days    LDL Calculated  Date Value Ref Range Status  09/15/2018 63 0 - 99 mg/dL Final         Passed - HDL in normal range and within 360 days    HDL  Date Value Ref Range Status  09/15/2018 53 >39 mg/dL Final         Passed - Triglycerides in normal range and within 360 days    Triglycerides  Date Value Ref Range Status  09/15/2018 147 0 - 149 mg/dL Final   Triglycerides Piccolo,Waived  Date Value Ref Range Status  03/23/2017 74 <150 mg/dL Final    Comment:                            Normal                   <150                         Borderline High     150 - 199                         High                200 - 499                         Very High                >499          Passed - Patient is not pregnant      Passed - Valid encounter within  last 12 months    Recent Outpatient Visits          2 months ago Essential  hypertension   [facility] Merrie Roof [place], [place]   5 months ago Type II diabetes mellitus with neurological manifestations [facility])   [name], [place]   8 months ago Essential hypertension   [place] [facility], Jeannette How, [place]   1 year ago Essential hypertension   [place], Jeannette How, MD   1 year ago Type II diabetes mellitus with neurological manifestations [facility])   [place] [facility], Jeannette How, MD      Future Appointments            In 1 month [facility] Desanctis, Lilia Argue, PA-C [place], [facility]          amLODipine (NORVASC) 5 MG tablet [Pharmacy Med Name: AMLODIPINE BESYLATE 5 MG TAB] 90 tablet 4    Sig: TAKE ONE (1) TABLET EACH DAY     Cardiovascular:  Calcium Channel Blockers Passed - 11/14/2018  1:20 AM      Passed - Last BP in normal range    BP Readings from Last 1 Encounters:  10/22/18 103/76         Passed - Valid encounter within last 6 months    Recent Outpatient Visits          2 months ago Essential hypertension   [facility] Merrie Roof [place], [place]   5 months ago Type II diabetes mellitus with neurological manifestations [facility])   [place], [place], [place]   8 months ago Essential hypertension   [place] [facility], Jeannette How, MD   1 year ago Essential hypertension   [place], Jeannette How, MD   1 year ago Type II diabetes mellitus with neurological manifestations [facility])   [place] Crissman, Jeannette How, MD      Future Appointments            In 1 month Orene Desanctis, Lilia Argue, [place], [facility]          benazepril (LOTENSIN) 40 MG tablet [Pharmacy Med Name: BENAZEPRIL HCL 40 MG TABLET] 90 tablet 4    Sig: TAKE 1 TABLET BY MOUTH EVERY DAY     Cardiovascular:  ACE Inhibitors Passed - 11/14/2018  1:20 AM      Passed - Cr in normal range and within  180 days    Creatinine, Ser  Date Value Ref Range Status  09/15/2018 1.21 0.76 - 1.27 mg/dL Final         Passed - K in normal range and within 180 days    Potassium  Date Value Ref Range Status  09/15/2018 4.4 3.5 - 5.2 mmol/L Final         Passed - Patient is not pregnant      Passed - Last BP in normal range    BP Readings from Last 1 Encounters:  10/22/18 103/76         Passed - Valid encounter within last 6 months    Recent Outpatient Visits          2 months ago Essential hypertension   [name], [place]   5 months ago Type II diabetes mellitus with neurological manifestations [facility])   [facility] [name], [place]   8 months ago Essential hypertension   [place]  [name], MD   1 year ago Essential hypertension   [place], Jeannette How, MD   1 year ago Type II diabetes mellitus with neurological manifestations [facility])   [place] Crissman, Jeannette How, MD      Future Appointments            In 1 month Orene Desanctis, Lilia Argue, [place], [facility]          JARDIANCE 25 MG TABS tablet [employer] Med Name: JARDIANCE 25 MG TABLET] 90 tablet 4    Sig: TAKE 1 [place]     Endocrinology:  Diabetes - SGLT2 Inhibitors Passed - 11/14/2018  1:20 AM      Passed - Cr in normal range and within 360 days    Creatinine, Ser  Date Value Ref Range Status  09/15/2018 1.21 0.76 - 1.27 mg/dL Final         Passed - LDL in normal range and within 360 days    LDL Calculated  Date Value Ref Range Status  09/15/2018 63 0 - 99 mg/dL Final         Passed - HBA1C is between 0 and 7.9 and within 180 days    Hemoglobin A1C  Date Value Ref Range Status  05/22/2016 7.6  Final   HB A1C (BAYER DCA - WAIVED)  Date Value Ref Range Status  03/15/2018 6.9 <7.0 % Final    Comment:                                          Diabetic Adult             <7.0                                       Healthy Adult        4.3 - 5.7                                                           (DCCT/NGSP) American Diabetes Association's Summary of Glycemic Recommendations for Adults with Diabetes: Hemoglobin A1c <7.0%. More stringent glycemic goals (A1c <6.0%) may further reduce complications at the cost of increased risk of hypoglycemia.    Hgb A1c MFr Bld  Date Value Ref Range Status  09/15/2018 6.7 (H) 4.8 - 5.6 % Final    Comment:             Prediabetes: 5.7 - 6.4          Diabetes: >6.4          Glycemic control for adults with diabetes: <7.0          Passed - eGFR in normal range and within 360 days    GFR calc Af Amer  Date Value Ref Range Status  09/15/2018 72 >59 mL/min/1.73 Final   GFR calc non Af Amer  Date Value Ref Range Status  09/15/2018 62 >59 mL/min/1.73 Final         Passed - Valid encounter within last 6 months    Recent Outpatient Visits  2 months ago Essential hypertension   [place], [place], [place]   5 months ago Type II diabetes mellitus with neurological manifestations [facility])   [place], [place], [place]   8 months ago Essential hypertension   [place] Crissman, Jeannette How, MD   1 year ago Essential hypertension   [place], Jeannette How, MD   1 year ago Type II diabetes mellitus with neurological manifestations [facility]   [place], [place]      Future Appointments            In 1 month Orene Desanctis, Lilia Argue, [place], [facility]         Refused Prescriptions Disp Refills   metFORMIN (GLUCOPHAGE) 500 MG tablet [Pharmacy Med Name: METFORMIN HCL 500 MG TABLET] 180 tablet 4    Sig: TAKE 2 TABLETS (1,000 MG TOTAL) BY MOUTH AT BEDTIME.     Endocrinology:  Diabetes - Biguanides Passed - 11/14/2018  1:20 AM      Passed - Cr in normal range and within 360 days     Creatinine, Ser  Date Value Ref Range Status  09/15/2018 1.21 0.76 - 1.27 mg/dL Final         Passed - HBA1C is between 0 and 7.9 and within 180 days    Hemoglobin A1C  Date Value Ref Range Status  05/22/2016 7.6  Final   HB A1C (BAYER DCA - WAIVED)  Date Value Ref Range Status  03/15/2018 6.9 <7.0 % Final    Comment:                                          Diabetic Adult            <7.0                                       Healthy Adult        4.3 - 5.7                                                           (DCCT/NGSP) American Diabetes Association's Summary of Glycemic Recommendations for Adults with Diabetes: Hemoglobin A1c <7.0%. More stringent glycemic goals (A1c <6.0%) may further reduce complications at the cost of increased risk of hypoglycemia.    Hgb A1c MFr Bld  Date Value Ref Range Status  09/15/2018 6.7 (H) 4.8 - 5.6 % Final    Comment:             Prediabetes: 5.7 - 6.4          Diabetes: >6.4          Glycemic control for adults with diabetes: <7.0          Passed - eGFR in normal range and within 360 days    GFR calc Af Amer  Date Value Ref Range Status  09/15/2018 72 >59 mL/min/1.73 Final   GFR calc non Af Amer  Date Value Ref Range Status  09/15/2018 62 >59 mL/min/1.73 Final  Passed - Valid encounter within last 6 months    Recent Outpatient Visits          2 months ago Essential hypertension   [facility] Merrie Roof [place], [place]   5 months ago Type II diabetes mellitus with neurological manifestations [facility])   [facility] Merrie Roof [place], [place]   8 months ago Essential hypertension   [place] Crissman, Jeannette How, MD   1 year ago Essential hypertension   [place], Jeannette How, MD   1 year ago Type II diabetes mellitus with neurological manifestations [facility])   [place], MD      Future Appointments            In 1 month Orene Desanctis,  Lilia Argue, [place], [facility]

## 2018-11-22 ENCOUNTER — Telehealth: Payer: Self-pay

## 2018-11-22 NOTE — Telephone Encounter (Signed)
I think the jardiance has been on board for 2 years now, the change was just adding the [place] component to the metformin and d/c'ing the plain metformincfp

## 2018-11-22 NOTE — Telephone Encounter (Signed)
Faxed pharmacy back about patient being on Janumet and not plain metformin and plain metformin was d/c.

## 2018-11-22 NOTE — Telephone Encounter (Signed)
Received a medication refill on Metformin 500mg  tabs. This medication was d/c and patient was put on Jardiance. But what I've read-patient could not afford Jardiance so it was switched to Janumet because patient found a couple.   However, Janumet was sent in 10/22/2018 #180. Then Jardiance was sent in 11/25/2018 #90.  What exactly is patient supposed to be taking for diabetes? Is patient supposed to be taking both Jardiance and Janumet or just one or the other.   Routing to [place] because she last saw patient and has an upcoming appointment with Apolonio Schneiders.

## 2018-12-08 ENCOUNTER — Other Ambulatory Visit: Payer: Self-pay

## 2018-12-08 MED ORDER — DULAGLUTIDE 1.5 MG/0.5ML ~~LOC~~ SOAJ: 1.5000 mg | SUBCUTANEOUS | 4 refills | Status: DC

## 2018-12-08 NOTE — Telephone Encounter (Signed)
Patient last seen 09/15/18 and has f/up 12/15/18.

## 2018-12-15 ENCOUNTER — Ambulatory Visit: Payer: Self-pay | Admitting: Family Medicine

## 2018-12-29 ENCOUNTER — Telehealth: Payer: Self-pay | Admitting: Family Medicine

## 2018-12-29 ENCOUNTER — Other Ambulatory Visit: Payer: Self-pay | Admitting: Family Medicine

## 2018-12-29 MED ORDER — SILDENAFIL CITRATE 100 MG PO TABS: ORAL_TABLET | ORAL | 12 refills | Status: DC

## 2018-12-29 NOTE — Addendum Note (Signed)
Addended by: Golden Pop A on: 12/29/2018 12:23 PM   Modules accepted: Orders

## 2018-12-29 NOTE — Telephone Encounter (Signed)
Called pt. Re: need to sched. 3 mo. F/u to recheck Diabetes and have Hgb. A1C.  While discussing the above, pt. Requested a refill on his Sildenafil.  Stated he sent a message on MyChart, but did not receive this.  Forwarding to the office.

## 2018-12-29 NOTE — Telephone Encounter (Signed)
Requested medication (s) are due for refill today:  yes  Requested medication (s) are on the active medication list:  yes  Future visit scheduled:  no  Last Refill: 10/22/18 #180; no refills  Call placed to pt.  Stated he was on vacation and cancelled his last appt. Via the [employer].  Stated he wasn't sure about rescheduling with the Coronavirus scare.    Advised will send request to office to get recommendations on rescheduling 3 mo. F/u and recheck of Hgb A1C.  Agrees with plan.   Requested Prescriptions  Pending Prescriptions Disp Refills   JANUMET 50-1000 MG tablet [Pharmacy Med Name: JANUMET 50-1,000 MG TABLET] 180 tablet 0    Sig: TAKE 1 TABLET BY MOUTH 2 (TWO) TIMES DAILY WITH A MEAL.     Endocrinology:  Diabetes - Biguanide + DPP-4 Inhibitor Combos Passed - 12/29/2018 10:35 AM      Passed - HBA1C is between 0 and 7.9 and within 180 days    Hemoglobin A1C  Date Value Ref Range Status  05/22/2016 7.6  Final   HB A1C (BAYER DCA - WAIVED)  Date Value Ref Range Status  03/15/2018 6.9 <7.0 % Final    Comment:                                          Diabetic Adult            <7.0                                       Healthy Adult        4.3 - 5.7                                                           (DCCT/NGSP) American Diabetes Association's Summary of Glycemic Recommendations for Adults with Diabetes: Hemoglobin A1c <7.0%. More stringent glycemic goals (A1c <6.0%) may further reduce complications at the cost of increased risk of hypoglycemia.    Hgb A1c MFr Bld  Date Value Ref Range Status  09/15/2018 6.7 (H) 4.8 - 5.6 % Final    Comment:             Prediabetes: 5.7 - 6.4          Diabetes: >6.4          Glycemic control for adults with diabetes: <7.0          Passed - Cr in normal range and within 360 days    Creatinine, Ser  Date Value Ref Range Status  09/15/2018 1.21 0.76 - 1.27 mg/dL Final         Passed - eGFR in normal range and within 360 days    GFR  calc Af Amer  Date Value Ref Range Status  09/15/2018 72 >59 mL/min/1.73 Final   GFR calc non Af Amer  Date Value Ref Range Status  09/15/2018 62 >59 mL/min/1.73 Final         Passed - Valid encounter within last 6 months    Recent Outpatient Visits          3 months  ago Essential hypertension   [facility], Rachel Elizabeth, PA-C   6 months ago Type II diabetes mellitus with neurological manifestations (HCC)   [facility], Rachel Elizabeth, PA-C   9 months ago Essential hypertension   [facility]issman, Mark A, MD   1 year ago Essential hypertension   [facility] Crissman, Mark A, MD   1 year ago Type II diabetes mellitus with neurological manifestations (HCC)   [facility] Crissman, Mark A, MD             

## 2019-01-10 ENCOUNTER — Encounter: Payer: Self-pay | Admitting: Family Medicine

## 2019-02-05 ENCOUNTER — Other Ambulatory Visit: Payer: Self-pay | Admitting: Family Medicine

## 2019-02-05 DIAGNOSIS — E785 Hyperlipidemia, unspecified: Secondary | ICD-10-CM

## 2019-02-05 DIAGNOSIS — I10 Essential (primary) hypertension: Secondary | ICD-10-CM

## 2019-02-06 ENCOUNTER — Other Ambulatory Visit: Payer: Self-pay | Admitting: Family Medicine

## 2019-02-06 DIAGNOSIS — E1149 Type 2 diabetes mellitus with other diabetic neurological complication: Secondary | ICD-10-CM

## 2019-02-11 ENCOUNTER — Encounter: Payer: Self-pay | Admitting: Family Medicine

## 2019-03-08 ENCOUNTER — Other Ambulatory Visit: Payer: Self-pay | Admitting: Family Medicine

## 2019-03-08 DIAGNOSIS — E1149 Type 2 diabetes mellitus with other diabetic neurological complication: Secondary | ICD-10-CM

## 2019-04-13 ENCOUNTER — Other Ambulatory Visit: Payer: Self-pay

## 2019-04-13 ENCOUNTER — Encounter: Payer: Self-pay | Admitting: Family Medicine

## 2019-04-13 ENCOUNTER — Ambulatory Visit: Payer: BC Managed Care – PPO | Admitting: Family Medicine

## 2019-04-13 VITALS — BP 110/75 | HR 74 | Temp 98.2°F

## 2019-04-13 DIAGNOSIS — I10 Essential (primary) hypertension: Secondary | ICD-10-CM | POA: Diagnosis not present

## 2019-04-13 DIAGNOSIS — E785 Hyperlipidemia, unspecified: Secondary | ICD-10-CM

## 2019-04-13 DIAGNOSIS — G4700 Insomnia, unspecified: Secondary | ICD-10-CM

## 2019-04-13 DIAGNOSIS — N521 Erectile dysfunction due to diseases classified elsewhere: Secondary | ICD-10-CM

## 2019-04-13 DIAGNOSIS — E1149 Type 2 diabetes mellitus with other diabetic neurological complication: Secondary | ICD-10-CM

## 2019-04-13 MED ORDER — JANUMET 50-1000 MG PO TABS: ORAL_TABLET | ORAL | 3 refills | Status: DC

## 2019-04-13 MED ORDER — TRULICITY 1.5 MG/0.5ML ~~LOC~~ SOAJ: 1.5000 mg | SUBCUTANEOUS | 4 refills | Status: DC

## 2019-04-13 MED ORDER — DULOXETINE HCL 60 MG PO CPEP: ORAL_CAPSULE | ORAL | 3 refills | Status: DC

## 2019-04-13 MED ORDER — JARDIANCE 25 MG PO TABS: 25.0000 mg | ORAL_TABLET | Freq: Every day | ORAL | 1 refills | Status: DC

## 2019-04-13 MED ORDER — AMLODIPINE BESYLATE 5 MG PO TABS: 5.0000 mg | ORAL_TABLET | Freq: Every day | ORAL | 1 refills | Status: DC

## 2019-04-13 MED ORDER — ROSUVASTATIN CALCIUM 40 MG PO TABS: 40.0000 mg | ORAL_TABLET | Freq: Every day | ORAL | 1 refills | Status: DC

## 2019-04-13 MED ORDER — BENAZEPRIL HCL 40 MG PO TABS: 40.0000 mg | ORAL_TABLET | Freq: Every day | ORAL | 1 refills | Status: DC

## 2019-04-13 NOTE — Progress Notes (Signed)
BP 110/75   Pulse 74   Temp 98.2 F (36.8 C)   SpO2 98%    Subjective:    Patient ID: Mark Allen, male    DOB: 14-Oct-1950, 68 y.o.   MRN: [id number]  HPI: Mark Allen is a 68 y.o. male  Chief Complaint  Patient presents with  . Diabetes  . Hypertension   Patient presenting today for 6 month f/u chronic conditions.   Checking home BSs - now around 130s - was drinking lots of zero sugar gatorades which was increasing his BSs but now drinking more water and notes improved BSs since then. No low blood sugar spells.   Home BPs 110-120/70s typically, taking his medications faithfully without side effects. Denies CP, SOB, HAs, dizziness.   Cholesterol diet controlled.   Insomnia - Trazodone nightly, seems to be working well for him with minimal overnight wakeups. Has not had an [place] in months.   Taking cymbalta for neuropathy related to diabetes, thinks it helps. No side effects.   ED stable on prn viagra. No concerns.   Relevant past medical, surgical, family and social history reviewed and updated as indicated. Interim medical history since our last visit reviewed. Allergies and medications reviewed and updated.  Review of Systems  Per HPI unless specifically indicated above     Objective:    BP 110/75   Pulse 74   Temp 98.2 F (36.8 C)   SpO2 98%   Wt Readings from Last 3 Encounters:  10/22/18 190 lb (86.2 kg)  09/15/18 200 lb (90.7 kg)  07/19/18 199 lb (90.3 kg)    Physical Exam Vitals signs and nursing note reviewed.  Constitutional:      Appearance: Normal appearance.  HENT:     Head: Atraumatic.  Eyes:     Extraocular Movements: Extraocular movements intact.     Conjunctiva/sclera: Conjunctivae normal.  Neck:     Musculoskeletal: Normal range of motion and neck supple.  Cardiovascular:     Rate and Rhythm: Normal rate and regular rhythm.  Pulmonary:     Effort: Pulmonary effort is normal.     Breath sounds: Normal breath sounds.   Musculoskeletal: Normal range of motion.  Skin:    General: Skin is warm and dry.  Neurological:     General: No focal deficit present.     Mental Status: He is oriented to person, place, and time.  Psychiatric:        Mood and Affect: Mood normal.        Thought Content: Thought content normal.        Judgment: Judgment normal.     Results for orders placed or performed during the hospital encounter of 10/22/18  Glucose, capillary  Result Value Ref Range   Glucose-Capillary 132 (H) 70 - 99 mg/dL  Surgical pathology  Result Value Ref Range   SURGICAL PATHOLOGY      Surgical Pathology CASE: [id number] PATIENT: Tia Alert Surgical Pathology Report     SPECIMEN SUBMITTED: A. Colon polyp, descending; cbx  CLINICAL HISTORY: None provided  PRE-OPERATIVE DIAGNOSIS: Screening colonoscopy  POST-OPERATIVE DIAGNOSIS: Colon polyp     DIAGNOSIS: A. COLON POLYP, DESCENDING; COLD BIOPSY: - TUBULAR ADENOMA. - NEGATIVE FOR HIGH GRADE DYSPLASIA AND MALIGNANCY.  Note: Multiple additional deeper recut levels were examined.   GROSS DESCRIPTION: A. Labeled: Descending colon polyp C BX Received: Formalin Tissue fragment(s): 1 Size: 0.4 cm Description: Tan soft tissue fragment Entirely submitted in 1 cassette.   Final Diagnosis  performed by Allena Napoleon, MD.   Electronically signed 10/26/2018 1:30:58PM The electronic signature indicates that the named Attending Pathologist has evaluated the specimen  Technical component performed at [facility], [address], [place], [place] [address] Lab: [phone] Dir: Rush Farmer, MD,  MMM  Professional component performed at [facility], [facility], [place], [place], [place] [address] Lab: [phone] Dir: Dellia Nims. Rubinas, MD       Assessment & Plan:   Problem List Items Addressed This Visit      Cardiovascular and Mediastinum   Hypertension - Primary    BPs stable and WNL, continue current  regimen      Relevant Medications   amLODipine (NORVASC) 5 MG tablet   benazepril (LOTENSIN) 40 MG tablet   rosuvastatin (CRESTOR) 40 MG tablet     Nervous and Auditory   Type II diabetes mellitus with neurological manifestations ([place])    Will recheck A1C, continue working on lifestyle modifications. Continue current regimen      Relevant Medications   benazepril (LOTENSIN) 40 MG tablet   Dulaglutide (TRULICITY) 1.5 KD/9.8PJ SOPN   sitaGLIPtin-metformin (JANUMET) 50-1000 MG tablet   empagliflozin (JARDIANCE) 25 MG TABS tablet   rosuvastatin (CRESTOR) 40 MG tablet   Other Relevant Orders   HgB A1c     Other   Hyperlipidemia    Currently diet controlled, recheck lipids and adjust as needed. Continue current regimen      Relevant Medications   amLODipine (NORVASC) 5 MG tablet   benazepril (LOTENSIN) 40 MG tablet   rosuvastatin (CRESTOR) 40 MG tablet   Other Relevant Orders   Lipid Panel w/o Chol/HDL Ratio   Comprehensive metabolic panel   Insomnia    Well controlled on trazodone, continue current regimen      ED (erectile dysfunction)    Stable and well controlled on prn viagra, continue current regimen          Follow up plan: Return in about 6 months (around 10/14/2019) for CPE.

## 2019-04-18 NOTE — Assessment & Plan Note (Signed)
Will recheck A1C, continue working on lifestyle modifications. Continue current regimen

## 2019-04-18 NOTE — Assessment & Plan Note (Signed)
Stable and well controlled on prn viagra, continue current regimen

## 2019-04-18 NOTE — Assessment & Plan Note (Signed)
Currently diet controlled, recheck lipids and adjust as needed. Continue current regimen

## 2019-04-18 NOTE — Assessment & Plan Note (Signed)
Well controlled on trazodone, continue current regimen

## 2019-04-18 NOTE — Assessment & Plan Note (Signed)
BPs stable and WNL, continue current regimen 

## 2019-05-23 ENCOUNTER — Other Ambulatory Visit: Payer: Self-pay

## 2019-05-23 ENCOUNTER — Other Ambulatory Visit: Payer: BC Managed Care – PPO

## 2019-05-23 DIAGNOSIS — E1149 Type 2 diabetes mellitus with other diabetic neurological complication: Secondary | ICD-10-CM

## 2019-05-23 DIAGNOSIS — E785 Hyperlipidemia, unspecified: Secondary | ICD-10-CM

## 2019-05-24 LAB — COMPREHENSIVE METABOLIC PANEL
ALT: 21 IU/L (ref 0–44)
AST: 16 IU/L (ref 0–40)
Albumin/Globulin Ratio: 1.6 (ref 1.2–2.2)
Albumin: 4.5 g/dL (ref 3.8–4.8)
Alkaline Phosphatase: 76 IU/L (ref 39–117)
BUN/Creatinine Ratio: 29 — ABNORMAL HIGH (ref 10–24)
BUN: 23 mg/dL (ref 8–27)
Bilirubin Total: 0.5 mg/dL (ref 0.0–1.2)
CO2: 22 mmol/L (ref 20–29)
Calcium: 9.2 mg/dL (ref 8.6–10.2)
Chloride: 100 mmol/L (ref 96–106)
Creatinine, Ser: 0.8 mg/dL (ref 0.76–1.27)
GFR calc Af Amer: 108 mL/min/{1.73_m2} (ref >59)
GFR calc non Af Amer: 93 mL/min/{1.73_m2} (ref >59)
Globulin, Total: 2.8 g/dL (ref 1.5–4.5)
Glucose: 224 mg/dL — ABNORMAL HIGH (ref 65–99)
Potassium: 4.6 mmol/L (ref 3.5–5.2)
Sodium: 138 mmol/L (ref 134–144)
Total Protein: 7.3 g/dL (ref 6.0–8.5)

## 2019-05-24 LAB — LIPID PANEL W/O CHOL/HDL RATIO
Cholesterol, Total: 151 mg/dL (ref 100–199)
HDL: 59 mg/dL (ref >39)
LDL Calculated: 72 mg/dL (ref 0–99)
Triglycerides: 101 mg/dL (ref 0–149)
VLDL Cholesterol Cal: 20 mg/dL (ref 5–40)

## 2019-05-24 LAB — HEMOGLOBIN A1C
Est. average glucose Bld gHb Est-mCnc: 146 mg/dL
Hgb A1c MFr Bld: 6.7 % — ABNORMAL HIGH (ref 4.8–5.6)

## 2019-05-27 ENCOUNTER — Other Ambulatory Visit: Payer: Self-pay | Admitting: Family Medicine

## 2019-05-27 DIAGNOSIS — G4700 Insomnia, unspecified: Secondary | ICD-10-CM

## 2019-05-27 NOTE — Telephone Encounter (Signed)
Forwarding medication refill request to provider for review. 

## 2019-07-01 LAB — HM DIABETES EYE EXAM

## 2019-08-29 ENCOUNTER — Encounter: Payer: Self-pay | Admitting: Family Medicine

## 2019-08-30 ENCOUNTER — Other Ambulatory Visit: Payer: Self-pay | Admitting: Family Medicine

## 2019-08-30 DIAGNOSIS — G4700 Insomnia, unspecified: Secondary | ICD-10-CM

## 2019-08-30 DIAGNOSIS — E1149 Type 2 diabetes mellitus with other diabetic neurological complication: Secondary | ICD-10-CM

## 2019-08-30 DIAGNOSIS — E785 Hyperlipidemia, unspecified: Secondary | ICD-10-CM

## 2019-08-30 DIAGNOSIS — I10 Essential (primary) hypertension: Secondary | ICD-10-CM

## 2019-08-30 MED ORDER — CONTOUR NEXT TEST VI STRP: ORAL_STRIP | 11 refills | Status: DC

## 2019-08-30 MED ORDER — DULOXETINE HCL 60 MG PO CPEP: ORAL_CAPSULE | ORAL | 3 refills | Status: DC

## 2019-08-30 MED ORDER — JANUMET 50-1000 MG PO TABS: ORAL_TABLET | ORAL | 3 refills | Status: DC

## 2019-08-30 MED ORDER — ROSUVASTATIN CALCIUM 40 MG PO TABS: 40.0000 mg | ORAL_TABLET | Freq: Every day | ORAL | 1 refills | Status: DC

## 2019-08-30 MED ORDER — AMLODIPINE BESYLATE 5 MG PO TABS: 5.0000 mg | ORAL_TABLET | Freq: Every day | ORAL | 1 refills | Status: DC

## 2019-08-30 MED ORDER — TRAZODONE HCL 50 MG PO TABS: 50.0000 mg | ORAL_TABLET | Freq: Every evening | ORAL | 1 refills | Status: DC | PRN

## 2019-08-30 MED ORDER — JARDIANCE 25 MG PO TABS: 25.0000 mg | ORAL_TABLET | Freq: Every day | ORAL | 1 refills | Status: DC

## 2019-08-30 MED ORDER — BENAZEPRIL HCL 40 MG PO TABS: 40.0000 mg | ORAL_TABLET | Freq: Every day | ORAL | 1 refills | Status: DC

## 2019-08-30 MED ORDER — SILDENAFIL CITRATE 100 MG PO TABS: ORAL_TABLET | ORAL | 12 refills | Status: DC

## 2019-08-30 MED ORDER — TRULICITY 1.5 MG/0.5ML ~~LOC~~ SOAJ: 1.5000 mg | SUBCUTANEOUS | 4 refills | Status: DC

## 2019-09-02 ENCOUNTER — Telehealth: Payer: Self-pay

## 2019-09-02 NOTE — Telephone Encounter (Signed)
PA for Contour Next Test Strips initiated and submitted via Cover My Meds. Key:  [id number]

## 2019-09-05 NOTE — Telephone Encounter (Signed)
Medication does not require PA according to Cover My Meds.

## 2019-10-17 ENCOUNTER — Encounter: Payer: BC Managed Care – PPO | Admitting: Family Medicine

## 2019-11-03 ENCOUNTER — Other Ambulatory Visit: Payer: Self-pay | Admitting: Family Medicine

## 2019-11-03 DIAGNOSIS — E1149 Type 2 diabetes mellitus with other diabetic neurological complication: Secondary | ICD-10-CM

## 2019-11-20 ENCOUNTER — Other Ambulatory Visit: Payer: Self-pay | Admitting: Family Medicine

## 2019-11-20 DIAGNOSIS — G4700 Insomnia, unspecified: Secondary | ICD-10-CM

## 2019-11-21 NOTE — Telephone Encounter (Signed)
Called and left a message asking patient to call the office to schedule an office visit.

## 2019-11-22 ENCOUNTER — Encounter: Payer: Self-pay | Admitting: Family Medicine

## 2019-11-22 NOTE — Telephone Encounter (Signed)
Appt scheduled

## 2019-11-24 ENCOUNTER — Other Ambulatory Visit: Payer: Self-pay

## 2019-11-24 ENCOUNTER — Ambulatory Visit (INDEPENDENT_AMBULATORY_CARE_PROVIDER_SITE_OTHER): Payer: Medicare HMO | Admitting: Family Medicine

## 2019-11-24 ENCOUNTER — Ambulatory Visit: Payer: BC Managed Care – PPO | Admitting: Nurse Practitioner

## 2019-11-24 ENCOUNTER — Encounter: Payer: Self-pay | Admitting: Family Medicine

## 2019-11-24 VITALS — BP 111/73 | HR 65 | Temp 98.3°F | Ht 70.0 in | Wt 197.0 lb

## 2019-11-24 DIAGNOSIS — I10 Essential (primary) hypertension: Secondary | ICD-10-CM

## 2019-11-24 DIAGNOSIS — G4700 Insomnia, unspecified: Secondary | ICD-10-CM

## 2019-11-24 DIAGNOSIS — E1159 Type 2 diabetes mellitus with other circulatory complications: Secondary | ICD-10-CM | POA: Diagnosis not present

## 2019-11-24 DIAGNOSIS — E1149 Type 2 diabetes mellitus with other diabetic neurological complication: Secondary | ICD-10-CM | POA: Diagnosis not present

## 2019-11-24 DIAGNOSIS — N521 Erectile dysfunction due to diseases classified elsewhere: Secondary | ICD-10-CM | POA: Diagnosis not present

## 2019-11-24 DIAGNOSIS — E785 Hyperlipidemia, unspecified: Secondary | ICD-10-CM

## 2019-11-24 DIAGNOSIS — I152 Hypertension secondary to endocrine disorders: Secondary | ICD-10-CM

## 2019-11-24 MED ORDER — ZOLPIDEM TARTRATE 10 MG PO TABS: 10.0000 mg | ORAL_TABLET | Freq: Every evening | ORAL | 1 refills | Status: DC | PRN

## 2019-11-24 MED ORDER — JARDIANCE 25 MG PO TABS: 25.0000 mg | ORAL_TABLET | Freq: Every day | ORAL | 0 refills | Status: DC

## 2019-11-24 NOTE — Assessment & Plan Note (Signed)
Recheck lipids, adjust as needed. Continue current regimen 

## 2019-11-24 NOTE — Assessment & Plan Note (Signed)
Recheck A1C, adjust as needed. Continue current regimen and work on diet modifications

## 2019-11-24 NOTE — Progress Notes (Signed)
BP 111/73   Pulse 65   Temp 98.3 F (36.8 C) (Oral)   Ht 5\' 10"  (1.778 m)   Wt 197 lb (89.4 kg)   SpO2 96%   BMI 28.27 kg/m    Subjective:    Patient ID: [name], male    DOB: 1951-03-04, 69 y.o.   MRN: [id number]  HPI: ASAAD WOHLER is a 69 y.o. male  Chief Complaint  Patient presents with  . Diabetes  . Hypertension  . Hyperlipidemia  . Insomnia    ambien refill requested   Patient presenting today for 6 month f/u.   ED - Insurance no longer covering sildenafil for him, having a hard time being without the medication.   HTN - Not checking home BPs. Taking medications faithfully without side effects. Denies CP, SOB, HAs, dizziness.   DM - Diet has not been good lately. Home BSs in the AM 130s-140s, though in the 160s-170s the past week due to diet. No low blood sugar spells.   HLD - Tolerating crestor well, denies side effects. Denies CP, SOB, HAs, dizziness.   Insomnia - Taking trazodone nightly, ambien as needed. Doing well on this regimen. Has not needed a refill on ambien in 3 years, nearly out now.   Relevant past medical, surgical, family and social history reviewed and updated as indicated. Interim medical history since our last visit reviewed. Allergies and medications reviewed and updated.  Review of Systems  Per HPI unless specifically indicated above     Objective:    BP 111/73   Pulse 65   Temp 98.3 F (36.8 C) (Oral)   Ht 5\' 10"  (1.778 m)   Wt 197 lb (89.4 kg)   SpO2 96%   BMI 28.27 kg/m   Wt Readings from Last 3 Encounters:  11/24/19 197 lb (89.4 kg)  10/22/18 190 lb (86.2 kg)  09/15/18 200 lb (90.7 kg)    Physical Exam Vitals and nursing note reviewed.  Constitutional:      Appearance: Normal appearance.  HENT:     Head: Atraumatic.  Eyes:     Extraocular Movements: Extraocular movements intact.     Conjunctiva/sclera: Conjunctivae normal.  Cardiovascular:     Rate and Rhythm: Normal rate and regular rhythm.  Pulmonary:       Effort: Pulmonary effort is normal.     Breath sounds: Normal breath sounds.  Musculoskeletal:        General: Normal range of motion.     Cervical back: Normal range of motion and neck supple.  Skin:    General: Skin is warm and dry.  Neurological:     General: No focal deficit present.     Mental Status: He is oriented to person, place, and time.  Psychiatric:        Mood and Affect: Mood normal.        Thought Content: Thought content normal.        Judgment: Judgment normal.     Results for orders placed or performed in visit on 07/06/19  HM DIABETES EYE EXAM  Result Value Ref Range   HM Diabetic Eye Exam No Retinopathy No Retinopathy      Assessment & Plan:   Problem List Items Addressed This Visit      Cardiovascular and Mediastinum   Hypertension associated with diabetes ([place]) - Primary    BPs stable and under good control. Continue current regimen      Relevant Medications   empagliflozin (JARDIANCE)  25 MG TABS tablet   Other Relevant Orders   CBC with Differential/Platelet   Comprehensive metabolic panel     Endocrine   Type II diabetes mellitus with neurological manifestations (HCC)    Recheck A1C, adjust as needed. Continue current regimen and work on diet modifications      Relevant Medications   empagliflozin (JARDIANCE) 25 MG TABS tablet   Other Relevant Orders   HgB A1c     Other   Hyperlipidemia    Recheck lipids, adjust as needed. Continue current regimen      Relevant Orders   Lipid Panel w/o Chol/HDL Ratio   Insomnia    Stable and under good control, continue current regimen      ED (erectile dysfunction)    Pt to research pharmacy options to obtain affordable ED medication, he will let us know what he wants to do about his medication          Follow up plan: Return in about 6 months (around 05/23/2020) for CPE.

## 2019-11-24 NOTE — Assessment & Plan Note (Signed)
Stable and under good control, continue current regimen 

## 2019-11-24 NOTE — Assessment & Plan Note (Signed)
BPs stable and under good control. Continue current regimen 

## 2019-11-24 NOTE — Assessment & Plan Note (Signed)
Pt to research pharmacy options to obtain affordable ED medication, he will let us know what he wants to do about his medication

## 2019-11-25 LAB — LIPID PANEL W/O CHOL/HDL RATIO
Cholesterol, Total: 159 mg/dL (ref 100–199)
HDL: 60 mg/dL (ref >39)
LDL Chol Calc (NIH): 82 mg/dL (ref 0–99)
Triglycerides: 94 mg/dL (ref 0–149)
VLDL Cholesterol Cal: 17 mg/dL (ref 5–40)

## 2019-11-25 LAB — CBC WITH DIFFERENTIAL/PLATELET
Basophils Absolute: 0 10*3/uL (ref 0.0–0.2)
Basos: 1 %
EOS (ABSOLUTE): 0.1 10*3/uL (ref 0.0–0.4)
Eos: 2 %
Hematocrit: 45 % (ref 37.5–51.0)
Hemoglobin: 15.4 g/dL (ref 13.0–17.7)
Immature Grans (Abs): 0 10*3/uL (ref 0.0–0.1)
Immature Granulocytes: 0 %
Lymphocytes Absolute: 1.5 10*3/uL (ref 0.7–3.1)
Lymphs: 24 %
MCH: 31.2 pg (ref 26.6–33.0)
MCHC: 34.2 g/dL (ref 31.5–35.7)
MCV: 91 fL (ref 79–97)
Monocytes Absolute: 0.7 10*3/uL (ref 0.1–0.9)
Monocytes: 11 %
Neutrophils Absolute: 4.1 10*3/uL (ref 1.4–7.0)
Neutrophils: 62 %
Platelets: 337 10*3/uL (ref 150–450)
RBC: 4.94 x10E6/uL (ref 4.14–5.80)
RDW: 13.1 % (ref 11.6–15.4)
WBC: 6.5 10*3/uL (ref 3.4–10.8)

## 2019-11-25 LAB — COMPREHENSIVE METABOLIC PANEL
ALT: 21 IU/L (ref 0–44)
AST: 16 IU/L (ref 0–40)
Albumin/Globulin Ratio: 1.9 (ref 1.2–2.2)
Albumin: 4.5 g/dL (ref 3.8–4.8)
Alkaline Phosphatase: 65 IU/L (ref 39–117)
BUN/Creatinine Ratio: 20 (ref 10–24)
BUN: 19 mg/dL (ref 8–27)
Bilirubin Total: 0.3 mg/dL (ref 0.0–1.2)
CO2: 21 mmol/L (ref 20–29)
Calcium: 9.7 mg/dL (ref 8.6–10.2)
Chloride: 103 mmol/L (ref 96–106)
Creatinine, Ser: 0.95 mg/dL (ref 0.76–1.27)
GFR calc Af Amer: 96 mL/min/{1.73_m2} (ref >59)
GFR calc non Af Amer: 83 mL/min/{1.73_m2} (ref >59)
Globulin, Total: 2.4 g/dL (ref 1.5–4.5)
Glucose: 151 mg/dL — ABNORMAL HIGH (ref 65–99)
Potassium: 4.7 mmol/L (ref 3.5–5.2)
Sodium: 138 mmol/L (ref 134–144)
Total Protein: 6.9 g/dL (ref 6.0–8.5)

## 2019-11-25 LAB — HEMOGLOBIN A1C
Est. average glucose Bld gHb Est-mCnc: 140 mg/dL
Hgb A1c MFr Bld: 6.5 % — ABNORMAL HIGH (ref 4.8–5.6)

## 2019-12-08 ENCOUNTER — Encounter: Payer: Self-pay | Admitting: Family Medicine

## 2019-12-08 ENCOUNTER — Other Ambulatory Visit: Payer: Self-pay | Admitting: Family Medicine

## 2019-12-08 DIAGNOSIS — Z85828 Personal history of other malignant neoplasm of skin: Secondary | ICD-10-CM | POA: Diagnosis not present

## 2019-12-08 DIAGNOSIS — G4700 Insomnia, unspecified: Secondary | ICD-10-CM

## 2019-12-08 DIAGNOSIS — L308 Other specified dermatitis: Secondary | ICD-10-CM | POA: Diagnosis not present

## 2019-12-08 MED ORDER — TRAZODONE HCL 50 MG PO TABS: 50.0000 mg | ORAL_TABLET | Freq: Every evening | ORAL | 1 refills | Status: DC | PRN

## 2020-01-10 ENCOUNTER — Encounter: Payer: Self-pay | Admitting: Family Medicine

## 2020-01-10 DIAGNOSIS — Z79899 Other long term (current) drug therapy: Secondary | ICD-10-CM | POA: Diagnosis not present

## 2020-01-10 DIAGNOSIS — Z882 Allergy status to sulfonamides status: Secondary | ICD-10-CM | POA: Diagnosis not present

## 2020-01-10 DIAGNOSIS — E119 Type 2 diabetes mellitus without complications: Secondary | ICD-10-CM | POA: Diagnosis not present

## 2020-01-10 DIAGNOSIS — Z87891 Personal history of nicotine dependence: Secondary | ICD-10-CM | POA: Diagnosis not present

## 2020-01-10 DIAGNOSIS — H90A22 Sensorineural hearing loss, unilateral, left ear, with restricted hearing on the contralateral side: Secondary | ICD-10-CM | POA: Diagnosis not present

## 2020-01-10 DIAGNOSIS — I10 Essential (primary) hypertension: Secondary | ICD-10-CM | POA: Diagnosis not present

## 2020-01-10 DIAGNOSIS — H9312 Tinnitus, left ear: Secondary | ICD-10-CM | POA: Diagnosis not present

## 2020-01-10 DIAGNOSIS — R42 Dizziness and giddiness: Secondary | ICD-10-CM | POA: Diagnosis not present

## 2020-01-10 DIAGNOSIS — Z6828 Body mass index (BMI) 28.0-28.9, adult: Secondary | ICD-10-CM | POA: Diagnosis not present

## 2020-01-10 DIAGNOSIS — H9192 Unspecified hearing loss, left ear: Secondary | ICD-10-CM | POA: Diagnosis not present

## 2020-01-10 DIAGNOSIS — Z7984 Long term (current) use of oral hypoglycemic drugs: Secondary | ICD-10-CM | POA: Diagnosis not present

## 2020-01-11 ENCOUNTER — Encounter: Payer: Self-pay | Admitting: Family Medicine

## 2020-01-23 ENCOUNTER — Encounter: Payer: Self-pay | Admitting: Family Medicine

## 2020-01-25 ENCOUNTER — Encounter: Payer: Self-pay | Admitting: Family Medicine

## 2020-01-26 DIAGNOSIS — H9312 Tinnitus, left ear: Secondary | ICD-10-CM | POA: Diagnosis not present

## 2020-01-26 DIAGNOSIS — Z0110 Encounter for examination of ears and hearing without abnormal findings: Secondary | ICD-10-CM | POA: Diagnosis not present

## 2020-01-27 ENCOUNTER — Encounter: Payer: Self-pay | Admitting: Family Medicine

## 2020-01-30 ENCOUNTER — Encounter: Payer: Self-pay | Admitting: Family Medicine

## 2020-02-02 ENCOUNTER — Encounter: Payer: Self-pay | Admitting: Family Medicine

## 2020-02-09 ENCOUNTER — Other Ambulatory Visit: Payer: Self-pay

## 2020-02-09 ENCOUNTER — Encounter: Payer: Self-pay | Admitting: Family Medicine

## 2020-02-09 ENCOUNTER — Ambulatory Visit (INDEPENDENT_AMBULATORY_CARE_PROVIDER_SITE_OTHER): Payer: Medicare HMO | Admitting: Family Medicine

## 2020-02-09 VITALS — BP 136/80 | HR 67 | Temp 98.2°F | Ht 70.0 in | Wt 197.2 lb

## 2020-02-09 DIAGNOSIS — I10 Essential (primary) hypertension: Secondary | ICD-10-CM | POA: Diagnosis not present

## 2020-02-09 DIAGNOSIS — E785 Hyperlipidemia, unspecified: Secondary | ICD-10-CM | POA: Diagnosis not present

## 2020-02-09 DIAGNOSIS — G4700 Insomnia, unspecified: Secondary | ICD-10-CM | POA: Diagnosis not present

## 2020-02-09 DIAGNOSIS — E1149 Type 2 diabetes mellitus with other diabetic neurological complication: Secondary | ICD-10-CM | POA: Diagnosis not present

## 2020-02-09 DIAGNOSIS — N521 Erectile dysfunction due to diseases classified elsewhere: Secondary | ICD-10-CM

## 2020-02-09 MED ORDER — TRAZODONE HCL 50 MG PO TABS: 50.0000 mg | ORAL_TABLET | Freq: Every evening | ORAL | 1 refills | Status: DC | PRN

## 2020-02-09 MED ORDER — AMLODIPINE BESYLATE 5 MG PO TABS: 5.0000 mg | ORAL_TABLET | Freq: Every day | ORAL | 1 refills | Status: DC

## 2020-02-09 MED ORDER — ROSUVASTATIN CALCIUM 40 MG PO TABS: 40.0000 mg | ORAL_TABLET | Freq: Every day | ORAL | 1 refills | Status: DC

## 2020-02-09 MED ORDER — DULOXETINE HCL 60 MG PO CPEP: ORAL_CAPSULE | ORAL | 3 refills | Status: DC

## 2020-02-09 MED ORDER — PEN NEEDLES 32G X 4 MM MISC: 1.0000 [IU] | Freq: Two times a day (BID) | 3 refills | Status: DC | PRN

## 2020-02-09 MED ORDER — METFORMIN HCL 1000 MG PO TABS: 1000.0000 mg | ORAL_TABLET | Freq: Two times a day (BID) | ORAL | 1 refills | Status: DC

## 2020-02-09 MED ORDER — ZOLPIDEM TARTRATE 10 MG PO TABS: 10.0000 mg | ORAL_TABLET | Freq: Every evening | ORAL | 1 refills | Status: DC | PRN

## 2020-02-09 MED ORDER — JARDIANCE 25 MG PO TABS: 25.0000 mg | ORAL_TABLET | Freq: Every day | ORAL | 1 refills | Status: DC

## 2020-02-09 MED ORDER — BENAZEPRIL HCL 40 MG PO TABS: 40.0000 mg | ORAL_TABLET | Freq: Every day | ORAL | 1 refills | Status: DC

## 2020-02-09 MED ORDER — TRESIBA FLEXTOUCH 100 UNIT/ML ~~LOC~~ SOPN: 15.0000 [IU] | PEN_INJECTOR | Freq: Every day | SUBCUTANEOUS | 0 refills | Status: DC

## 2020-02-09 NOTE — Progress Notes (Signed)
BP 136/80   Pulse 67   Temp 98.2 F (36.8 C)   Ht 5\' 10"  (1.778 m)   Wt 197 lb 4 oz (89.5 kg)   SpO2 96%   BMI 28.30 kg/m    Subjective:    Patient ID: Mark Allen, male    DOB: 1951-02-24, 69 y.o.   MRN: [id number]  HPI: Mark Allen is a 69 y.o. male  Chief Complaint  Patient presents with  . Medication Problem    Trulicity and Janumet are extremely expensive due to being in the donut hole   Presenting today to discuss med changes for his diabetes. Has been under good control on recent lab checks, but due to insurance/cost issues wanting to come off trulicity and janumet and opting for insulin given cost profile with his plan (per his recent discussion with a representative for his plan). Wanting to stay on jardiance and plain metformin additionally. Still trying to eat well and stay active. Not regularly checking home BSs.   Relevant past medical, surgical, family and social history reviewed and updated as indicated. Interim medical history since our last visit reviewed. Allergies and medications reviewed and updated.  Review of Systems  Per HPI unless specifically indicated above     Objective:    BP 136/80   Pulse 67   Temp 98.2 F (36.8 C)   Ht 5\' 10"  (1.778 m)   Wt 197 lb 4 oz (89.5 kg)   SpO2 96%   BMI 28.30 kg/m   Wt Readings from Last 3 Encounters:  02/09/20 197 lb 4 oz (89.5 kg)  11/24/19 197 lb (89.4 kg)  10/22/18 190 lb (86.2 kg)    Physical Exam Vitals and nursing note reviewed.  Constitutional:      Appearance: Normal appearance.  HENT:     Head: Atraumatic.  Eyes:     Extraocular Movements: Extraocular movements intact.     Conjunctiva/sclera: Conjunctivae normal.  Cardiovascular:     Rate and Rhythm: Normal rate and regular rhythm.  Pulmonary:     Effort: Pulmonary effort is normal.     Breath sounds: Normal breath sounds.  Musculoskeletal:        General: Normal range of motion.     Cervical back: Normal range of motion and neck  supple.  Skin:    General: Skin is warm and dry.  Neurological:     General: No focal deficit present.     Mental Status: He is oriented to person, place, and time.  Psychiatric:        Mood and Affect: Mood normal.        Thought Content: Thought content normal.        Judgment: Judgment normal.     Results for orders placed or performed in visit on 11/24/19  CBC with Differential/Platelet  Result Value Ref Range   WBC 6.5 3.4 - 10.8 x10E3/uL   RBC 4.94 4.14 - 5.80 x10E6/uL   Hemoglobin 15.4 13.0 - 17.7 g/dL   Hematocrit 45.0 37.5 - 51.0 %   MCV 91 79 - 97 fL   MCH 31.2 26.6 - 33.0 pg   MCHC 34.2 31.5 - 35.7 g/dL   RDW 13.1 11.6 - 15.4 %   Platelets 337 150 - 450 x10E3/uL   Neutrophils 62 Not Estab. %   Lymphs 24 Not Estab. %   Monocytes 11 Not Estab. %   Eos 2 Not Estab. %   Basos 1 Not Estab. %  Neutrophils Absolute 4.1 1.4 - 7.0 x10E3/uL   Lymphocytes Absolute 1.5 0.7 - 3.1 x10E3/uL   Monocytes Absolute 0.7 0.1 - 0.9 x10E3/uL   EOS (ABSOLUTE) 0.1 0.0 - 0.4 x10E3/uL   Basophils Absolute 0.0 0.0 - 0.2 x10E3/uL   Immature Granulocytes 0 Not Estab. %   Immature Grans (Abs) 0.0 0.0 - 0.1 x10E3/uL  Comprehensive metabolic panel  Result Value Ref Range   Glucose 151 (H) 65 - 99 mg/dL   BUN 19 8 - 27 mg/dL   Creatinine, Ser 0.95 0.76 - 1.27 mg/dL   GFR calc non Af Amer 83 >59 mL/min/1.73   GFR calc Af Amer 96 >59 mL/min/1.73   BUN/Creatinine Ratio 20 10 - 24   Sodium 138 134 - 144 mmol/L   Potassium 4.7 3.5 - 5.2 mmol/L   Chloride 103 96 - 106 mmol/L   CO2 21 20 - 29 mmol/L   Calcium 9.7 8.6 - 10.2 mg/dL   Total Protein 6.9 6.0 - 8.5 g/dL   Albumin 4.5 3.8 - 4.8 g/dL   Globulin, Total 2.4 1.5 - 4.5 g/dL   Albumin/Globulin Ratio 1.9 1.2 - 2.2   Bilirubin Total 0.3 0.0 - 1.2 mg/dL   Alkaline Phosphatase 65 39 - 117 IU/L   AST 16 0 - 40 IU/L   ALT 21 0 - 44 IU/L  Lipid Panel w/o Chol/HDL Ratio  Result Value Ref Range   Cholesterol, Total 159 100 - 199 mg/dL    Triglycerides 94 0 - 149 mg/dL   HDL 60 >39 mg/dL   VLDL Cholesterol Cal 17 5 - 40 mg/dL   LDL Chol Calc (NIH) 82 0 - 99 mg/dL  HgB A1c  Result Value Ref Range   Hgb A1c MFr Bld 6.5 (H) 4.8 - 5.6 %   Est. average glucose Bld gHb Est-mCnc 140 mg/dL      Assessment & Plan:   Problem List Items Addressed This Visit      Endocrine   Type II diabetes mellitus with neurological manifestations ([place]) - Primary    D/c trulicity and janumet due to cost, replace with tresiba low dose with close monitoring. Reviewed risks and precautions, close BS monitoring with goal ranges reviewed. Plain metformin sent in additionally in place of the janumet        Other   Hyperlipidemia    Stable on crestor, requesting rx's transferred to mail order pharmacy due to coverage      Insomnia    Stable well controlled, wanting rx's sent to mail order due to coverage      ED (erectile dysfunction)    Unable to afford medication, discussed some alternative options for getting medication for reduced prices as self pay. He will do some research and let us know if/where he wants refill sent       Other Visit Diagnoses    Essential hypertension           Follow up plan: Return in about 4 weeks (around 03/08/2020) for DM f/u.

## 2020-02-14 ENCOUNTER — Encounter: Payer: Self-pay | Admitting: Family Medicine

## 2020-02-17 ENCOUNTER — Telehealth: Payer: Self-pay | Admitting: Family Medicine

## 2020-02-17 NOTE — Telephone Encounter (Signed)
Per initial request:  Jaynie Collins D 1 hour ago (10:53 AM)  [facility] needs clarification on insulin degludec (TRESIBA FLEXTOUCH) 100 UNIT/ML FlexTouch Pen, states it comes in 5-pack and wants to know how many should be given to the patient.        Documentation   [place], [place] - [place], [place]   Will route to office for final disposition.

## 2020-02-17 NOTE — Telephone Encounter (Signed)
Routing to provider  

## 2020-02-17 NOTE — Telephone Encounter (Signed)
Pharmacy needs clarification on insulin degludec (TRESIBA FLEXTOUCH) 100 UNIT/ML FlexTouch Pen, states it comes in 5-pack and wants to know how many should be given to the patient.

## 2020-02-19 ENCOUNTER — Other Ambulatory Visit: Payer: Self-pay | Admitting: Family Medicine

## 2020-02-19 DIAGNOSIS — G4700 Insomnia, unspecified: Secondary | ICD-10-CM

## 2020-02-19 DIAGNOSIS — I10 Essential (primary) hypertension: Secondary | ICD-10-CM

## 2020-02-19 DIAGNOSIS — E785 Hyperlipidemia, unspecified: Secondary | ICD-10-CM

## 2020-02-19 MED ORDER — TRESIBA FLEXTOUCH 100 UNIT/ML ~~LOC~~ SOPN: 15.0000 [IU] | PEN_INJECTOR | Freq: Every day | SUBCUTANEOUS | 0 refills | Status: DC

## 2020-02-19 MED ORDER — ROSUVASTATIN CALCIUM 40 MG PO TABS: 40.0000 mg | ORAL_TABLET | Freq: Every day | ORAL | 1 refills | Status: DC

## 2020-02-19 MED ORDER — METFORMIN HCL 1000 MG PO TABS: 1000.0000 mg | ORAL_TABLET | Freq: Two times a day (BID) | ORAL | 1 refills | Status: DC

## 2020-02-19 MED ORDER — JARDIANCE 25 MG PO TABS: 25.0000 mg | ORAL_TABLET | Freq: Every day | ORAL | 1 refills | Status: DC

## 2020-02-19 MED ORDER — BENAZEPRIL HCL 40 MG PO TABS: 40.0000 mg | ORAL_TABLET | Freq: Every day | ORAL | 1 refills | Status: DC

## 2020-02-19 MED ORDER — AMLODIPINE BESYLATE 5 MG PO TABS: 5.0000 mg | ORAL_TABLET | Freq: Every day | ORAL | 1 refills | Status: DC

## 2020-02-19 MED ORDER — TRAZODONE HCL 50 MG PO TABS: 50.0000 mg | ORAL_TABLET | Freq: Every evening | ORAL | 1 refills | Status: DC | PRN

## 2020-02-19 MED ORDER — DULOXETINE HCL 60 MG PO CPEP: ORAL_CAPSULE | ORAL | 3 refills | Status: DC

## 2020-02-19 MED ORDER — ZOLPIDEM TARTRATE 10 MG PO TABS: 10.0000 mg | ORAL_TABLET | Freq: Every evening | ORAL | 1 refills | Status: DC | PRN

## 2020-02-20 NOTE — Assessment & Plan Note (Signed)
Unable to afford medication, discussed some alternative options for getting medication for reduced prices as self pay. He will do some research and let us know if/where he wants refill sent

## 2020-02-20 NOTE — Assessment & Plan Note (Signed)
Stable on crestor, requesting rx's transferred to mail order pharmacy due to coverage

## 2020-02-20 NOTE — Assessment & Plan Note (Signed)
Stable well controlled, wanting rx's sent to mail order due to coverage

## 2020-02-20 NOTE — Assessment & Plan Note (Addendum)
D/c trulicity and janumet due to cost, replace with tresiba low dose with close monitoring. Reviewed risks and precautions, close BS monitoring with goal ranges reviewed. Plain metformin sent in additionally in place of the janumet

## 2020-02-21 MED ORDER — TRESIBA FLEXTOUCH 100 UNIT/ML ~~LOC~~ SOPN: 15.0000 [IU] | PEN_INJECTOR | Freq: Every day | SUBCUTANEOUS | 0 refills | Status: DC

## 2020-02-21 NOTE — Telephone Encounter (Signed)
Rx resent.

## 2020-02-21 NOTE — Telephone Encounter (Signed)
Called pharmacy back. They discontinued the order so they need a new one sent in for 15 mL to equal the 5 pens.

## 2020-02-21 NOTE — Telephone Encounter (Signed)
15 pens should be fine

## 2020-02-21 NOTE — Addendum Note (Signed)
Addended by: Merrie Roof E on: 02/21/2020 12:45 PM   Modules accepted: Orders

## 2020-02-23 ENCOUNTER — Other Ambulatory Visit: Payer: Self-pay | Admitting: Family Medicine

## 2020-02-23 NOTE — Telephone Encounter (Signed)
Medication Refill - Medication: insulin degludec (TRESIBA FLEXTOUCH) 100 UNIT/ML FlexTouch Pen   Has the patient contacted their pharmacy? Yes.   (Agent: If no, request that the patient contact the pharmacy for the refill.) (Agent: If yes, when and what did the pharmacy advise?)  Preferred Pharmacy (with phone number or street name):  [facility]  [place] [address]  Phone: [phone] Fax: [phone]     Agent: Please be advised that RX refills may take up to 3 business days. We ask that you follow-up with your pharmacy.

## 2020-02-29 ENCOUNTER — Other Ambulatory Visit: Payer: Self-pay | Admitting: Family Medicine

## 2020-02-29 DIAGNOSIS — E785 Hyperlipidemia, unspecified: Secondary | ICD-10-CM

## 2020-02-29 DIAGNOSIS — I10 Essential (primary) hypertension: Secondary | ICD-10-CM

## 2020-02-29 NOTE — Telephone Encounter (Signed)
Requested Prescriptions  Pending Prescriptions Disp Refills  . amLODipine (NORVASC) 5 MG tablet [Pharmacy Med Name: AMLODIPINE BESYLATE 5MG  TABLETS] 90 tablet 1    Sig: TAKE 1 TABLET(5 MG) BY MOUTH DAILY     Cardiovascular:  Calcium Channel Blockers Passed - 02/29/2020  3:25 AM      Passed - Last BP in normal range    BP Readings from Last 1 Encounters:  02/09/20 136/80         Passed - Valid encounter within last 6 months    Recent Outpatient Visits          2 weeks ago Type II diabetes mellitus with neurological manifestations [facility])   [facility] [name], [place]   3 months ago Hypertension associated with diabetes [facility])   [place], [place], [place]   10 months ago Essential hypertension   [place], [place], [place]   1 year ago Essential hypertension   [facility] Merrie Roof [place], [place]   1 year ago Type II diabetes mellitus with neurological manifestations [facility])   [place], [place], [place]      Future Appointments            In 2 months [facility] Desanctis, Lilia Argue, [place], [facility]           . benazepril (LOTENSIN) 40 MG tablet [Pharmacy Med Name: BENAZEPRIL 40MG  TABLETS] 90 tablet 1    Sig: TAKE 1 TABLET(40 MG) BY MOUTH DAILY     Cardiovascular:  ACE Inhibitors Passed - 02/29/2020  3:25 AM      Passed - Cr in normal range and within 180 days    Creatinine, Ser  Date Value Ref Range Status  11/24/2019 0.95 0.76 - 1.27 mg/dL Final         Passed - K in normal range and within 180 days    Potassium  Date Value Ref Range Status  11/24/2019 4.7 3.5 - 5.2 mmol/L Final         Passed - Patient is not pregnant      Passed - Last BP in normal range    BP Readings from Last 1 Encounters:  02/09/20 136/80         Passed - Valid encounter within last 6 months    Recent Outpatient Visits          2 weeks ago Type II diabetes  mellitus with neurological manifestations [facility])   [facility] [name], PA-C   3 months ago Hypertension associated with diabetes [facility])   [facility] [name], [place]   10 months ago Essential hypertension   [facility] [name], [place]   1 year ago Essential hypertension   [facility] Merrie Roof [place], [place]   1 year ago Type II diabetes mellitus with neurological manifestations [facility])   [facility] [name], [place]      Future Appointments            In 2 months Orene Desanctis, Lilia Argue, [place], [place]           . rosuvastatin (CRESTOR) 40 MG tablet [Pharmacy Med Name: ROSUVASTATIN 40MG  TABLETS] 90 tablet 1    Sig: TAKE 1 TABLET(40 MG) BY MOUTH DAILY     Cardiovascular:  Antilipid - Statins Passed - 02/29/2020  3:25 AM      Passed - Total Cholesterol in  normal range and within 360 days    Cholesterol, Total  Date Value Ref Range Status  11/24/2019 159 100 - 199 mg/dL Final   Cholesterol Piccolo, Waived  Date Value Ref Range Status  03/23/2017 145 <200 mg/dL Final    Comment:                            Desirable                <200                         Borderline High      200- 239                         High                     >239          Passed - LDL in normal range and within 360 days    LDL Chol Calc (NIH)  Date Value Ref Range Status  11/24/2019 82 0 - 99 mg/dL Final         Passed - HDL in normal range and within 360 days    HDL  Date Value Ref Range Status  11/24/2019 60 >39 mg/dL Final         Passed - Triglycerides in normal range and within 360 days    Triglycerides  Date Value Ref Range Status  11/24/2019 94 0 - 149 mg/dL Final   Triglycerides Piccolo,Waived  Date Value Ref Range Status  03/23/2017 74 <150 mg/dL Final    Comment:                            Normal                   <150                          Borderline High     150 - 199                         High                200 - 499                         Very High                >499          Passed - Patient is not pregnant      Passed - Valid encounter within last 12 months    Recent Outpatient Visits          2 weeks ago Type II diabetes mellitus with neurological manifestations [facility])   [place], [place], [place]   3 months ago Hypertension associated with diabetes [facility])   [facility] [name], [place]   10 months ago Essential hypertension   [facility] [name], [place]   1 year ago Essential hypertension   [facility] Merrie Roof [place], [place]   1 year ago Type II diabetes mellitus with neurological manifestations [facility])   [place], [place],  PA-C      Future Appointments            In 2 months [name], PA-C [facility], [facility]           Previous scripts from 02/19/2020 went to [facility].  These are being sent to local [facility] in [place], [place].

## 2020-05-25 ENCOUNTER — Ambulatory Visit (INDEPENDENT_AMBULATORY_CARE_PROVIDER_SITE_OTHER): Payer: Medicare HMO | Admitting: Family Medicine

## 2020-05-25 ENCOUNTER — Other Ambulatory Visit: Payer: Self-pay

## 2020-05-25 ENCOUNTER — Encounter: Payer: Self-pay | Admitting: Family Medicine

## 2020-05-25 VITALS — BP 119/78 | HR 72 | Temp 98.9°F | Ht 70.0 in | Wt 201.2 lb

## 2020-05-25 DIAGNOSIS — S40812A Abrasion of left upper arm, initial encounter: Secondary | ICD-10-CM

## 2020-05-25 DIAGNOSIS — N521 Erectile dysfunction due to diseases classified elsewhere: Secondary | ICD-10-CM | POA: Diagnosis not present

## 2020-05-25 DIAGNOSIS — Z23 Encounter for immunization: Secondary | ICD-10-CM

## 2020-05-25 DIAGNOSIS — E1149 Type 2 diabetes mellitus with other diabetic neurological complication: Secondary | ICD-10-CM

## 2020-05-25 DIAGNOSIS — E1159 Type 2 diabetes mellitus with other circulatory complications: Secondary | ICD-10-CM

## 2020-05-25 DIAGNOSIS — G4700 Insomnia, unspecified: Secondary | ICD-10-CM

## 2020-05-25 DIAGNOSIS — E785 Hyperlipidemia, unspecified: Secondary | ICD-10-CM | POA: Diagnosis not present

## 2020-05-25 DIAGNOSIS — Z0000 Encounter for general adult medical examination without abnormal findings: Secondary | ICD-10-CM | POA: Diagnosis not present

## 2020-05-25 DIAGNOSIS — I10 Essential (primary) hypertension: Secondary | ICD-10-CM | POA: Diagnosis not present

## 2020-05-25 DIAGNOSIS — Z125 Encounter for screening for malignant neoplasm of prostate: Secondary | ICD-10-CM | POA: Diagnosis not present

## 2020-05-25 DIAGNOSIS — I152 Hypertension secondary to endocrine disorders: Secondary | ICD-10-CM

## 2020-05-25 LAB — UA/M W/RFLX CULTURE, ROUTINE
Bilirubin, UA: NEGATIVE
Ketones, UA: NEGATIVE
Leukocytes,UA: NEGATIVE
Nitrite, UA: NEGATIVE
Protein,UA: NEGATIVE
RBC, UA: NEGATIVE
Specific Gravity, UA: 1.015 (ref 1.005–1.030)
Urobilinogen, Ur: 0.2 mg/dL (ref 0.2–1.0)
pH, UA: 5.5 (ref 5.0–7.5)

## 2020-05-25 MED ORDER — TRESIBA FLEXTOUCH 100 UNIT/ML ~~LOC~~ SOPN
15.0000 [IU] | PEN_INJECTOR | Freq: Every day | SUBCUTANEOUS | 5 refills | Status: DC
Start: 2020-05-25 — End: 2020-11-26

## 2020-05-25 MED ORDER — ZOLPIDEM TARTRATE 10 MG PO TABS: 10.0000 mg | ORAL_TABLET | Freq: Every evening | ORAL | 2 refills | Status: DC | PRN

## 2020-05-25 NOTE — Assessment & Plan Note (Signed)
BPs stable and under good control, continue present medications, DASH diet

## 2020-05-25 NOTE — Progress Notes (Signed)
BP 119/78 (BP Location: Left Arm, Patient Position: Sitting, Cuff Size: Normal)   Pulse 72   Temp 98.9 F (37.2 C) (Oral)   Ht _0  (1.778 m)   Wt 201 lb 3.2 oz (91.3 kg)   SpO2 97%   BMI 28.87 kg/m    Subjective:    Patient ID: Mark Allen, male    DOB: 16-Oct-1950, 69 y.o.   MRN: [id number]  HPI: Mark Allen is a 69 y.o. male presenting on 05/25/2020 for comprehensive medical examination. Current medical complaints include:see below  HTN - On benazepril and amlodipine, taking faithfully without side effects. Denies CP, SOB, HAs, dizziness. Eating low sodium and staying very active.   HLD - on crestor, tolerating well without side effects. Denies CP, SOB, myalgias, claudication.   DM - On metformin, jardiance16 units tresiba, blood sugars averaging 130s. No low blood sugar spells.   ED - still not able to get his medication affordably and notes it only works sometimes when he uses it. Still looking into better options for affordability  Insomnia - On ambien and trazodone regimen which has worked well for quite some time for him. Denies concerns, side effects, morning grogginess.   He currently lives with: Interim Problems from his last visit: no  Depression Screen done today and results listed below:  Depression screen [facility] 2/9 02/09/2020 09/15/2018 03/15/2018 11/10/2017 03/23/2017  Decreased Interest 0 0 0 0 0  Down, Depressed, Hopeless 0 0 0 0 0  PHQ - 2 Score 0 0 0 0 0  Altered sleeping - 0 - - -  Tired, decreased energy - 3 - - -  Change in appetite - 0 - - -  Feeling bad or failure about yourself  - 0 - - -  Trouble concentrating - 0 - - -  Moving slowly or fidgety/restless - 0 - - -  Suicidal thoughts - 0 - - -  PHQ-9 Score - 3 - - -    The patient does not have a history of falls. I did complete a risk assessment for falls. A plan of care for falls was documented.   Past Medical History:  Past Medical History:  Diagnosis Date  . Broken leg 06/2018   Left  leg  . Calculus of kidney   . Hyperlipidemia   . Hypertension   . Impotence, organic   . Insomnia   . Testicular hypofunction   . Type II diabetes mellitus with neurological manifestations [facility])     Surgical History:  Past Surgical History:  Procedure Laterality Date  . COLONOSCOPY WITH PROPOFOL N/A 10/22/2018   Procedure: COLONOSCOPY WITH PROPOFOL;  Surgeon: Jonathon Bellows, MD;  Location[facility] ENDOSCOPY;  Service: Gastroenterology;  Laterality: N/A;  . NASAL SINUS SURGERY  2004  . REFRACTIVE SURGERY Bilateral   . SPLENECTOMY  1963  . TONSILLECTOMY      Medications:  Current Outpatient Medications on File Prior to Visit  Medication Sig  . amLODipine (NORVASC) 5 MG tablet TAKE 1 TABLET(5 MG) BY MOUTH DAILY  . benazepril (LOTENSIN) 40 MG tablet TAKE 1 TABLET(40 MG) BY MOUTH DAILY  . Blood Glucose Calibration (TRUE METRIX LEVEL 3) High SOLN   . Blood Glucose Monitoring Suppl (TRUE METRIX METER) w/Device KIT   . DULoxetine (CYMBALTA) 60 MG capsule TAKE 1 CAPSULE BY MOUTH EVERY DAY  . empagliflozin (JARDIANCE) 25 MG TABS tablet Take 25 mg by mouth daily.  Marland Kitchen glucose blood (CONTOUR NEXT TEST) test strip USE AS DIRECTED  TWICE A DAY TO TEST BLOOD SUGAR LEVELS  . Insulin Pen Needle (PEN NEEDLES) 32G X 4 MM MISC 1 Units by Does not apply route 2 (two) times daily as needed.  . metFORMIN (GLUCOPHAGE) 1000 MG tablet Take 1 tablet (1,000 mg total) by mouth 2 (two) times daily with a meal.  . rosuvastatin (CRESTOR) 40 MG tablet TAKE 1 TABLET(40 MG) BY MOUTH DAILY  . traZODone (DESYREL) 50 MG tablet Take 1 tablet (50 mg total) by mouth at bedtime as needed for sleep.  . TRUEplus Lancets 28G MISC   . sildenafil (VIAGRA) 100 MG tablet TAKE 1 TABLET DAILY AS NEEDED FOR ERECTILE DYSFUNCTION (Patient not taking: Reported on 11/24/2019)  . triamcinolone ointment (KENALOG) 0.1 %  (Patient not taking: Reported on 05/25/2020)   No current facility-administered medications on file prior to visit.     Allergies:  Allergies  Allergen Reactions  . Sulfa Antibiotics   . Sulfacetamide Sodium     Social History:  Social History   Socioeconomic History  . Marital status: Married    Spouse name: Not on file  . Number of children: Not on file  . Years of education: Not on file  . Highest education level: Not on file  Occupational History  . Not on file  Tobacco Use  . Smoking status: Former Smoker    Types: Cigarettes    Quit date: 01/27/1989    Years since quitting: 31.3  . Smokeless tobacco: Former Systems developer    Types: [place] date: 09/29/1990  Vaping Use  . Vaping Use: Never used  Substance and Sexual Activity  . Alcohol use: Yes    Alcohol/week: 0.0 standard drinks    Comment: rare,none last 24hrs  . Drug use: No  . Sexual activity: Not on file  Other Topics Concern  . Not on file  Social History Narrative  . Not on file   Social Determinants of Health   Financial Resource Strain:   . Difficulty of Paying Living Expenses: Not on file  Food Insecurity:   . Worried About Charity fundraiser in the Last Year: Not on file  . Ran Out of Food in the Last Year: Not on file  Transportation Needs:   . Lack of Transportation (Medical): Not on file  . Lack of Transportation (Non-Medical): Not on file  Physical Activity:   . Days of Exercise per Week: Not on file  . Minutes of Exercise per Session: Not on file  Stress:   . Feeling of Stress : Not on file  Social Connections:   . Frequency of Communication with Friends and Family: Not on file  . Frequency of Social Gatherings with Friends and Family: Not on file  . Attends Religious Services: Not on file  . Active Member of Clubs or Organizations: Not on file  . Attends Archivist Meetings: Not on file  . Marital Status: Not on file  Intimate Partner Violence:   . Fear of Current or Ex-Partner: Not on file  . Emotionally Abused: Not on file  . Physically Abused: Not on file  . Sexually Abused: Not on file    Social History   Tobacco Use  Smoking Status Former Smoker  . Types: Cigarettes  . Quit date: 01/27/1989  . Years since quitting: 31.3  Smokeless Tobacco Former Systems developer  . Types: Chew  . Quit date: 09/29/1990   Social History   Substance and Sexual Activity  Alcohol Use Yes  .  Alcohol/week: 0.0 standard drinks   Comment: rare,none last 24hrs    Family History:  Family History  Problem Relation Age of Onset  . Cancer Father     Past medical history, surgical history, medications, allergies, family history and social history reviewed with patient today and changes made to appropriate areas of the chart.   Review of Systems - General ROS: negative Psychological ROS: negative Ophthalmic ROS: negative ENT ROS: negative Allergy and Immunology ROS: negative Hematological and Lymphatic ROS: negative Endocrine ROS: negative Breast ROS: negative for breast lumps Respiratory ROS: no cough, shortness of breath, or wheezing Cardiovascular ROS: no chest pain or dyspnea on exertion Gastrointestinal ROS: no abdominal pain, change in bowel habits, or black or bloody stools Genito-Urinary ROS: no dysuria, trouble voiding, or hematuria Musculoskeletal ROS: negative Neurological ROS: no TIA or stroke symptoms Dermatological ROS: negative All other ROS negative except what is listed above and in the HPI.      Objective:    BP 119/78 (BP Location: Left Arm, Patient Position: Sitting, Cuff Size: Normal)   Pulse 72   Temp 98.9 F (37.2 C) (Oral)   Ht _0  (1.778 m)   Wt 201 lb 3.2 oz (91.3 kg)   SpO2 97%   BMI 28.87 kg/m   Wt Readings from Last 3 Encounters:  05/25/20 201 lb 3.2 oz (91.3 kg)  02/09/20 197 lb 4 oz (89.5 kg)  11/24/19 197 lb (89.4 kg)    Physical Exam Vitals and nursing note reviewed.  Constitutional:      General: He is not in acute distress.    Appearance: He is well-developed.  HENT:     Head: Atraumatic.     Right Ear: Tympanic membrane and external ear  normal.     Left Ear: Tympanic membrane and external ear normal.     Nose: Nose normal.     Mouth/Throat:     Mouth: Mucous membranes are moist.     Pharynx: Oropharynx is clear.  Eyes:     General: No scleral icterus.    Conjunctiva/sclera: Conjunctivae normal.     Pupils: Pupils are equal, round, and reactive to light.  Cardiovascular:     Rate and Rhythm: Normal rate and regular rhythm.     Heart sounds: Normal heart sounds. No murmur heard.   Pulmonary:     Effort: Pulmonary effort is normal. No respiratory distress.     Breath sounds: Normal breath sounds.  Abdominal:     General: Bowel sounds are normal. There is no distension.     Palpations: Abdomen is soft. There is no mass.     Tenderness: There is no abdominal tenderness. There is no guarding.  Genitourinary:    Comments: GU exam declined Musculoskeletal:        General: No tenderness. Normal range of motion.     Cervical back: Normal range of motion and neck supple.  Skin:    General: Skin is warm and dry.     Findings: No rash.  Neurological:     General: No focal deficit present.     Mental Status: He is alert and oriented to person, place, and time.     Deep Tendon Reflexes: Reflexes are normal and symmetric.  Psychiatric:        Mood and Affect: Mood normal.        Behavior: Behavior normal.        Thought Content: Thought content normal.        Judgment: Judgment  normal.     Results for orders placed or performed in visit on 11/24/19  CBC with Differential/Platelet  Result Value Ref Range   WBC 6.5 3.4 - 10.8 x10E3/uL   RBC 4.94 4.14 - 5.80 x10E6/uL   Hemoglobin 15.4 13.0 - 17.7 g/dL   Hematocrit 45.0 37.5 - 51.0 %   MCV 91 79 - 97 fL   MCH 31.2 26.6 - 33.0 pg   MCHC 34.2 31 - 35 g/dL   RDW 13.1 11.6 - 15.4 %   Platelets 337 150 - 450 x10E3/uL   Neutrophils 62 Not Estab. %   Lymphs 24 Not Estab. %   Monocytes 11 Not Estab. %   Eos 2 Not Estab. %   Basos 1 Not Estab. %   Neutrophils Absolute  4.1 1 - 7 x10E3/uL   Lymphocytes Absolute 1.5 0 - 3 x10E3/uL   Monocytes Absolute 0.7 0 - 0 x10E3/uL   EOS (ABSOLUTE) 0.1 0.0 - 0.4 x10E3/uL   Basophils Absolute 0.0 0 - 0 x10E3/uL   Immature Granulocytes 0 Not Estab. %   Immature Grans (Abs) 0.0 0.0 - 0.1 x10E3/uL  Comprehensive metabolic panel  Result Value Ref Range   Glucose 151 (H) 65 - 99 mg/dL   BUN 19 8 - 27 mg/dL   Creatinine, Ser 0.95 0.76 - 1.27 mg/dL   GFR calc non Af Amer 83 >59 mL/min/1.73   GFR calc Af Amer 96 >59 mL/min/1.73   BUN/Creatinine Ratio 20 10 - 24   Sodium 138 134 - 144 mmol/L   Potassium 4.7 3.5 - 5.2 mmol/L   Chloride 103 96 - 106 mmol/L   CO2 21 20 - 29 mmol/L   Calcium 9.7 8.6 - 10.2 mg/dL   Total Protein 6.9 6.0 - 8.5 g/dL   Albumin 4.5 3.8 - 4.8 g/dL   Globulin, Total 2.4 1.5 - 4.5 g/dL   Albumin/Globulin Ratio 1.9 1.2 - 2.2   Bilirubin Total 0.3 0.0 - 1.2 mg/dL   Alkaline Phosphatase 65 39 - 117 IU/L   AST 16 0 - 40 IU/L   ALT 21 0 - 44 IU/L  Lipid Panel w/o Chol/HDL Ratio  Result Value Ref Range   Cholesterol, Total 159 100 - 199 mg/dL   Triglycerides 94 0 - 149 mg/dL   HDL 60 >39 mg/dL   VLDL Cholesterol Cal 17 5 - 40 mg/dL   LDL Chol Calc (NIH) 82 0 - 99 mg/dL  HgB A1c  Result Value Ref Range   Hgb A1c MFr Bld 6.5 (H) 4.8 - 5.6 %   Est. average glucose Bld gHb Est-mCnc 140 mg/dL      Assessment & Plan:   Problem List Items Addressed This Visit      Cardiovascular and Mediastinum   Hypertension associated with diabetes ([place]) - Primary    BPs stable and under good control, continue present medications, DASH diet      Relevant Medications   insulin degludec (TRESIBA FLEXTOUCH) 100 UNIT/ML FlexTouch Pen   Other Relevant Orders   CBC with Differential/Platelet   Comprehensive metabolic panel   UA/M w/rflx Culture, Routine     Endocrine   Type II diabetes mellitus with neurological manifestations ([facility])    Recheck A1C, adjust as needed. Continue present regimen and lifestyle  modifications      Relevant Medications   insulin degludec (TRESIBA FLEXTOUCH) 100 UNIT/ML FlexTouch Pen   Other Relevant Orders   HgB A1c     Other   Hyperlipidemia  Recheck lipids, adjust as needed. Continue crestor and lifestyle modifications      Relevant Orders   Lipid Panel w/o Chol/HDL Ratio   Insomnia    Stable and well controlled, continue present regimen      ED (erectile dysfunction)    Discussed alternative options for better affordability, continue present regimen prn       Other Visit Diagnoses    Annual physical exam       Screening for prostate cancer       Relevant Orders   PSA   Abrasion of left upper extremity, initial encounter       Relevant Orders   Td : Tetanus/diphtheria >7yo Preservative  free (Completed)   Need for Streptococcus pneumoniae vaccination       Relevant Orders   Pneumococcal polysaccharide vaccine 23-valent greater than or equal to 2yo subcutaneous/IM   Need for Td vaccine       Relevant Orders   Td : Tetanus/diphtheria >7yo Preservative  free (Completed)       Discussed aspirin prophylaxis for myocardial infarction prevention and decision was made to continue ASA  LABORATORY TESTING:  Health maintenance labs ordered today as discussed above.   The natural history of prostate cancer and ongoing controversy regarding screening and potential treatment outcomes of prostate cancer has been discussed with the patient. The meaning of a false positive PSA and a false negative PSA has been discussed. He indicates understanding of the limitations of this screening test and wishes to proceed with screening PSA testing.   IMMUNIZATIONS:   - Tdap: Tetanus vaccination status reviewed: Td vaccination indicated and given today. - Influenza: Postponed to flu season - Pneumovax: out of stock at the moment - Prevnar: Up to date - HPV: Not applicable - Zostavax vaccine: Up to date  SCREENING: - Colonoscopy: Up to date  Discussed with  patient purpose of the colonoscopy is to detect colon cancer at curable precancerous or early stages   PATIENT COUNSELING:    Sexuality: Discussed sexually transmitted diseases, partner selection, use of condoms, avoidance of unintended pregnancy  and contraceptive alternatives.   Advised to avoid cigarette smoking.  I discussed with the patient that most people either abstain from alcohol or drink within safe limits (<=14/week and <=4 drinks/occasion for males, <=7/weeks and <= 3 drinks/occasion for females) and that the risk for alcohol disorders and other health effects rises proportionally with the number of drinks per week and how often a drinker exceeds daily limits.  Discussed cessation/primary prevention of drug use and availability of treatment for abuse.   Diet: Encouraged to adjust caloric intake to maintain  or achieve ideal body weight, to reduce intake of dietary saturated fat and total fat, to limit sodium intake by avoiding high sodium foods and not adding table salt, and to maintain adequate dietary potassium and calcium preferably from fresh fruits, vegetables, and low-fat dairy products.    stressed the importance of regular exercise  Injury prevention: Discussed safety belts, safety helmets, smoke detector, smoking near bedding or upholstery.   Dental health: Discussed importance of regular tooth brushing, flossing, and dental visits.   Follow up plan: NEXT PREVENTATIVE PHYSICAL DUE IN 1 YEAR. Return in about 6 months (around 11/25/2020) for 6 month f/u.

## 2020-05-25 NOTE — Assessment & Plan Note (Signed)
Discussed alternative options for better affordability, continue present regimen prn

## 2020-05-25 NOTE — Patient Instructions (Signed)
[facility]

## 2020-05-25 NOTE — Assessment & Plan Note (Signed)
Recheck lipids, adjust as needed. Continue crestor and lifestyle modifications

## 2020-05-25 NOTE — Assessment & Plan Note (Signed)
Stable and well controlled, continue present regimen

## 2020-05-25 NOTE — Assessment & Plan Note (Signed)
Recheck A1C, adjust as needed. Continue present regimen and lifestyle modifications

## 2020-05-26 LAB — COMPREHENSIVE METABOLIC PANEL
ALT: 21 IU/L (ref 0–44)
AST: 14 IU/L (ref 0–40)
Albumin/Globulin Ratio: 1.7 (ref 1.2–2.2)
Albumin: 4.7 g/dL (ref 3.8–4.8)
Alkaline Phosphatase: 76 IU/L (ref 48–121)
BUN/Creatinine Ratio: 20 (ref 10–24)
BUN: 16 mg/dL (ref 8–27)
Bilirubin Total: 0.3 mg/dL (ref 0.0–1.2)
CO2: 24 mmol/L (ref 20–29)
Calcium: 9.4 mg/dL (ref 8.6–10.2)
Chloride: 99 mmol/L (ref 96–106)
Creatinine, Ser: 0.8 mg/dL (ref 0.76–1.27)
GFR calc Af Amer: 107 mL/min/{1.73_m2} (ref >59)
GFR calc non Af Amer: 92 mL/min/{1.73_m2} (ref >59)
Globulin, Total: 2.8 g/dL (ref 1.5–4.5)
Glucose: 107 mg/dL — ABNORMAL HIGH (ref 65–99)
Potassium: 4.4 mmol/L (ref 3.5–5.2)
Sodium: 137 mmol/L (ref 134–144)
Total Protein: 7.5 g/dL (ref 6.0–8.5)

## 2020-05-26 LAB — LIPID PANEL W/O CHOL/HDL RATIO
Cholesterol, Total: 148 mg/dL (ref 100–199)
HDL: 57 mg/dL (ref >39)
LDL Chol Calc (NIH): 75 mg/dL (ref 0–99)
Triglycerides: 85 mg/dL (ref 0–149)
VLDL Cholesterol Cal: 16 mg/dL (ref 5–40)

## 2020-05-26 LAB — CBC WITH DIFFERENTIAL/PLATELET
Basophils Absolute: 0.1 10*3/uL (ref 0.0–0.2)
Basos: 1 %
EOS (ABSOLUTE): 0.3 10*3/uL (ref 0.0–0.4)
Eos: 4 %
Hematocrit: 47.5 % (ref 37.5–51.0)
Hemoglobin: 16.1 g/dL (ref 13.0–17.7)
Immature Grans (Abs): 0 10*3/uL (ref 0.0–0.1)
Immature Granulocytes: 0 %
Lymphocytes Absolute: 1.9 10*3/uL (ref 0.7–3.1)
Lymphs: 22 %
MCH: 31.9 pg (ref 26.6–33.0)
MCHC: 33.9 g/dL (ref 31.5–35.7)
MCV: 94 fL (ref 79–97)
Monocytes Absolute: 0.9 10*3/uL (ref 0.1–0.9)
Monocytes: 11 %
Neutrophils Absolute: 5.6 10*3/uL (ref 1.4–7.0)
Neutrophils: 62 %
Platelets: 344 10*3/uL (ref 150–450)
RBC: 5.04 x10E6/uL (ref 4.14–5.80)
RDW: 12.4 % (ref 11.6–15.4)
WBC: 8.9 10*3/uL (ref 3.4–10.8)

## 2020-05-26 LAB — HEMOGLOBIN A1C
Est. average glucose Bld gHb Est-mCnc: 140 mg/dL
Hgb A1c MFr Bld: 6.5 % — ABNORMAL HIGH (ref 4.8–5.6)

## 2020-05-26 LAB — PSA: Prostate Specific Ag, Serum: 0.2 ng/mL (ref 0.0–4.0)

## 2020-05-27 NOTE — Progress Notes (Signed)
Contacted via MyChart  Good evening Mr. Wrightson.  Your labs have returned and overall look fantastic.  A1C remains 6.5%, continue all of your current medication regimen.  If any questions let us know.

## 2020-06-07 ENCOUNTER — Ambulatory Visit: Payer: Medicare HMO

## 2020-06-12 ENCOUNTER — Other Ambulatory Visit: Payer: Self-pay

## 2020-06-12 ENCOUNTER — Ambulatory Visit (INDEPENDENT_AMBULATORY_CARE_PROVIDER_SITE_OTHER): Payer: Medicare HMO

## 2020-06-12 DIAGNOSIS — Z23 Encounter for immunization: Secondary | ICD-10-CM

## 2020-07-04 ENCOUNTER — Other Ambulatory Visit: Payer: Self-pay | Admitting: Family Medicine

## 2020-07-04 DIAGNOSIS — I10 Essential (primary) hypertension: Secondary | ICD-10-CM

## 2020-07-04 DIAGNOSIS — G4700 Insomnia, unspecified: Secondary | ICD-10-CM

## 2020-07-04 DIAGNOSIS — E785 Hyperlipidemia, unspecified: Secondary | ICD-10-CM

## 2020-07-05 NOTE — Telephone Encounter (Signed)
Requested Prescriptions  Pending Prescriptions Disp Refills  . traZODone (DESYREL) 50 MG tablet [Pharmacy Med Name: TRAZODONE HYDROCHLORIDE 50 MG Tablet] 90 tablet 1    Sig: TAKE 1 TABLET (50 MG TOTAL) BY MOUTH AT BEDTIME AS NEEDED FOR SLEEP.     Psychiatry: Antidepressants - Serotonin Modulator Passed - 07/04/2020 11:51 PM      Passed - Valid encounter within last 6 months    Recent Outpatient Visits          1 month ago Hypertension associated with diabetes [facility])   [facility] [name], [place]   4 months ago Type II diabetes mellitus with neurological manifestations [facility])   [facility] [name], [place]   7 months ago Hypertension associated with diabetes [facility])   [facility] [name], [place]   1 year ago Essential hypertension   [place], [place], [place]   1 year ago Essential hypertension   [place], [place], [place]      Future Appointments            In 4 months Johnson, Megan P, DO [place], [facility]           . benazepril (LOTENSIN) 40 MG tablet [Pharmacy Med Name: BENAZEPRIL HCL 40 MG Tablet] 90 tablet 1    Sig: TAKE 1 TABLET EVERY DAY     Cardiovascular:  ACE Inhibitors Passed - 07/04/2020 11:51 PM      Passed - Cr in normal range and within 180 days    Creatinine, Ser  Date Value Ref Range Status  05/25/2020 0.80 0.76 - 1.27 mg/dL Final         Passed - K in normal range and within 180 days    Potassium  Date Value Ref Range Status  05/25/2020 4.4 3.5 - 5.2 mmol/L Final         Passed - Patient is not pregnant      Passed - Last BP in normal range    BP Readings from Last 1 Encounters:  05/25/20 119/78         Passed - Valid encounter within last 6 months    Recent Outpatient Visits          1 month ago Hypertension associated with diabetes [facility])   [facility] [name], [place]   4 months  ago Type II diabetes mellitus with neurological manifestations [facility])   [place], [place], [place]   7 months ago Hypertension associated with diabetes [facility])   [facility] [name], [place]   1 year ago Essential hypertension   [facility] [name], [place]   1 year ago Essential hypertension   [place], [place], [place]      Future Appointments            In 4 months Johnson, Megan P, DO [place], [facility]           . amLODipine (NORVASC) 5 MG tablet [Pharmacy Med Name: AMLODIPINE BESYLATE 5 MG Tablet] 90 tablet 1    Sig: TAKE 1 TABLET EVERY DAY     Cardiovascular:  Calcium Channel Blockers Passed - 07/04/2020 11:51 PM      Passed - Last BP in normal range    BP Readings from Last 1 Encounters:  05/25/20 119/78         Passed - Valid encounter within last  6 months    Recent Outpatient Visits          1 month ago Hypertension associated with diabetes [facility])   [facility] [place], [place]   4 months ago Type II diabetes mellitus with neurological manifestations [facility])   [place], [place], [place]   7 months ago Hypertension associated with diabetes [facility])   [facility] [name], [place]   1 year ago Essential hypertension   [facility] [name], [place]   1 year ago Essential hypertension   [facility] [name], [place]      Future Appointments            In 4 months Wynetta Emery, Barb Merino, DO [employer], [place]           . rosuvastatin (CRESTOR) 40 MG tablet [Pharmacy Med Name: ROSUVASTATIN CALCIUM 40 MG Tablet] 90 tablet 1    Sig: TAKE 1 TABLET EVERY DAY     Cardiovascular:  Antilipid - Statins Failed - 07/04/2020 11:51 PM      Failed - LDL in normal range and within 360 days    LDL Chol Calc (NIH)  Date Value Ref Range Status   05/25/2020 75 0 - 99 mg/dL Final         Passed - Total Cholesterol in normal range and within 360 days    Cholesterol, Total  Date Value Ref Range Status  05/25/2020 148 100 - 199 mg/dL Final   Cholesterol Piccolo, Waived  Date Value Ref Range Status  03/23/2017 145 <200 mg/dL Final    Comment:                            Desirable                <200                         Borderline High      200- 239                         High                     >239          Passed - HDL in normal range and within 360 days    HDL  Date Value Ref Range Status  05/25/2020 57 >39 mg/dL Final         Passed - Triglycerides in normal range and within 360 days    Triglycerides  Date Value Ref Range Status  05/25/2020 85 0 - 149 mg/dL Final   Triglycerides Piccolo,Waived  Date Value Ref Range Status  03/23/2017 74 <150 mg/dL Final    Comment:                            Normal                   <150                         Borderline High     150 - 199  High                200 - 499                         Very High                >499          Passed - Patient is not pregnant      Passed - Valid encounter within last 12 months    Recent Outpatient Visits          1 month ago Hypertension associated with diabetes [facility])   [facility] [place], [place]   4 months ago Type II diabetes mellitus with neurological manifestations [facility])   [facility] [place] [facility], [place]   7 months ago Hypertension associated with diabetes [facility])   [facility] [name], [place]   1 year ago Essential hypertension   [place], [place], [place]   1 year ago Essential hypertension   [place], [place], [place]      Future Appointments            In 4 months Johnson, Megan P, DO [place], [facility]           . metFORMIN (GLUCOPHAGE) 1000 MG  tablet [Pharmacy Med Name: METFORMIN HYDROCHLORIDE 1000 MG Tablet] 180 tablet 1    Sig: TAKE 1 [place]     Endocrinology:  Diabetes - Biguanides Passed - 07/04/2020 11:51 PM      Passed - Cr in normal range and within 360 days    Creatinine, Ser  Date Value Ref Range Status  05/25/2020 0.80 0.76 - 1.27 mg/dL Final         Passed - HBA1C is between 0 and 7.9 and within 180 days    Hemoglobin A1C  Date Value Ref Range Status  05/22/2016 7.6  Final   HB A1C (BAYER DCA - WAIVED)  Date Value Ref Range Status  03/15/2018 6.9 <7.0 % Final    Comment:                                          Diabetic Adult            <7.0                                       Healthy Adult        4.3 - 5.7                                                           (DCCT/NGSP) American Diabetes Association's Summary of Glycemic Recommendations for Adults with Diabetes: Hemoglobin A1c <7.0%. More stringent glycemic goals (A1c <6.0%) may further reduce complications at the cost of increased risk of hypoglycemia.    Hgb A1c MFr Bld  Date Value Ref Range Status  05/25/2020 6.5 (H) 4.8 - 5.6 % Final    Comment:  Prediabetes: 5.7 - 6.4          Diabetes: >6.4          Glycemic control for adults with diabetes: <7.0          Passed - eGFR in normal range and within 360 days    GFR calc Af Amer  Date Value Ref Range Status  05/25/2020 107 >59 mL/min/1.73 Final    Comment:    **[facility] currently reports eGFR in compliance with the current**   recommendations of the [employer]. [facility] will   update reporting as new guidelines are published from the NKF-ASN   Task force.    GFR calc non Af Amer  Date Value Ref Range Status  05/25/2020 92 >59 mL/min/1.73 Final         Passed - Valid encounter within last 6 months    Recent Outpatient Visits          1 month ago Hypertension associated with diabetes [facility])   [facility] [name], [place]   4 months ago Type II diabetes mellitus with neurological manifestations [facility])   [place], [place], [place]   7 months ago Hypertension associated with diabetes [facility])   [facility] [name], [place]   1 year ago Essential hypertension   [facility] [name], [place]   1 year ago Essential hypertension   [facility] [name], [place]      Future Appointments            In 4 months Wynetta Emery, Barb Merino, DO [employer], [facility]

## 2020-08-23 ENCOUNTER — Other Ambulatory Visit: Payer: Self-pay | Admitting: Family Medicine

## 2020-08-23 NOTE — Telephone Encounter (Signed)
   Notes to clinic Historical Provider  

## 2020-08-27 NOTE — Telephone Encounter (Signed)
Patient is scheduled to see you next

## 2020-08-31 ENCOUNTER — Ambulatory Visit (INDEPENDENT_AMBULATORY_CARE_PROVIDER_SITE_OTHER): Payer: Medicare HMO

## 2020-08-31 VITALS — Ht 70.0 in | Wt 195.0 lb

## 2020-08-31 DIAGNOSIS — Z0000 Encounter for general adult medical examination without abnormal findings: Secondary | ICD-10-CM

## 2020-08-31 NOTE — Patient Instructions (Signed)
Mark Allen , Thank you for taking time to come for your Medicare Wellness Visit. I appreciate your ongoing commitment to your health goals. Please review the following plan we discussed and let me know if I can assist you in the future.   Screening recommendations/referrals: Colonoscopy: completed 10/22/2018, due 10/23/2023 Recommended yearly ophthalmology/optometry visit for glaucoma screening and checkup Recommended yearly dental visit for hygiene and checkup  Vaccinations: Influenza vaccine: completed 06/12/2020, due 04/29/2021 Pneumococcal vaccine: completed 06/12/2020 Tdap vaccine: completed 05/25/2020 Shingles vaccine: discussed   Covid-19:  01/02/2020, 12/12/2019  Advanced directives: Please bring a copy of your POA (Power of Attorney) and/or Living Will to your next appointment.   Conditions/risks identified: none  Next appointment: Follow up in one year for your annual wellness visit.   Preventive Care 67 Years and Older, Male Preventive care refers to lifestyle choices and visits with your health care provider that can promote health and wellness. What does preventive care include?  A yearly physical exam. This is also called an annual well check.  Dental exams once or twice a year.  Routine eye exams. Ask your health care provider how often you should have your eyes checked.  Personal lifestyle choices, including:  Daily care of your teeth and gums.  Regular physical activity.  Eating a healthy diet.  Avoiding tobacco and drug use.  Limiting alcohol use.  Practicing safe sex.  Taking low doses of aspirin every day.  Taking vitamin and mineral supplements as recommended by your health care provider. What happens during an annual well check? The services and screenings done by your health care provider during your annual well check will depend on your age, overall health, lifestyle risk factors, and family history of disease. Counseling  Your health care provider  may ask you questions about your:  Alcohol use.  Tobacco use.  Drug use.  Emotional well-being.  Home and relationship well-being.  Sexual activity.  Eating habits.  History of falls.  Memory and ability to understand (cognition).  Work and work Statistician. Screening  You may have the following tests or measurements:  Height, weight, and BMI.  Blood pressure.  Lipid and cholesterol levels. These may be checked every 5 years, or more frequently if you are over 33 years old.  Skin check.  Lung cancer screening. You may have this screening every year starting at age 68 if you have a 30-pack-year history of smoking and currently smoke or have quit within the past 15 years.  Fecal occult blood test (FOBT) of the stool. You may have this test every year starting at age 39.  Flexible sigmoidoscopy or colonoscopy. You may have a sigmoidoscopy every 5 years or a colonoscopy every 10 years starting at age 53.  Prostate cancer screening. Recommendations will vary depending on your family history and other risks.  Hepatitis C blood test.  Hepatitis B blood test.  Sexually transmitted disease (STD) testing.  Diabetes screening. This is done by checking your blood sugar (glucose) after you have not eaten for a while (fasting). You may have this done every 1-3 years.  Abdominal aortic aneurysm (AAA) screening. You may need this if you are a current or former smoker.  Osteoporosis. You may be screened starting at age 77 if you are at high risk. Talk with your health care provider about your test results, treatment options, and if necessary, the need for more tests. Vaccines  Your health care provider may recommend certain vaccines, such as:  Influenza vaccine. This is  recommended every year.  Tetanus, diphtheria, and acellular pertussis (Tdap, Td) vaccine. You may need a Td booster every 10 years.  Zoster vaccine. You may need this after age 55.  Pneumococcal 13-valent  conjugate (PCV13) vaccine. One dose is recommended after age 58.  Pneumococcal polysaccharide (PPSV23) vaccine. One dose is recommended after age 59. Talk to your health care provider about which screenings and vaccines you need and how often you need them. This information is not intended to replace advice given to you by your health care provider. Make sure you discuss any questions you have with your health care provider. Document Released: 10/12/2015 Document Revised: 06/04/2016 Document Reviewed: 07/17/2015 Elsevier Interactive Patient Education  2017 [place] Prevention in the Home Falls can cause injuries. They can happen to people of all ages. There are many things you can do to make your home safe and to help prevent falls. What can I do on the outside of my home?  Regularly fix the edges of walkways and driveways and fix any cracks.  Remove anything that might make you trip as you walk through a door, such as a raised step or threshold.  Trim any bushes or trees on the path to your home.  Use bright outdoor lighting.  Clear any walking paths of anything that might make someone trip, such as rocks or tools.  Regularly check to see if handrails are loose or broken. Make sure that both sides of any steps have handrails.  Any raised decks and porches should have guardrails on the edges.  Have any leaves, snow, or ice cleared regularly.  Use sand or salt on walking paths during winter.  Clean up any spills in your garage right away. This includes oil or grease spills. What can I do in the bathroom?  Use night lights.  Install grab bars by the toilet and in the tub and shower. Do not use towel bars as grab bars.  Use non-skid mats or decals in the tub or shower.  If you need to sit down in the shower, use a plastic, non-slip stool.  Keep the floor dry. Clean up any water that spills on the floor as soon as it happens.  Remove soap buildup in the tub or  shower regularly.  Attach bath mats securely with double-sided non-slip rug tape.  Do not have throw rugs and other things on the floor that can make you trip. What can I do in the bedroom?  Use night lights.  Make sure that you have a light by your bed that is easy to reach.  Do not use any sheets or blankets that are too big for your bed. They should not hang down onto the floor.  Have a firm chair that has side arms. You can use this for support while you get dressed.  Do not have throw rugs and other things on the floor that can make you trip. What can I do in the kitchen?  Clean up any spills right away.  Avoid walking on wet floors.  Keep items that you use a lot in easy-to-reach places.  If you need to reach something above you, use a strong step stool that has a grab bar.  Keep electrical cords out of the way.  Do not use floor polish or wax that makes floors slippery. If you must use wax, use non-skid floor wax.  Do not have throw rugs and other things on the floor that can make you trip.  What can I do with my stairs?  Do not leave any items on the stairs.  Make sure that there are handrails on both sides of the stairs and use them. Fix handrails that are broken or loose. Make sure that handrails are as long as the stairways.  Check any carpeting to make sure that it is firmly attached to the stairs. Fix any carpet that is loose or worn.  Avoid having throw rugs at the top or bottom of the stairs. If you do have throw rugs, attach them to the floor with carpet tape.  Make sure that you have a light switch at the top of the stairs and the bottom of the stairs. If you do not have them, ask someone to add them for you. What else can I do to help prevent falls?  Wear shoes that:  Do not have high heels.  Have rubber bottoms.  Are comfortable and fit you well.  Are closed at the toe. Do not wear sandals.  If you use a stepladder:  Make sure that it is fully  opened. Do not climb a closed stepladder.  Make sure that both sides of the stepladder are locked into place.  Ask someone to hold it for you, if possible.  Clearly mark and make sure that you can see:  Any grab bars or handrails.  First and last steps.  Where the edge of each step is.  Use tools that help you move around (mobility aids) if they are needed. These include:  Canes.  Walkers.  Scooters.  Crutches.  Turn on the lights when you go into a dark area. Replace any light bulbs as soon as they burn out.  Set up your furniture so you have a clear path. Avoid moving your furniture around.  If any of your floors are uneven, fix them.  If there are any pets around you, be aware of where they are.  Review your medicines with your doctor. Some medicines can make you feel dizzy. This can increase your chance of falling. Ask your doctor what other things that you can do to help prevent falls. This information is not intended to replace advice given to you by your health care provider. Make sure you discuss any questions you have with your health care provider. Document Released: 07/12/2009 Document Revised: 02/21/2016 Document Reviewed: 10/20/2014 Elsevier Interactive Patient Education  2017 [employer].

## 2020-08-31 NOTE — Progress Notes (Signed)
I connected with Tia Alert today by telephone and verified that I am speaking with the correct person using two identifiers. Location patient: home Location provider: work Persons participating in the virtual visit: Tia Alert, Glenna Durand LPN.   I discussed the limitations, risks, security and privacy concerns of performing an evaluation and management service by telephone and the availability of in person appointments. I also discussed with the patient that there may be a patient responsible charge related to this service. The patient expressed understanding and verbally consented to this telephonic visit.    Interactive audio and video telecommunications were attempted between this provider and patient, however failed, due to patient having technical difficulties OR patient did not have access to video capability.  We continued and completed visit with audio only.     Vital signs may be patient reported or missing.  Subjective:   Mark Allen is a 69 y.o. male who presents for Medicare Annual/Subsequent preventive examination.  Review of Systems     Cardiac Risk Factors include: advanced age (>90men, >73 women);diabetes mellitus;hypertension;male gender;sedentary lifestyle     Objective:    Today's Vitals   08/31/20 1426  Weight: 195 lb (88.5 kg)  Height: $Remove'5\' 10"'UXvkmDY$  (1.778 m)   Body mass index is 27.98 kg/m.  Advanced Directives 08/31/2020 10/22/2018 07/19/2018 10/16/2016  Does Patient Have a Medical Advance Directive? Yes Yes No No  Type of Paramedic of [place];Living will [place];Living will - -  Copy of [place] in Chart? No - copy requested No - copy requested - -  Would patient like information on creating a medical advance directive? - - No - Patient declined -    Current Medications (verified) Outpatient Encounter Medications as of 08/31/2020  Medication Sig  . amLODipine (NORVASC) 5 MG tablet  TAKE 1 TABLET EVERY DAY  . benazepril (LOTENSIN) 40 MG tablet TAKE 1 TABLET EVERY DAY  . Blood Glucose Calibration (TRUE METRIX LEVEL 3) High SOLN   . Blood Glucose Monitoring Suppl (TRUE METRIX METER) w/Device KIT USE AS DIRECTED  . DROPLET PEN NEEDLES 32G X 4 MM MISC USE 2 (TWO) TIMES DAILY AS NEEDED.  . DULoxetine (CYMBALTA) 60 MG capsule TAKE 1 CAPSULE BY MOUTH EVERY DAY  . glucose blood (CONTOUR NEXT TEST) test strip USE AS DIRECTED TWICE A DAY TO TEST BLOOD SUGAR LEVELS  . insulin degludec (TRESIBA FLEXTOUCH) 100 UNIT/ML FlexTouch Pen Inject 0.15 mLs (15 Units total) into the skin daily.  Marland Kitchen JARDIANCE 25 MG TABS tablet TAKE 1 TABLET EVERY DAY  . metFORMIN (GLUCOPHAGE) 1000 MG tablet TAKE 1 TABLET TWICE DAILY WITH MEALS  . rosuvastatin (CRESTOR) 40 MG tablet TAKE 1 TABLET EVERY DAY  . traZODone (DESYREL) 50 MG tablet TAKE 1 TABLET (50 MG TOTAL) BY MOUTH AT BEDTIME AS NEEDED FOR SLEEP.  Marland Kitchen triamcinolone ointment (KENALOG) 0.1 %   . TRUEplus Lancets 28G MISC   . zolpidem (AMBIEN) 10 MG tablet Take 1 tablet (10 mg total) by mouth at bedtime as needed for sleep.  . sildenafil (VIAGRA) 100 MG tablet TAKE 1 TABLET DAILY AS NEEDED FOR ERECTILE DYSFUNCTION (Patient not taking: Reported on 11/24/2019)   No facility-administered encounter medications on file as of 08/31/2020.    Allergies (verified) Sulfa antibiotics and Sulfacetamide sodium   History: Past Medical History:  Diagnosis Date  . Broken leg 06/2018   Left leg  . Calculus of kidney   . Hyperlipidemia   . Hypertension   .  Impotence, organic   . Insomnia   . Testicular hypofunction   . Type II diabetes mellitus with neurological manifestations [facility])    Past Surgical History:  Procedure Laterality Date  . COLONOSCOPY WITH PROPOFOL N/A 10/22/2018   Procedure: COLONOSCOPY WITH PROPOFOL;  Surgeon: Jonathon Bellows, MD;  Location: [facility] ENDOSCOPY;  Service: Gastroenterology;  Laterality: N/A;  . NASAL SINUS SURGERY  2004  . REFRACTIVE  SURGERY Bilateral   . SPLENECTOMY  1963  . TONSILLECTOMY     Family History  Problem Relation Age of Onset  . Cancer Father    Social History   Socioeconomic History  . Marital status: Married    Spouse name: Not on file  . Number of children: Not on file  . Years of education: Not on file  . Highest education level: Not on file  Occupational History  . Not on file  Tobacco Use  . Smoking status: Former Smoker    Types: Cigarettes    Quit date: 01/27/1989    Years since quitting: 31.6  . Smokeless tobacco: Former Systems developer    Types: [place] date: 09/29/1990  Vaping Use  . Vaping Use: Never used  Substance and Sexual Activity  . Alcohol use: Yes    Alcohol/week: 0.0 standard drinks    Comment: rare,none last 24hrs  . Drug use: No  . Sexual activity: Not on file  Other Topics Concern  . Not on file  Social History Narrative  . Not on file   Social Determinants of Health   Financial Resource Strain: Low Risk   . Difficulty of Paying Living Expenses: Not hard at all  Food Insecurity: No Food Insecurity  . Worried About Charity fundraiser in the Last Year: Never true  . Ran Out of Food in the Last Year: Never true  Transportation Needs: No Transportation Needs  . Lack of Transportation (Medical): No  . Lack of Transportation (Non-Medical): No  Physical Activity: Inactive  . Days of Exercise per Week: 0 days  . Minutes of Exercise per Session: 0 min  Stress: No Stress Concern Present  . Feeling of Stress : Not at all  Social Connections:   . Frequency of Communication with Friends and Family: Not on file  . Frequency of Social Gatherings with Friends and Family: Not on file  . Attends Religious Services: Not on file  . Active Member of Clubs or Organizations: Not on file  . Attends Archivist Meetings: Not on file  . Marital Status: Not on file    Tobacco Counseling Counseling given: Not Answered   Clinical Intake:  Pre-visit preparation  completed: Yes  Pain : No/denies pain     Nutritional Status: BMI 25 -29 Overweight Nutritional Risks: Nausea/ vomitting/ diarrhea (diarrhea, relates to tresiba) Diabetes: Yes  How often do you need to have someone help you when you read instructions, pamphlets, or other written materials from your doctor or pharmacy?: 1 - Never What is the last grade level you completed in school?: college  Diabetic? Yes Nutrition Risk Assessment:  Has the patient had any N/V/D within the last 2 months?  Yes  Does the patient have any non-healing wounds?  No  Has the patient had any unintentional weight loss or weight gain?  No   Diabetes:  Is the patient diabetic?  Yes  If diabetic, was a CBG obtained today?  No  Did the patient bring in their glucometer from home?  No  How  often do you monitor your CBG's? Twice daily.   Financial Strains and Diabetes Management:  Are you having any financial strains with the device, your supplies or your medication? No .  Does the patient want to be seen by Chronic Care Management for management of their diabetes?  No  Would the patient like to be referred to a Nutritionist or for Diabetic Management?  No   Diabetic Exams:  Diabetic Eye Exam: Overdue for diabetic eye exam. Pt has been advised about the importance in completing this exam. Patient advised to call and schedule an eye exam. Diabetic Foot Exam: Overdue, Pt has been advised about the importance in completing this exam. Pt is scheduled for diabetic foot exam on next appointment.   Interpreter Needed?: No  Information entered by :: NAllen LPN   Activities of Daily Living In your present state of health, do you have any difficulty performing the following activities: 08/31/2020 05/25/2020  Hearing? Y Y  Comment needs hearing aide in left ear -  Vision? N N  Difficulty concentrating or making decisions? N N  Walking or climbing stairs? N N  Dressing or bathing? N N  Doing errands, shopping?  N N  Preparing Food and eating ? N -  Using the Toilet? N -  In the past six months, have you accidently leaked urine? N -  Do you have problems with loss of bowel control? N -  Managing your Medications? N -  Managing your Finances? N -  Housekeeping or managing your Housekeeping? N -  Some recent data might be hidden    Patient Care Team: [name], MD as PCP - General (Family Medicine)  Indicate any recent Medical Services you may have received from other than [facility] providers in the past year (date may be approximate).     Assessment:   This is a routine wellness examination for Mark Allen.  Hearing/Vision screen  Hearing Screening   '125Hz'$  $[place]'250Hz'SDBCB$'500Hz'$'1000Hz'$'2000Hz'$'3000Hz'$'4000Hz'$'6000Hz'$'8000Hz'$   Right ear:           Left ear:           Vision Screening Comments: Regular eye exams, [facility]  Dietary issues and exercise activities discussed: Current Exercise Habits: The patient does not participate in regular exercise at present  Goals    . Patient Stated     08/31/2020, wants to start exercising 3 days a week      Depression Screen PHQ 2/9 Scores 08/31/2020 02/09/2020 09/15/2018 03/15/2018 11/10/2017 03/23/2017  PHQ - 2 Score 0 0 0 0 0 0  PHQ- 9 Score - - 3 - - -    Fall Risk Fall Risk  08/31/2020 05/25/2020 02/09/2020 03/15/2018 11/10/2017  Falls in the past year? 0 0 0 No No  Number falls in past yr: - 0 0 - -  Injury with Fall? - 0 0 - -  Risk Factor Category  - - - - -  Risk for fall due to : Medication side effect - - - -  Follow up Falls evaluation completed;Education provided;Falls prevention discussed - - - -    Any stairs in or around the home? Yes  If so, are there any without handrails? No  Home free of loose throw rugs in walkways, pet beds, electrical cords, etc? Yes  Adequate lighting in your home to reduce risk of falls? Yes   ASSISTIVE DEVICES UTILIZED TO PREVENT FALLS:  Life alert? No  Use of a cane,  walker or w/c? No  Grab bars in the  bathroom? Yes  Shower chair or bench in shower? Yes  Elevated toilet seat or a handicapped toilet? No   TIMED UP AND GO:  Was the test performed? No   Cognitive Function:     6CIT Screen 08/31/2020  What Year? 0 points  What month? 0 points  What time? 0 points  Count back from 20 0 points  Months in reverse 0 points  Repeat phrase 0 points  Total Score 0    Immunizations Immunization History  Administered Date(s) Administered  . Fluad Quad(high Dose 65+) 06/12/2020  . Influenza, High Dose Seasonal PF 06/16/2018, 07/01/2019  . Influenza,inj,Quad PF,6+ Mos 07/06/2015, 06/25/2016, 06/23/2017  . Influenza-Unspecified 08/17/2014, 06/22/2019  . PFIZER SARS-COV-2 Vaccination 12/12/2019, 01/02/2020  . Pneumococcal Conjugate-13 03/15/2018  . Pneumococcal Polysaccharide-23 06/12/2020  . Pneumococcal-Unspecified 07/10/2008  . Td 07/17/2009, 05/25/2020  . Zoster 08/10/2012    TDAP status: Up to date Flu Vaccine status: Up to date Pneumococcal vaccine status: Up to date Covid-19 vaccine status: Completed vaccines  Qualifies for Shingles Vaccine? Yes   Zostavax completed Yes   Shingrix Completed?: No.    Education has been provided regarding the importance of this vaccine. Patient has been advised to call insurance company to determine out of pocket expense if they have not yet received this vaccine. Advised may also receive vaccine at local pharmacy or Health Dept. Verbalized acceptance and understanding.  Screening Tests Health Maintenance  Topic Date Due  . FOOT EXAM  03/16/2019  . OPHTHALMOLOGY EXAM  06/30/2020  . HEMOGLOBIN A1C  11/25/2020  . COLONOSCOPY  10/23/2023  . TETANUS/TDAP  05/25/2030  . INFLUENZA VACCINE  Completed  . COVID-19 Vaccine  Completed  . Hepatitis C Screening  Completed  . PNA vac Low Risk Adult  Completed    Health Maintenance  Health Maintenance Due  Topic Date Due  . FOOT EXAM  03/16/2019  . OPHTHALMOLOGY EXAM  06/30/2020    Colorectal  cancer screening: Completed 10/22/2018. Repeat every 5 years  Lung Cancer Screening: (Low Dose CT Chest recommended if Age 21-80 years, 30 pack-year currently smoking OR have quit w/in 15years.) does not qualify.   Lung Cancer Screening Referral: no  Additional Screening:  Hepatitis C Screening: does qualify; Completed 08/21/2015  Vision Screening: Recommended annual ophthalmology exams for early detection of glaucoma and other disorders of the eye. Is the patient up to date with their annual eye exam?  No  Who is the provider or what is the name of the office in which the patient attends annual eye exams? [facility] If pt is not established with a provider, would they like to be referred to a provider to establish care? No .   Dental Screening: Recommended annual dental exams for proper oral hygiene  Community Resource Referral / Chronic Care Management: CRR required this visit?  No   CCM required this visit?  No      Plan:     I have personally reviewed and noted the following in the patient's chart:   . Medical and social history . Use of alcohol, tobacco or illicit drugs  . Current medications and supplements . Functional ability and status . Nutritional status . Physical activity . Advanced directives . List of other physicians . Hospitalizations, surgeries, and ER visits in previous 12 months . Vitals . Screenings to include cognitive, depression, and falls . Referrals and appointments  In addition, I have reviewed and discussed  with patient certain preventive protocols, quality metrics, and best practice recommendations. A written personalized care plan for preventive services as well as general preventive health recommendations were provided to patient.     Kellie Simmering, LPN   [id number]/11/[id number]   Nurse Notes:

## 2020-09-18 DIAGNOSIS — E119 Type 2 diabetes mellitus without complications: Secondary | ICD-10-CM | POA: Diagnosis not present

## 2020-09-18 LAB — HM DIABETES EYE EXAM

## 2020-10-26 ENCOUNTER — Telehealth: Payer: Self-pay | Admitting: *Deleted

## 2020-10-26 NOTE — Chronic Care Management (AMB) (Signed)
  Chronic Care Management   Note  10/26/2020 Name: TEXAS SOUTER MRN: [id number] DOB: 21-Jun-1951  [name] is a 70 y.o. year old male who is a primary care patient of Crissman, Jeannette How, MD. I reached out to [name] by phone today in response to a referral sent by Mr. Jianni Batten Fels's health plan.     Mr. Lozinski was given information about Chronic Care Management services today including:  1. CCM service includes personalized support from designated clinical staff supervised by his physician, including individualized plan of care and coordination with other care providers 2. 24/7 contact phone numbers for assistance for urgent and routine care needs. 3. Service will only be billed when office clinical staff spend 20 minutes or more in a month to coordinate care. 4. Only one practitioner may furnish and bill the service in a calendar month. 5. The patient may stop CCM services at any time (effective at the end of the month) by phone call to the office staff. 6. The patient will be responsible for cost sharing (co-pay) of up to 20% of the service fee (after annual deductible is met).  Patient agreed to services and verbal consent obtained.   Follow up plan: Face to Face appointment with care management team member scheduled for: 11/26/2020  [facility]

## 2020-11-26 ENCOUNTER — Other Ambulatory Visit: Payer: Self-pay

## 2020-11-26 ENCOUNTER — Encounter: Payer: Self-pay | Admitting: Nurse Practitioner

## 2020-11-26 ENCOUNTER — Ambulatory Visit: Payer: Medicare HMO | Admitting: Internal Medicine

## 2020-11-26 ENCOUNTER — Ambulatory Visit (INDEPENDENT_AMBULATORY_CARE_PROVIDER_SITE_OTHER): Payer: Medicare HMO | Admitting: Pharmacist

## 2020-11-26 ENCOUNTER — Ambulatory Visit (INDEPENDENT_AMBULATORY_CARE_PROVIDER_SITE_OTHER): Payer: Medicare HMO | Admitting: Nurse Practitioner

## 2020-11-26 VITALS — BP 109/60 | HR 73 | Temp 98.2°F | Wt 206.0 lb

## 2020-11-26 DIAGNOSIS — I152 Hypertension secondary to endocrine disorders: Secondary | ICD-10-CM

## 2020-11-26 DIAGNOSIS — E1149 Type 2 diabetes mellitus with other diabetic neurological complication: Secondary | ICD-10-CM | POA: Diagnosis not present

## 2020-11-26 DIAGNOSIS — E1159 Type 2 diabetes mellitus with other circulatory complications: Secondary | ICD-10-CM

## 2020-11-26 DIAGNOSIS — I10 Essential (primary) hypertension: Secondary | ICD-10-CM

## 2020-11-26 DIAGNOSIS — G4700 Insomnia, unspecified: Secondary | ICD-10-CM | POA: Diagnosis not present

## 2020-11-26 DIAGNOSIS — E785 Hyperlipidemia, unspecified: Secondary | ICD-10-CM | POA: Diagnosis not present

## 2020-11-26 LAB — BAYER DCA HB A1C WAIVED: HB A1C (BAYER DCA - WAIVED): 6.1 % (ref <7.0)

## 2020-11-26 MED ORDER — BENAZEPRIL HCL 40 MG PO TABS: 40.0000 mg | ORAL_TABLET | Freq: Every day | ORAL | 1 refills | Status: DC

## 2020-11-26 MED ORDER — TRAZODONE HCL 50 MG PO TABS
50.0000 mg | ORAL_TABLET | Freq: Every evening | ORAL | 1 refills | Status: DC | PRN
Start: 2020-11-26 — End: 2021-09-11

## 2020-11-26 MED ORDER — EMPAGLIFLOZIN 25 MG PO TABS: 25.0000 mg | ORAL_TABLET | Freq: Every day | ORAL | 0 refills | Status: DC

## 2020-11-26 MED ORDER — ROSUVASTATIN CALCIUM 40 MG PO TABS: 40.0000 mg | ORAL_TABLET | Freq: Every day | ORAL | 1 refills | Status: DC

## 2020-11-26 MED ORDER — METFORMIN HCL 1000 MG PO TABS: 1000.0000 mg | ORAL_TABLET | Freq: Two times a day (BID) | ORAL | 1 refills | Status: DC

## 2020-11-26 MED ORDER — AMLODIPINE BESYLATE 5 MG PO TABS: 5.0000 mg | ORAL_TABLET | Freq: Every day | ORAL | 1 refills | Status: DC

## 2020-11-26 MED ORDER — DULOXETINE HCL 60 MG PO CPEP: ORAL_CAPSULE | ORAL | 3 refills | Status: DC

## 2020-11-26 NOTE — Assessment & Plan Note (Signed)
Chronic, stable. Continue current regimen. Trazadone refill sent to pharmacy.

## 2020-11-26 NOTE — Assessment & Plan Note (Addendum)
Blood pressure recheck 108/64. Instructed him to monitor blood pressures at home over the next month and we may be able to discontinue his amlodipine. Will follow-up in 1 month. Refills sent to pharmacy. Check CBC and CMP today.

## 2020-11-26 NOTE — Patient Instructions (Addendum)
It was great to see you!  We have checked lab work today and we will let you know the results via mychart. You can stop the tresiba. Keep monitoring your blood sugars twice a day and let us know how they are running.    Let's follow-up in 3 months , sooner if you have concerns.  Take care,  [name], NP

## 2020-11-26 NOTE — Assessment & Plan Note (Signed)
Chronic, stable. Continue crestor. Refills sent to pharmacy. Check lipid panel today.

## 2020-11-26 NOTE — Patient Instructions (Addendum)
Visit Information  It was a pleasure speaking with you today. Thank you for letting me be part of your clinical team. Please call with any questions or concerns.   Goals Addressed            This Visit's Progress   . Monitor and Manage My Blood Sugar-Diabetes Type 2       Timeframe:  Long-Range Goal Priority:  High Start Date:                             Expected End Date:                       Follow Up Date 3 month follow up    - check blood sugar at prescribed times - enter blood sugar readings and medication or insulin into daily log - take the blood sugar meter to all doctor visits    Why is this important?    Checking your blood sugar at home helps to keep it from getting very high or very low.   Writing the results in a diary or log helps the doctor know how to care for you.   Your blood sugar log should have the time, date and the results.   Also, write down the amount of insulin or other medicine that you take.   Other information, like what you ate, exercise done and how you were feeling, will also be helpful.     Notes:     . COMPLETED: Obtain Eye Exam-Diabetes Type 2       Follow Up Date    - keep appointment with eye doctor    Why is this important?    Eye check-ups are important when you have diabetes.   Vision loss can be prevented.    Notes: Eye exam completed 12/21    . Track and Manage My Blood Pressure-Hypertension       Timeframe:  Long-Range Goal Priority:  High Start Date:                             Expected End Date:                       Follow Up Date 3 month follow up    - check blood pressure weekly - write blood pressure results in a log or diary    Why is this important?    You won't feel high blood pressure, but it can still hurt your blood vessels.   High blood pressure can cause heart or kidney problems. It can also cause a stroke.   Making lifestyle changes like losing a little weight or eating less salt will help.    Checking your blood pressure at home and at different times of the day can help to control blood pressure.   If the doctor prescribes medicine remember to take it the way the doctor ordered.   Call the office if you cannot afford the medicine or if there are questions about it.     Notes:        The patient verbalized understanding of instructions, educational materials, and care plan provided today and agreed to receive a mailed copy of patient instructions, educational materials, and care plan.   Telephone follow up appointment with pharmacy team member scheduled for: 3 months  Junita Push. Kenton Kingfisher  PharmD, BCPS Clinical Pharmacist [phone]  Blood Pressure Record Sheet To take your blood pressure, you will need a blood pressure machine. You can buy a blood pressure machine (blood pressure monitor) at your clinic, drug store, or online. When choosing one, consider:  An automatic monitor that has an arm cuff.  A cuff that wraps snugly around your upper arm. You should be able to fit only one finger between your arm and the cuff.  A device that stores blood pressure reading results.  Do not choose a monitor that measures your blood pressure from your wrist or finger. Follow your health care provider's instructions for how to take your blood pressure. To use this form:  Get one reading in the morning (a.m.) before you take any medicines.  Get one reading in the evening (p.m.) before supper.  Take at least 2 readings with each blood pressure check. This makes sure the results are correct. Wait 1-2 minutes between measurements.  Write down the results in the spaces on this form.  Repeat this once a week, or as told by your health care provider.  Make a follow-up appointment with your health care provider to discuss the results. Blood pressure log Date: _______________________  a.m. _____________________(1st reading) _____________________(2nd reading)  p.m.  _____________________(1st reading) _____________________(2nd reading) Date: _______________________  a.m. _____________________(1st reading) _____________________(2nd reading)  p.m. _____________________(1st reading) _____________________(2nd reading) Date: _______________________  a.m. _____________________(1st reading) _____________________(2nd reading)  p.m. _____________________(1st reading) _____________________(2nd reading) Date: _______________________  a.m. _____________________(1st reading) _____________________(2nd reading)  p.m. _____________________(1st reading) _____________________(2nd reading) Date: _______________________  a.m. _____________________(1st reading) _____________________(2nd reading)  p.m. _____________________(1st reading) _____________________(2nd reading) This information is not intended to replace advice given to you by your health care provider. Make sure you discuss any questions you have with your health care provider. Document Revised: 01/04/2020 Document Reviewed: 01/04/2020 Elsevier Patient Education  2021 [employer].

## 2020-11-26 NOTE — Progress Notes (Signed)
Chronic Care Management Pharmacy Note  11/26/2020 Name:  Mark Allen MRN:  [id number] DOB:  10/06/1950  Subjective: Mark Allen is an 70 y.o. year old male who is a primary patient of Vigg, Avanti, MD.  The CCM team was consulted for assistance with disease management and care coordination needs.    Engaged with patient face to face for initial visit in response to provider referral for pharmacy case management and/or care coordination services.   Consent to Services:  The patient was given the following information about Chronic Care Management services today, agreed to services, and gave verbal consent: 1. CCM service includes personalized support from designated clinical staff supervised by the primary care provider, including individualized plan of care and coordination with other care providers 2. 24/7 contact phone numbers for assistance for urgent and routine care needs. 3. Service will only be billed when office clinical staff spend 20 minutes or more in a month to coordinate care. 4. Only one practitioner may furnish and bill the service in a calendar month. 5.The patient may stop CCM services at any time (effective at the end of the month) by phone call to the office staff. 6. The patient will be responsible for cost sharing (co-pay) of up to 20% of the service fee (after annual deductible is met). Patient agreed to services and consent obtained.  Patient Care Team: Charlynne Cousins, MD as PCP - General Vladimir Faster, [facility] (Pharmacist) Vladimir Faster, [facility] (Pharmacist)  Recent office visits: Scheduled today 05/25/20- Merrie Roof, PAC- blood work 06/19/21- Pneumococcal and flu vaccines  Recent consult visits: 09/18/20- Dr Annamaria Helling, eye exam  Hospital visits: None in previous 6 months  Objective:  Lab Results  Component Value Date   CREATININE 0.80 05/25/2020   BUN 16 05/25/2020   GFRNONAA 92 05/25/2020   GFRAA 107 05/25/2020   NA 137 05/25/2020   K 4.4 05/25/2020    CALCIUM 9.4 05/25/2020   CO2 24 05/25/2020    Lab Results  Component Value Date/Time   HGBA1C 6.5 (H) 05/25/2020 02:34 PM   HGBA1C 6.5 (H) 11/24/2019 10:25 AM   HGBA1C 6.9 03/15/2018 02:04 PM   HGBA1C 6.8 11/10/2017 03:32 PM   HGBA1C 7.6 05/22/2016 12:00 AM   MICROALBUR 30 (H) 05/22/2016 03:28 PM   MICROALBUR 10 05/29/2015 02:38 PM    Last diabetic Eye exam:  Lab Results  Component Value Date/Time   HMDIABEYEEXA No Retinopathy 09/18/2020 12:00 AM    Last diabetic Foot exam: No results found for: HMDIABFOOTEX   Lab Results  Component Value Date   CHOL 148 05/25/2020   HDL 57 05/25/2020   LDLCALC 75 05/25/2020   TRIG 85 05/25/2020   CHOLHDL 2.6 11/10/2017    Hepatic Function Latest Ref Rng & Units 05/25/2020 11/24/2019 05/23/2019  Total Protein 6.0 - 8.5 g/dL 7.5 6.9 7.3  Albumin 3.8 - 4.8 g/dL 4.7 4.5 4.5  AST 0 - 40 IU/L $Remov'14 16 16  'AUGfIj$ ALT 0 - 44 IU/L $Remov'21 21 21  'nbfflO$ Alk Phosphatase 48 - 121 IU/L 76 65 76  Total Bilirubin 0.0 - 1.2 mg/dL 0.3 0.3 0.5    Lab Results  Component Value Date/Time   TSH 2.100 11/10/2017 03:41 PM   TSH 1.650 09/16/2016 02:03 PM    CBC Latest Ref Rng & Units 05/25/2020 11/24/2019 09/15/2018  WBC 3.4 - 10.8 x10E3/uL 8.9 6.5 7.7  Hemoglobin 13.0 - 17.7 g/dL 16.1 15.4 15.5  Hematocrit 37.5 - 51.0 % 47.5 45.0 44.7  Platelets 150 - 450 x10E3/uL 344 337 355    No results found for: VD25OH  Clinical ASCVD: No  The 10-year ASCVD risk score Denman George DC Jr., et al., 2013) is: 18.7%   Values used to calculate the score:     Age: 48 years     Sex: Male     Is Non-Hispanic African American: No     Diabetic: Yes     Tobacco smoker: No     Systolic Blood Pressure: 109 mmHg     Is BP treated: Yes     HDL Cholesterol: 57 mg/dL     Total Cholesterol: 148 mg/dL    Depression screen [facility] 2/9 08/31/2020 02/09/2020 09/15/2018  Decreased Interest 0 0 0  Down, Depressed, Hopeless 0 0 0  PHQ - 2 Score 0 0 0  Altered sleeping - - 0  Tired, decreased energy - - 3   Change in appetite - - 0  Feeling bad or failure about yourself  - - 0  Trouble concentrating - - 0  Moving slowly or fidgety/restless - - 0  Suicidal thoughts - - 0  PHQ-9 Score - - 3       Social History   Tobacco Use  Smoking Status Former Smoker  . Types: Cigarettes  . Quit date: 01/27/1989  . Years since quitting: 31.8  Smokeless Tobacco Former Neurosurgeon  . Types: Chew  . Quit date: 09/29/1990   BP Readings from Last 3 Encounters:  11/26/20 109/60  05/25/20 119/78  02/09/20 136/80   Pulse Readings from Last 3 Encounters:  11/26/20 73  05/25/20 72  02/09/20 67   Wt Readings from Last 3 Encounters:  11/26/20 206 lb (93.4 kg)  08/31/20 195 lb (88.5 kg)  05/25/20 201 lb 3.2 oz (91.3 kg)    Assessment/Interventions: Review of patient past medical history, allergies, medications, health status, including review of consultants reports, laboratory and other test data, was performed as part of comprehensive evaluation and provision of chronic care management services.   SDOH:  (Social Determinants of Health) assessments and interventions performed: Yes     Immunization History  Administered Date(s) Administered  . Fluad Quad(high Dose 65+) 06/12/2020  . Influenza, High Dose Seasonal PF 06/16/2018, 07/01/2019  . Influenza,inj,Quad PF,6+ Mos 07/06/2015, 06/25/2016, 06/23/2017  . Influenza-Unspecified 08/17/2014, 06/22/2019  . PFIZER(Purple Top)SARS-COV-2 Vaccination 12/12/2019, 01/02/2020  . Pneumococcal Conjugate-13 03/15/2018  . Pneumococcal Polysaccharide-23 06/12/2020  . Pneumococcal-Unspecified 07/10/2008  . Td 07/17/2009, 05/25/2020  . Zoster 08/10/2012    Conditions to be addressed/monitored:  Hypertension, Hyperlipidemia, Diabetes and Insomnia and ED  Care Plan : CCM Pharmacy Care Plan  Updates made by Lajean Manes, RPH since 11/26/2020 12:00 AM    Problem: Diabetes, HTN, HLD, neuropathy, insomnia   Priority: High    Long-Range Goal: Disease Management    Start Date: 11/26/2020  This Visit's Progress: On track  Priority: High  Note:   Current Barriers:  . Unable to independently monitor therapeutic efficacy . Unable to self administer medications as prescribed - wishes to change from daily insulin to weekly GLP-1 . Does not maintain contact with provider office   Pharmacist Clinical Goal(s):  Marland Kitchen Over the next 90 days, patient will verbalize ability to afford treatment regimen . achieve adherence to monitoring guidelines and medication adherence to achieve therapeutic efficacy . maintain control of diabetes as evidenced by hbA1c  through collaboration with PharmD and provider.   Interventions: . 1:1 collaboration with Loura Pardon, MD regarding development and update  of comprehensive plan of care as evidenced by provider attestation and co-signature . Inter-disciplinary care team collaboration (see longitudinal plan of care) . Comprehensive medication review performed; medication list updated in electronic medical record  BP Readings from Last 3 Encounters:  05/25/20 119/78  02/09/20 136/80  11/24/19 111/73    Hypertension (BP goal <130/80) -Controlled -Current treatment: . Amlodipine 5 mg qd . 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, does not a structured exercise plan -Denies hypotensive/hypertensive symptoms -Educated on BP goals and benefits of medications for prevention of heart attack, stroke and kidney damage; Exercise goal of 150 minutes per week; Importance of home blood pressure monitoring; Symptoms of hypotension and importance of maintaining adequate hydration; -Counseled to monitor BP at home 2-3 times weekly, document, and provide log at future appointments -Counseled  on diet and exercise extensively Recommended to continue current medication Lab Results  Component Value Date   [place] 75 05/25/2020    Hyperlipidemia: (LDL goal < 70) -Uncontrolled -Current treatment: . Rosuvastatin 40 mg qd -Medications previously tried: NA  --Educated on Cholesterol goals;  Benefits of statin for ASCVD risk reduction; Importance of limiting foods high in cholesterol; Exercise goal of 150 minutes per week; Strategies to manage statin-induced myalgias; -Counseled on diet and exercise extensively Recommended to continue current medication Educated on data suggesting coQ10 supplementation may provide benefit for statin associated muscle pain   Diabetes (A1c goal <7%) -Controlled -Current medications: . Tresiba 16 units qd . Metformin $RemoveBef'1000mg'VoeSvvHaPp$  bid . Jardiance 25 mg qd -Medications previously tried: Trulicity--cost, farxiga-unknown  -Current home glucose readings . fasting glucose: 130-145 . post prandial glucose: 170s, 200 if he eats sweets -Denies hypoglycemic/hyperglycemic symptoms -Current meal patterns:  . snacks: sugar-free ice cream ,cookies . 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 Trulicity is a more costly drug and will likley get him into the coverage gap sooner. He previously tolerated and stopped due to cost. Would like to resume and stop daily Tresiba -Counseled to check feet daily and get yearly eye exams -Counseled on diet and exercise extensively Recommended patient discuss GLP-1 with prescriber.   Collaborated with Lauren  Assessed patient finances. He may be over income for  patient assistance based on his estimate. He is most concerned about the coverage gap.  Urine albumin/creatinine ratio: last drawn in 2017 No B12 or Vitamin d level available in chart Previously taking duloxetine 60 mg qd for peripheral neuropathy--has not taken in > 2 months. Denies and withdrawal symptoms. Does not recall taking gabapentin. Could consider gabapentin for neuropathic pain.   Insomnia (Goal: Improve qol minimize symptoms, prevent adverse events -Controlled -Current treatment  . Trazodone 50 mg qd . Zolpidem 10 mg qd prn --uses very rarely if trazodone ineffective -Medications previously tried: NA  Patient reports he falls asleep easily but wakes at 2am if he does not take trazodone. He rarely uses zolpidem.    Patient Goals/Self-Care Activities . Over the next 90 days, patient will:  - take medications as prescribed focus on  medication adherence by utilizing pill box check glucose twice daily, document, and provide at future appointments check blood pressure 2-3 times weekly, document, and provide at future appointments collaborate with provider on medication access solutions  Follow Up Plan: Telephone follow up appointment with care management team member scheduled for: 3 months          Medication Assistance: Patient wishes to switch back to GLP-1 and is concerned about the coverage gap. May pursue PAP at later date.   Patient's preferred pharmacy is:  [facility] [address] [place] - [place] [address] Phone: [phone] Fax: [place], [place] [address] [address] Phone: [phone] Fax: [id number] [address], [place] [address] Phone: [phone] Fax: [phone]  [facility] [place], [place] [address] Phone: [phone] Fax: [phone]  Uses pill box? Yes Pt endorses 85% compliance  We discussed: Current pharmacy is preferred with insurance plan and patient is satisfied with pharmacy services Patient decided to: Continue current medication management strategy  Care Plan and Follow Up Patient Decision:  Patient agrees to Care Plan and Follow-up.  Plan: Telephone follow up appointment with care management team member scheduled for:  3 months  Junita Push. Kenton Kingfisher PharmD, [facility] [phone]

## 2020-11-26 NOTE — Progress Notes (Signed)
Established Patient Office Visit  Subjective:  Patient ID: Mark Allen, male    DOB: 02/08/1951  Age: 70 y.o. MRN: [id number]  CC:  Chief Complaint  Patient presents with  . Hypertension  . Diabetes  . Erectile Dysfunction  . Insomnia    HPI Mark Allen presents for follow-up with hypertension, diabetes, hyperlipidemia, and insomnia.   HYPERTENSION / HYPERLIPIDEMIA  Satisfied with current treatment? yes Duration of hypertension: chronic BP monitoring frequency: rarely  BP medication side effects: no Past BP meds: amlodipine and benazepril Duration of hyperlipidemia: chronic Cholesterol medication side effects: no Cholesterol supplements: none Past cholesterol medications: rosuvastatin (crestor) Medication compliance: excellent compliance Aspirin: no Recent stressors: no Recurrent headaches: no Visual changes: no Palpitations: no Dyspnea: no Chest pain: no Lower extremity edema: no Dizzy/lightheaded: no   DIABETES  Hypoglycemic episodes:no Polydipsia/polyuria: no Visual disturbance: no Chest pain: no Paresthesias: yes--feet bilaterally Glucose Monitoring: yes  Accucheck frequency: Daily Fasting: 130-145 Afternoon: 180 Taking Insulin?: yes  Long acting insulin: Tresiba 15 units daily Retinal Examination: Up to Date Foot Exam: Not up to Date Diabetic Education: Completed Pneumovax: Up to Date Influenza: Up to Date Aspirin: no   INSOMNIA  Doing well with trazadone nightly. Uses ambien as a "back-up" and doesn't take that frequently.   Past Medical History:  Diagnosis Date  . Broken leg 06/2018   Left leg  . Calculus of kidney   . Hyperlipidemia   . Hypertension   . Impotence, organic   . Insomnia   . Testicular hypofunction   . Type II diabetes mellitus with neurological manifestations [facility])     Past Surgical History:  Procedure Laterality Date  . COLONOSCOPY WITH PROPOFOL N/A 10/22/2018   Procedure: COLONOSCOPY WITH PROPOFOL;  Surgeon:  Jonathon Bellows, MD;  [facility]: [facility] ENDOSCOPY;  Service: Gastroenterology;  Laterality: N/A;  . NASAL SINUS SURGERY  2004  . REFRACTIVE SURGERY Bilateral   . SPLENECTOMY  1963  . TONSILLECTOMY      Family History  Problem Relation Age of Onset  . Cancer Father     Social History   Socioeconomic History  . Marital status: Married    Spouse name: Not on file  . Number of children: Not on file  . Years of education: Not on file  . Highest education level: Not on file  Occupational History  . Not on file  Tobacco Use  . Smoking status: Former Smoker    Types: Cigarettes    Quit date: 01/27/1989    Years since quitting: 31.8  . Smokeless tobacco: Former Systems developer    Types: [place] date: 09/29/1990  Vaping Use  . Vaping Use: Never used  Substance and Sexual Activity  . Alcohol use: Yes    Alcohol/week: 0.0 standard drinks    Comment: rare,none last 24hrs  . Drug use: No  . Sexual activity: Not on file  Other Topics Concern  . Not on file  Social History Narrative  . Not on file   Social Determinants of Health   Financial Resource Strain: Low Risk   . Difficulty of Paying Living Expenses: Not hard at all  Food Insecurity: No Food Insecurity  . Worried About Charity fundraiser in the Last Year: Never true  . Ran Out of Food in the Last Year: Never true  Transportation Needs: No Transportation Needs  . Lack of Transportation (Medical): No  . Lack of Transportation (Non-Medical): No  Physical Activity: Inactive  . Days  of Exercise per Week: 0 days  . Minutes of Exercise per Session: 0 min  Stress: No Stress Concern Present  . Feeling of Stress : Not at all  Social Connections: Not on file  Intimate Partner Violence: Not on file    Outpatient Medications Prior to Visit  Medication Sig Dispense Refill  . Blood Glucose Calibration (TRUE METRIX LEVEL 3) High SOLN     . Blood Glucose Monitoring Suppl (TRUE METRIX METER) w/Device KIT USE AS DIRECTED 1 kit 1  . DROPLET PEN  NEEDLES 32G X 4 MM MISC USE 2 (TWO) TIMES DAILY AS NEEDED. 200 each 2  . glucose blood (CONTOUR NEXT TEST) test strip USE AS DIRECTED TWICE A DAY TO TEST BLOOD SUGAR LEVELS 100 each 11  . sildenafil (VIAGRA) 100 MG tablet TAKE 1 TABLET DAILY AS NEEDED FOR ERECTILE DYSFUNCTION 10 tablet 12  . triamcinolone ointment (KENALOG) 0.1 %     . TRUEplus Lancets 28G MISC     . zolpidem (AMBIEN) 10 MG tablet Take 1 tablet (10 mg total) by mouth at bedtime as needed for sleep. 30 tablet 2  . amLODipine (NORVASC) 5 MG tablet TAKE 1 TABLET EVERY DAY 90 tablet 1  . benazepril (LOTENSIN) 40 MG tablet TAKE 1 TABLET EVERY DAY 90 tablet 1  . DULoxetine (CYMBALTA) 60 MG capsule TAKE 1 CAPSULE BY MOUTH EVERY DAY 90 capsule 3  . insulin degludec (TRESIBA FLEXTOUCH) 100 UNIT/ML FlexTouch Pen Inject 0.15 mLs (15 Units total) into the skin daily. 15 mL 5  . JARDIANCE 25 MG TABS tablet TAKE 1 TABLET EVERY DAY 90 tablet 0  . metFORMIN (GLUCOPHAGE) 1000 MG tablet TAKE 1 TABLET TWICE DAILY WITH MEALS 180 tablet 1  . rosuvastatin (CRESTOR) 40 MG tablet TAKE 1 TABLET EVERY DAY 90 tablet 1  . traZODone (DESYREL) 50 MG tablet TAKE 1 TABLET (50 MG TOTAL) BY MOUTH AT BEDTIME AS NEEDED FOR SLEEP. 90 tablet 1   No facility-administered medications prior to visit.    Allergies  Allergen Reactions  . Sulfa Antibiotics   . Sulfacetamide Sodium     ROS Review of Systems  Constitutional: Positive for fatigue.  HENT: Negative.   Eyes: Negative.   Respiratory: Negative.   Cardiovascular: Negative.   Gastrointestinal: Negative.   Genitourinary: Positive for frequency.  Neurological: Negative.   Hematological: Bruises/bleeds easily.  Psychiatric/Behavioral: Negative.       Objective:    Physical Exam Vitals and nursing note reviewed.  Constitutional:      Appearance: Normal appearance.  HENT:     Head: Normocephalic.  Eyes:     Conjunctiva/sclera: Conjunctivae normal.  Cardiovascular:     Rate and Rhythm: Normal  rate and regular rhythm.     Pulses: Normal pulses.     Heart sounds: Normal heart sounds.  Pulmonary:     Effort: Pulmonary effort is normal.     Breath sounds: Normal breath sounds.  Musculoskeletal:     Cervical back: Normal range of motion.     Right lower leg: No edema.     Left lower leg: No edema.  Skin:    General: Skin is warm and dry.  Neurological:     General: No focal deficit present.     Mental Status: He is alert and oriented to person, place, and time.  Psychiatric:        Mood and Affect: Mood normal.        Behavior: Behavior normal.  Thought Content: Thought content normal.        Judgment: Judgment normal.    Diabetic Foot Exam - Simple   Simple Foot Form Diabetic Foot exam was performed with the following findings: Yes 11/26/2020  3:42 PM  Visual Inspection No deformities, no ulcerations, no other skin breakdown bilaterally: Yes Sensation Testing Intact to touch and monofilament testing bilaterally: Yes Pulse Check Posterior Tibialis and Dorsalis pulse intact bilaterally: Yes Comments      BP 109/60   Pulse 73   Temp 98.2 F (36.8 C)   Wt 206 lb (93.4 kg)   SpO2 96%   BMI 29.56 kg/m  Wt Readings from Last 3 Encounters:  11/26/20 206 lb (93.4 kg)  08/31/20 195 lb (88.5 kg)  05/25/20 201 lb 3.2 oz (91.3 kg)     Health Maintenance Due  Topic Date Due  . HEMOGLOBIN A1C  11/25/2020     Lab Results  Component Value Date   TSH 2.100 11/10/2017   Lab Results  Component Value Date   WBC 8.9 05/25/2020   HGB 16.1 05/25/2020   HCT 47.5 05/25/2020   MCV 94 05/25/2020   PLT 344 05/25/2020   Lab Results  Component Value Date   NA 137 05/25/2020   K 4.4 05/25/2020   CO2 24 05/25/2020   GLUCOSE 107 (H) 05/25/2020   BUN 16 05/25/2020   CREATININE 0.80 05/25/2020   BILITOT 0.3 05/25/2020   ALKPHOS 76 05/25/2020   AST 14 05/25/2020   ALT 21 05/25/2020   PROT 7.5 05/25/2020   ALBUMIN 4.7 05/25/2020   CALCIUM 9.4 05/25/2020    Lab Results  Component Value Date   CHOL 148 05/25/2020   Lab Results  Component Value Date   HDL 57 05/25/2020   Lab Results  Component Value Date   LDLCALC 75 05/25/2020   Lab Results  Component Value Date   TRIG 85 05/25/2020   Lab Results  Component Value Date   CHOLHDL 2.6 11/10/2017   Lab Results  Component Value Date   HGBA1C 6.5 (H) 05/25/2020      Assessment & Plan:   Problem List Items Addressed This Visit      Cardiovascular and Mediastinum   Hypertension associated with diabetes ([place]) - Primary    Blood pressure recheck 108/64. Instructed him to monitor blood pressures at home over the next month and we may be able to discontinue his amlodipine. Will follow-up in 1 month. Refills sent to pharmacy. Check CBC and CMP today.      Relevant Medications   amLODipine (NORVASC) 5 MG tablet   benazepril (LOTENSIN) 40 MG tablet   empagliflozin (JARDIANCE) 25 MG TABS tablet   metFORMIN (GLUCOPHAGE) 1000 MG tablet   rosuvastatin (CRESTOR) 40 MG tablet   Other Relevant Orders   Comp Met (CMET)   CBC w/Diff     Endocrine   Type II diabetes mellitus with neurological manifestations (HCC)    A1c 6.1% today. Will stop tresiba and monitor blood sugars twice a day for the next month. Continue to watch diet. Refills sent to pharmacy for jardiance, metformin and cymbalta. Will check vitamin b12 level today with fatigue and taking metformin. Follow-up in 1 month.       Relevant Medications   benazepril (LOTENSIN) 40 MG tablet   empagliflozin (JARDIANCE) 25 MG TABS tablet   metFORMIN (GLUCOPHAGE) 1000 MG tablet   rosuvastatin (CRESTOR) 40 MG tablet   Other Relevant Orders   Comp Met (CMET)  CBC w/Diff   Bayer DCA Hb A1c Waived   Vitamin B12     Other   Hyperlipidemia    Chronic, stable. Continue crestor. Refills sent to pharmacy. Check lipid panel today.       Relevant Medications   amLODipine (NORVASC) 5 MG tablet   benazepril (LOTENSIN) 40 MG tablet    rosuvastatin (CRESTOR) 40 MG tablet   Other Relevant Orders   Comp Met (CMET)   CBC w/Diff   Lipid Panel w/o Chol/HDL Ratio   Insomnia    Chronic, stable. Continue current regimen. Trazadone refill sent to pharmacy.       Relevant Medications   traZODone (DESYREL) 50 MG tablet    Other Visit Diagnoses    Essential hypertension       Relevant Medications   amLODipine (NORVASC) 5 MG tablet   benazepril (LOTENSIN) 40 MG tablet   rosuvastatin (CRESTOR) 40 MG tablet      Meds ordered this encounter  Medications  . amLODipine (NORVASC) 5 MG tablet    Sig: Take 1 tablet (5 mg total) by mouth daily.    Dispense:  90 tablet    Refill:  1  . benazepril (LOTENSIN) 40 MG tablet    Sig: Take 1 tablet (40 mg total) by mouth daily.    Dispense:  90 tablet    Refill:  1  . DULoxetine (CYMBALTA) 60 MG capsule    Sig: TAKE 1 CAPSULE BY MOUTH EVERY DAY    Dispense:  90 capsule    Refill:  3  . empagliflozin (JARDIANCE) 25 MG TABS tablet    Sig: Take 1 tablet (25 mg total) by mouth daily.    Dispense:  90 tablet    Refill:  0  . metFORMIN (GLUCOPHAGE) 1000 MG tablet    Sig: Take 1 tablet (1,000 mg total) by mouth 2 (two) times daily with a meal.    Dispense:  180 tablet    Refill:  1  . rosuvastatin (CRESTOR) 40 MG tablet    Sig: Take 1 tablet (40 mg total) by mouth daily.    Dispense:  90 tablet    Refill:  1  . traZODone (DESYREL) 50 MG tablet    Sig: Take 1 tablet (50 mg total) by mouth at bedtime as needed for sleep.    Dispense:  90 tablet    Refill:  1    Follow-up: Return in about 3 months (around 02/23/2021) for diabetes.    Charyl Dancer, NP

## 2020-11-26 NOTE — Assessment & Plan Note (Addendum)
A1c 6.1% today. Will stop tresiba and monitor blood sugars twice a day for the next month. Continue to watch diet. Refills sent to pharmacy for jardiance, metformin and cymbalta. Will check vitamin b12 level today with fatigue and taking metformin. Follow-up in 1 month.

## 2020-11-27 LAB — COMPREHENSIVE METABOLIC PANEL
ALT: 17 IU/L (ref 0–44)
AST: 13 IU/L (ref 0–40)
Albumin/Globulin Ratio: 1.8 (ref 1.2–2.2)
Albumin: 4.6 g/dL (ref 3.8–4.8)
Alkaline Phosphatase: 72 IU/L (ref 44–121)
BUN/Creatinine Ratio: 29 — ABNORMAL HIGH (ref 10–24)
BUN: 24 mg/dL (ref 8–27)
Bilirubin Total: 0.3 mg/dL (ref 0.0–1.2)
CO2: 21 mmol/L (ref 20–29)
Calcium: 9.8 mg/dL (ref 8.6–10.2)
Chloride: 101 mmol/L (ref 96–106)
Creatinine, Ser: 0.82 mg/dL (ref 0.76–1.27)
Globulin, Total: 2.6 g/dL (ref 1.5–4.5)
Glucose: 119 mg/dL — ABNORMAL HIGH (ref 65–99)
Potassium: 4.5 mmol/L (ref 3.5–5.2)
Sodium: 138 mmol/L (ref 134–144)
Total Protein: 7.2 g/dL (ref 6.0–8.5)
eGFR: 96 mL/min/{1.73_m2} (ref >59)

## 2020-11-27 LAB — CBC WITH DIFFERENTIAL/PLATELET
Basophils Absolute: 0.1 10*3/uL (ref 0.0–0.2)
Basos: 1 %
EOS (ABSOLUTE): 0.2 10*3/uL (ref 0.0–0.4)
Eos: 2 %
Hematocrit: 46 % (ref 37.5–51.0)
Hemoglobin: 15.5 g/dL (ref 13.0–17.7)
Immature Grans (Abs): 0 10*3/uL (ref 0.0–0.1)
Immature Granulocytes: 0 %
Lymphocytes Absolute: 2.6 10*3/uL (ref 0.7–3.1)
Lymphs: 32 %
MCH: 32 pg (ref 26.6–33.0)
MCHC: 33.7 g/dL (ref 31.5–35.7)
MCV: 95 fL (ref 79–97)
Monocytes Absolute: 0.9 10*3/uL (ref 0.1–0.9)
Monocytes: 11 %
Neutrophils Absolute: 4.3 10*3/uL (ref 1.4–7.0)
Neutrophils: 54 %
Platelets: 345 10*3/uL (ref 150–450)
RBC: 4.84 x10E6/uL (ref 4.14–5.80)
RDW: 12.5 % (ref 11.6–15.4)
WBC: 7.9 10*3/uL (ref 3.4–10.8)

## 2020-11-27 LAB — LIPID PANEL W/O CHOL/HDL RATIO
Cholesterol, Total: 147 mg/dL (ref 100–199)
HDL: 54 mg/dL (ref >39)
LDL Chol Calc (NIH): 68 mg/dL (ref 0–99)
Triglycerides: 144 mg/dL (ref 0–149)
VLDL Cholesterol Cal: 25 mg/dL (ref 5–40)

## 2020-11-27 LAB — VITAMIN B12: Vitamin B-12: 268 pg/mL (ref 232–1245)

## 2020-12-26 MED ORDER — TRULICITY 1.5 MG/0.5ML ~~LOC~~ SOAJ: 1.5000 mg | SUBCUTANEOUS | 1 refills | Status: DC

## 2020-12-26 MED ORDER — TRULICITY 0.75 MG/0.5ML ~~LOC~~ SOAJ: 0.7500 mg | SUBCUTANEOUS | 0 refills | Status: DC

## 2021-01-29 ENCOUNTER — Other Ambulatory Visit: Payer: Self-pay | Admitting: Nurse Practitioner

## 2021-01-29 MED ORDER — INSULIN DEGLUDEC 100 UNIT/ML ~~LOC~~ SOPN: 15.0000 [IU] | PEN_INJECTOR | Freq: Every day | SUBCUTANEOUS | 1 refills | Status: DC

## 2021-02-01 MED ORDER — SILDENAFIL CITRATE 100 MG PO TABS: ORAL_TABLET | ORAL | 11 refills | Status: DC

## 2021-02-06 ENCOUNTER — Other Ambulatory Visit: Payer: Self-pay | Admitting: Nurse Practitioner

## 2021-02-17 NOTE — Progress Notes (Signed)
Chronic Care Management Pharmacy Note  02/18/2021 Name:  Mark Allen MRN:  [id number] DOB:  1951/01/04  Subjective: Mark Allen is an 70 y.o. year old male who is a primary patient of Vigg, Avanti, MD.  The CCM team was consulted for assistance with disease management and care coordination needs.    Engaged with patient face to face for initial visit in response to provider referral for pharmacy case management and/or care coordination services.   Consent to Services:  The patient was given information about Chronic Care Management services, agreed to services, and gave verbal consent prior to initiation of services.  Please see initial visit note for detailed documentation.   Patient Care Team: Charlynne Cousins, MD as PCP - General Vladimir Faster, [facility] (Pharmacist) Vladimir Faster, [facility] (Pharmacist)  Recent office visits: 7/8/29Dwan Allen- Trulicity in coverage gap  3/30/22Dwan Allen- patient call, BG elevated Trulicity sent to pharmacy 11/26/20-Mark Allen- stop Tyler Aas start Jardiance, blood work 05/25/20- Mark Allen, PAC- blood work 06/19/21- Pneumococcal and flu vaccines  Recent consult visits: 09/18/20- Dr Mark Allen, eye exam  Hospital visits: None in previous 6 months  Objective:  Lab Results  Component Value Date   CREATININE 0.82 11/26/2020   BUN 24 11/26/2020   GFRNONAA 92 05/25/2020   GFRAA 107 05/25/2020   NA 138 11/26/2020   K 4.5 11/26/2020   CALCIUM 9.8 11/26/2020   CO2 21 11/26/2020    Lab Results  Component Value Date/Time   HGBA1C 6.1 11/26/2020 03:03 PM   HGBA1C 6.5 (H) 05/25/2020 02:34 PM   HGBA1C 6.5 (H) 11/24/2019 10:25 AM   HGBA1C 6.9 03/15/2018 02:04 PM   HGBA1C 7.6 05/22/2016 12:00 AM   MICROALBUR 30 (H) 05/22/2016 03:28 PM   MICROALBUR 10 05/29/2015 02:38 PM    Last diabetic Eye exam:  Lab Results  Component Value Date/Time   HMDIABEYEEXA No Retinopathy 09/18/2020 12:00 AM    Last diabetic Foot exam: No results found for: HMDIABFOOTEX    Lab Results  Component Value Date   CHOL 147 11/26/2020   HDL 54 11/26/2020   LDLCALC 68 11/26/2020   TRIG 144 11/26/2020   CHOLHDL 2.6 11/10/2017    Hepatic Function Latest Ref Rng & Units 11/26/2020 05/25/2020 11/24/2019  Total Protein 6.0 - 8.5 g/dL 7.2 7.5 6.9  Albumin 3.8 - 4.8 g/dL 4.6 4.7 4.5  AST 0 - 40 IU/L $Remov'13 14 16  'QAijeM$ ALT 0 - 44 IU/L $Remov'17 21 21  'whFWJB$ Alk Phosphatase 44 - 121 IU/L 72 76 65  Total Bilirubin 0.0 - 1.2 mg/dL 0.3 0.3 0.3    Lab Results  Component Value Date/Time   TSH 2.100 11/10/2017 03:41 PM   TSH 1.650 09/16/2016 02:03 PM    CBC Latest Ref Rng & Units 11/26/2020 05/25/2020 11/24/2019  WBC 3.4 - 10.8 x10E3/uL 7.9 8.9 6.5  Hemoglobin 13.0 - 17.7 g/dL 15.5 16.1 15.4  Hematocrit 37.5 - 51.0 % 46.0 47.5 45.0  Platelets 150 - 450 x10E3/uL 345 344 337    No results found for: VD25OH  Clinical ASCVD: No  The 10-year ASCVD risk score [name], et al., 2013) is: 20.7%   Values used to calculate the score:     Age: 79 years     Sex: Male     Is Non-Hispanic African American: No     Diabetic: Yes     Tobacco smoker: No     Systolic Blood Pressure: 562 mmHg     Is BP treated: Yes  HDL Cholesterol: 54 mg/dL     Total Cholesterol: 147 mg/dL    Depression screen [facility] 2/9 08/31/2020 02/09/2020 09/15/2018  Decreased Interest 0 0 0  Down, Depressed, Hopeless 0 0 0  PHQ - 2 Score 0 0 0  Altered sleeping - - 0  Tired, decreased energy - - 3  Change in appetite - - 0  Feeling bad or failure about yourself  - - 0  Trouble concentrating - - 0  Moving slowly or fidgety/restless - - 0  Suicidal thoughts - - 0  PHQ-9 Score - - 3       Social History   Tobacco Use  Smoking Status Former Smoker  . Types: Cigarettes  . Quit date: 01/27/1989  . Years since quitting: 32.0  Smokeless Tobacco Former Systems developer  . Types: Chew  . Quit date: 09/29/1990   BP Readings from Last 3 Encounters:  11/26/20 109/60  05/25/20 119/78  02/09/20 136/80   Pulse Readings from Last 3  Encounters:  11/26/20 73  05/25/20 72  02/09/20 67   Wt Readings from Last 3 Encounters:  11/26/20 206 lb (93.4 kg)  08/31/20 195 lb (88.5 kg)  05/25/20 201 lb 3.2 oz (91.3 kg)    Assessment/Interventions: Review of patient past medical history, allergies, medications, health status, including review of consultants reports, laboratory and other test data, was performed as part of comprehensive evaluation and provision of chronic care management services.   SDOH:  (Social Determinants of Health) assessments and interventions performed: Yes     Immunization History  Administered Date(s) Administered  . Fluad Quad(high Dose 65+) 06/12/2020  . Influenza, High Dose Seasonal PF 06/16/2018, 07/01/2019  . Influenza,inj,Quad PF,6+ Mos 07/06/2015, 06/25/2016, 06/23/2017  . Influenza-Unspecified 08/17/2014, 06/22/2019  . PFIZER(Purple Top)SARS-COV-2 Vaccination 12/12/2019, 01/02/2020  . Pneumococcal Conjugate-13 03/15/2018  . Pneumococcal Polysaccharide-23 06/12/2020  . Pneumococcal-Unspecified 07/10/2008  . Td 07/17/2009, 05/25/2020  . Zoster 08/10/2012    Conditions to be addressed/monitored:  Hypertension, Hyperlipidemia, Diabetes and Insomnia and ED  Care Plan : [place]  Updates made by Vladimir Faster, [place] since 02/18/2021 12:00 AM    Problem: Diabetes, HTN, HLD, neuropathy, insomnia   Priority: High    Long-Range Goal: Disease Management   Start Date: 11/26/2020  Recent Progress: On track  Priority: High  Note:   Current Barriers:  . Unable to independently monitor therapeutic efficacy . Unable to self administer medications as prescribed - wishes to change from daily insulin to weekly GLP-1 . Unable to independently afford treatment regimen . Does not maintain contact with provider office   Pharmacist Clinical Goal(s):  Marland Kitchen Over the next 90 days, patient will verbalize ability to afford treatment regimen . achieve adherence to monitoring guidelines and  medication adherence to achieve therapeutic efficacy . maintain control of diabetes as evidenced by hbA1c  through collaboration with PharmD and provider.   Interventions: . 1:1 collaboration with Charlynne Cousins, MD regarding development and update of comprehensive plan of care as evidenced by provider attestation and co-signature . Inter-disciplinary care team collaboration (see longitudinal plan of care) . Comprehensive medication review performed; medication list updated in electronic medical record  Current Barriers:  . Unable to independently monitor therapeutic efficacy . Unable to self administer medications as prescribed - wishes to change from daily insulin to weekly GLP-1 . Does not maintain contact with provider office   Pharmacist Clinical Goal(s):  Marland Kitchen Over the next 90 days, patient will verbalize ability to afford treatment regimen .  achieve adherence to monitoring guidelines and medication adherence to achieve therapeutic efficacy . maintain control of diabetes as evidenced by hbA1c  through collaboration with PharmD and provider.   Interventions: . 1:1 collaboration with Charlynne Cousins, MD regarding development and update of comprehensive plan of care as evidenced by provider attestation and co-signature . Inter-disciplinary care team collaboration (see longitudinal plan of care) . Comprehensive medication review performed; medication list updated in electronic medical record BP Readings from Last 3 Encounters:  11/26/20 109/60  05/25/20 119/78  02/09/20 136/80    Hypertension (BP goal <130/80) -Controlled -Current treatment: . Amlodipine 5 mg qd . 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, does not a  structured exercise plan -Denies hypotensive/hypertensive symptoms -Educated on BP goals and benefits of medications for prevention of heart attack, stroke and kidney damage; Exercise goal of 150 minutes per week; Importance of home blood pressure monitoring; Symptoms of hypotension and importance of maintaining adequate hydration; -Counseled to monitor BP at home 2-3 times weekly, document, and provide log at future appointments -Counseled on diet and exercise extensively Recommended to continue current medication   Hyperlipidemia: (LDL goal < 70) -Uncontrolled -Current treatment: . Rosuvastatin 40 mg qd -Medications previously tried: NA  --Educated on Cholesterol goals;  Benefits of statin for ASCVD risk reduction; Importance of limiting foods high in cholesterol; Exercise goal of 150 minutes per week; Strategies to manage statin-induced myalgias; -Counseled on diet and exercise extensively Recommended to continue current medication Educated on data suggesting coQ10 supplementation may provide benefit for statin associated muscle pain   Diabetes (A1c goal <7%) -Controlled -Current medications: . Tresiba 15 units qd . Metformin $RemoveBef'1000mg'jQjkiMBguy$  bid . Jardiance 25 mg qd -Medications previously tried: Trulicity--cost, farxiga-unknown  -Current home glucose readings . fasting glucose: 97 yesterday, 122 today,; usually 128-130 . post prandial glucose: 170s, 200 rarely if he eats sweets -Denies hypoglycemic/hyperglycemic symptoms -Current meal patterns:  . snacks: sugar-free ice cream ,cookies . 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 Trulicity is a more costly drug and will likley get him into the coverage gap sooner. He previously tolerated and stopped due to cost. Would like to resume and stop daily Tresiba -Counseled to check feet daily and get yearly eye exams -Counseled on diet and exercise extensively Recommended patient discuss GLP-1 with prescriber.   Collaborated with [name], NP Assessed patient finances. He may be over income for patient assistance based on his estimate. He is most concerned about the coverage gap.  Urine albumin/creatinine ratio: last drawn in 2017 No B12 or Vitamin d level available in chart Previously taking duloxetine 60 mg qd for peripheral neuropathy--has not taken in > 2 months. Denies and withdrawal symptoms. Does not recall taking gabapentin. Could consider gabapentin for neuropathic pain. 02/18/21: Update: Patient is in coverage gap, Trulicity copay increased to $239.27/month. Spoke with [name], [name], who states a tier exception can be processed with verbal consent from patient. Patient will verify with his wife their monthly income to determine  PAP eligibility. Recommend 500 mcg B12 qd as last level was low normal and patient has neuropathy. Addenddum: Spoek with patient and informed him to call [facility] and request a Tier exception. Patient confirmed his household income exceeds the limit for NovoNordisk,  Insomnia (Goal: Improve qol minimize symptoms, prevent adverse events -Controlled -Current treatment  . Trazodone 50 mg qd . Zolpidem 10 mg qd prn --uses very rarely if trazodone ineffective -Medications previously tried: NA  Patient reports he falls asleep easily but wakes at 2am if he does not take trazodone. He rarely uses zolpidem.    Patient Goals/Self-Care Activities . Over the next 90 days, patient will:  - take medications as prescribed focus on medication adherence by utilizing pill box check glucose twice daily, document, and provide at  future appointments check blood pressure 2-3 times weekly, document, and provide at future appointments collaborate with provider on medication access solutions  Follow Up Plan: Telephone follow up appointment with care management team member scheduled for: 3 months           Medication Assistance: Patient wishes to switch back to GLP-1 and is concerned about the coverage gap. May pursue PAP at later date.   Patient's preferred pharmacy is:  [facility] [address], [place] - [place] [address] Phone: [phone] Fax: [place], [place] [address] [address] Phone: [phone] Fax: [place] [id number], [place] [address] Phone: [phone] Fax: [phone]  [facility] [place], [place] [address] Phone: [phone] Fax: [place], [place] [address] Phone: [phone] Fax: [phone]  Uses pill box? Yes Pt endorses 85% compliance  We discussed: Current pharmacy is preferred with insurance plan and patient is satisfied with pharmacy services Patient decided to: Continue current medication management strategy  Care Plan and Follow Up Patient Decision:  Patient agrees to Care Plan and Follow-up.  Plan: Telephone follow up appointment with care management team member scheduled for:  3 months  Junita Push. Kenton Kingfisher PharmD, [facility] [phone]

## 2021-02-18 ENCOUNTER — Ambulatory Visit (INDEPENDENT_AMBULATORY_CARE_PROVIDER_SITE_OTHER): Payer: Medicare HMO | Admitting: Pharmacist

## 2021-02-18 DIAGNOSIS — E1149 Type 2 diabetes mellitus with other diabetic neurological complication: Secondary | ICD-10-CM

## 2021-02-18 DIAGNOSIS — E1159 Type 2 diabetes mellitus with other circulatory complications: Secondary | ICD-10-CM

## 2021-02-18 DIAGNOSIS — I152 Hypertension secondary to endocrine disorders: Secondary | ICD-10-CM

## 2021-02-18 NOTE — Patient Instructions (Addendum)
Visit Information  It was a pleasure speaking with you today. Thank you for letting me be part of your clinical team. Please call with any questions or concerns.   Goals Addressed            This Visit's Progress   . Monitor and Manage My Blood Sugar-Diabetes Type 2   On track    Timeframe:  Long-Range Goal Priority:  High Start Date:                             Expected End Date:                       Follow Up Date 3 month follow up    - check blood sugar at prescribed times - enter blood sugar readings and medication or insulin into daily log - take the blood sugar meter to all doctor visits    Why is this important?    Checking your blood sugar at home helps to keep it from getting very high or very low.   Writing the results in a diary or log helps the doctor know how to care for you.   Your blood sugar log should have the time, date and the results.   Also, write down the amount of insulin or other medicine that you take.   Other information, like what you ate, exercise done and how you were feeling, will also be helpful.     Notes:     . Track and Manage My Blood Pressure-Hypertension   On track    Timeframe:  Long-Range Goal Priority:  High Start Date:                             Expected End Date:                       Follow Up Date 3 month follow up    - check blood pressure weekly - write blood pressure results in a log or diary    Why is this important?    You won't feel high blood pressure, but it can still hurt your blood vessels.   High blood pressure can cause heart or kidney problems. It can also cause a stroke.   Making lifestyle changes like losing a little weight or eating less salt will help.   Checking your blood pressure at home and at different times of the day can help to control blood pressure.   If the doctor prescribes medicine remember to take it the way the doctor ordered.   Call the office if you cannot afford the medicine or  if there are questions about it.     Notes:        The patient verbalized understanding of instructions, educational materials, and care plan provided today and agreed to receive a mailed copy of patient instructions, educational materials, and care plan.   Telephone follow up appointment with pharmacy team member scheduled for: 3 months   [place] Pod. Shaun Runyon PharmD, BCPS Clinical Pharmacist [phone]  Preventing Diabetes Mellitus Complications You can help to prevent or slow down problems that are caused by diabetes (diabetes mellitus). Following your diabetes plan and taking care of yourself can reduce your risk of serious or life-threatening complications. What actions can I take to prevent diabetes complications? Diabetes management  Follow instructions  from your health care providers about managing your diabetes. Your diabetes may be managed by a team of health care providers who can teach you how to care for yourself and can answer questions that you have.  Educate yourself about your condition so you can make healthy choices about eating and physical activity.  Know your target range for your blood sugar (glucose), and check your blood glucose level as often as told. Your health care provider will help you decide how often to check your blood glucose level depending on your treatment goals and how well you are meeting them.  Ask your health care provider if you should take low-dose aspirin daily and what dose is recommended for you. Taking low-dose aspirin daily is recommended to help prevent cardiovascular disease.   Controlling your blood pressure and cholesterol Your personal target blood pressure is determined based on:  Your age.  Your medicines.  How long you have had diabetes.  Any other medical conditions you have. To control your blood pressure:  Follow instructions from your health care provider about meal planning, exercise, and medicines.  Make sure your  health care provider checks your blood pressure at every medical visit.  Monitor your blood pressure at home as told by your health care provider. To control your cholesterol:  Follow instructions from your health care provider about meal planning, exercise, and medicines.  Have your cholesterol checked at least once a year.  You may be prescribed medicine to lower cholesterol (statin). If you are not taking a statin, ask your health care provider if you should be. Controlling your cholesterol may:  Help prevent heart disease and stroke. These are the most common health problems for people with diabetes.  Improve your blood flow.   Medical appointments and vaccines Schedule and keep yearly physical exams and eye exams. Your health care provider will tell you how often you need medical visits depending on your diabetes management plan. Keep all follow-up visits as told. This is important so possible problems can be identified early and complications can be avoided or treated.  Every visit with your health care provider should include measuring your: ? Weight. ? Blood pressure. ? Blood glucose control.  Your A1C (hemoglobin A1C) level should be checked: ? At least 2 times a year, if you are meeting your treatment goals. ? 4 times a year, if you are not meeting treatment goals or if your treatment goals have changed.  Your blood lipids (lipid profile) should be checked yearly. You should also be checked yearly for protein in your urine (urine microalbumin).  If you have type 1 diabetes, get an eye exam 3-5 years after you are diagnosed, and then once a year after your first exam.  If you have type 2 diabetes, get an eye exam as soon as you are diagnosed, and then once a year after your first exam. It is also important to keep your vaccines current. It is recommended that you receive:  A flu (influenza) vaccine every year.  A pneumonia (pneumococcal) vaccine and a hepatitis B vaccine.  If you are age 3 or older, you may get the pneumonia vaccine as a series of two separate shots. Ask your health care provider which other vaccines may be recommended. Lifestyle  Do not use any products that contain nicotine or tobacco, such as cigarettes, e-cigarettes, and chewing tobacco. If you need help quitting, ask your health care provider. By avoiding nicotine and tobacco: ? You will lower your risk for  heart attack, stroke, nerve disease, and kidney disease. ? Your cholesterol and blood pressure may improve. ? Your blood circulation will improve.  If you drink alcohol: ? Limit how much you use to:  0-1 drink a day for women who are not pregnant.  0-2 drinks a day for men. ? Be aware of how much alcohol is in your drink. In the [place] one drink equals one 12 oz bottle of beer (355 mL), one 5 oz glass of wine (148 mL), or one 11?2 oz glass of hard liquor (44 mL). Taking care of your feet Diabetes may cause you to have poor blood circulation to your legs and feet. Because of this, taking care of your feet is very important. Diabetes can cause:  The skin on the feet to get thinner, break more easily, and heal more slowly.  Nerve damage in your legs and feet, which results in decreased feeling. You may not notice minor injuries that could lead to serious problems. To avoid foot problems:  Check your skin and feet every day for cuts, bruises, redness, blisters, or sores.  Schedule a foot exam with your health care provider once every year. This exam includes: ? Inspecting the structure and skin of your feet. ? Checking the pulses and sensation in your feet.  Make sure that your health care provider performs a visual foot exam at every medical visit.   Taking care of your teeth People with poorly controlled diabetes are more likely to have gum (periodontal) disease. Diabetes can make periodontal diseases harder to control. If not treated, periodontal diseases can lead to tooth loss.  To prevent this:  Brush your teeth twice a day.  Floss at least once a day.  Visit your dentist 2 times a year. Managing stress Living with diabetes can be stressful. When you are experiencing stress, your blood glucose may be affected in two ways:  Stress hormones may cause your blood glucose to rise.  You may be distracted from taking good care of yourself. Be aware of your stress level and make changes to help you manage challenging situations. To lower your stress levels:  Consider joining a support group.  Do planned relaxation or meditation.  Do a hobby that you enjoy.  Maintain healthy relationships.  Exercise regularly.  Work with your health care provider or a mental health professional. Where to find more information  American Diabetes Association: www.diabetes.org  Association of Diabetes Care and Education Specialists: www.diabeteseducator.org Summary  You can take action to prevent or slow down problems that are caused by diabetes (diabetes mellitus). Following your diabetes plan and taking care of yourself can reduce your risk of serious or life-threatening complications.  Follow instructions from your health care providers about managing your diabetes. Your diabetes may be managed by a team of health care providers who can teach you how to care for yourself and can answer questions that you have.  Know your target range for your blood sugar (glucose), and check your blood glucose levels as often as told. Your health care provider will help you decide how often you should check your blood glucose level depending on your treatment goals and how well you are meeting them.  Your health care provider will tell you how often you need medical visits depending on your diabetes management plan. Keep all follow-up visits as directed. This is important so possible problems can be identified early and complications can be avoided or treated. This information is not intended  to replace  advice given to you by your health care provider. Make sure you discuss any questions you have with your health care provider. Document Revised: 11/04/2019 Document Reviewed: 11/04/2019 Elsevier Patient Education  2021 [employer].

## 2021-02-21 ENCOUNTER — Ambulatory Visit: Payer: Medicare HMO | Admitting: Nurse Practitioner

## 2021-02-28 ENCOUNTER — Ambulatory Visit: Payer: Medicare HMO | Admitting: Nurse Practitioner

## 2021-02-28 ENCOUNTER — Other Ambulatory Visit: Payer: Self-pay

## 2021-02-28 ENCOUNTER — Encounter: Payer: Self-pay | Admitting: Nurse Practitioner

## 2021-02-28 VITALS — BP 126/75 | HR 69 | Temp 98.6°F | Ht 69.53 in | Wt 199.6 lb

## 2021-02-28 DIAGNOSIS — E1159 Type 2 diabetes mellitus with other circulatory complications: Secondary | ICD-10-CM | POA: Diagnosis not present

## 2021-02-28 DIAGNOSIS — E1149 Type 2 diabetes mellitus with other diabetic neurological complication: Secondary | ICD-10-CM

## 2021-02-28 DIAGNOSIS — E538 Deficiency of other specified B group vitamins: Secondary | ICD-10-CM | POA: Diagnosis not present

## 2021-02-28 DIAGNOSIS — I152 Hypertension secondary to endocrine disorders: Secondary | ICD-10-CM | POA: Diagnosis not present

## 2021-02-28 LAB — BAYER DCA HB A1C WAIVED: HB A1C (BAYER DCA - WAIVED): 6.4 % (ref <7.0)

## 2021-02-28 MED ORDER — INSULIN DEGLUDEC 100 UNIT/ML ~~LOC~~ SOPN: 16.0000 [IU] | PEN_INJECTOR | Freq: Every day | SUBCUTANEOUS | 1 refills | Status: DC

## 2021-02-28 MED ORDER — EMPAGLIFLOZIN 25 MG PO TABS: 25.0000 mg | ORAL_TABLET | Freq: Every day | ORAL | 0 refills | Status: DC

## 2021-02-28 NOTE — Patient Instructions (Signed)
Vitamin B12 Deficiency Vitamin B12 deficiency occurs when the body does not have enough vitamin B12, which is an important vitamin. The body needs this vitamin:  To make red blood cells.  To make DNA. This is the genetic material inside cells.  To help the nerves work properly so they can carry messages from the brain to the body. Vitamin B12 deficiency can cause various health problems, such as a low red blood cell count (anemia) or nerve damage. What are the causes? This condition may be caused by:  Not eating enough foods that contain vitamin B12.  Not having enough stomach acid and digestive fluids to properly absorb vitamin B12 from the food that you eat.  Certain digestive system diseases that make it hard to absorb vitamin B12. These diseases include Crohn's disease, chronic pancreatitis, and cystic fibrosis.  A condition in which the body does not make enough of a protein (intrinsic factor), resulting in too few red blood cells (pernicious anemia).  Having a surgery in which part of the stomach or small intestine is removed.  Taking certain medicines that make it hard for the body to absorb vitamin B12. These medicines include: ? Heartburn medicines (antacids and proton pump inhibitors). ? Certain antibiotic medicines. ? Some medicines that are used to treat diabetes, tuberculosis, gout, or high cholesterol. What increases the risk? The following factors may make you more likely to develop a B12 deficiency:  Being older than age 50.  Eating a vegetarian or vegan diet, especially while you are pregnant.  Eating a poor diet while you are pregnant.  Taking certain medicines.  Having alcoholism. What are the signs or symptoms? In some cases, there are no symptoms of this condition. If the condition leads to anemia or nerve damage, various symptoms can occur, such as:  Weakness.  Fatigue.  Loss of appetite.  Weight loss.  Numbness or tingling in your hands and  feet.  Redness and burning of the tongue.  Confusion or memory problems.  Depression.  Sensory problems, such as color blindness, ringing in the ears, or loss of taste.  Diarrhea or constipation.  Trouble walking. If anemia is severe, symptoms can include:  Shortness of breath.  Dizziness.  Rapid heart rate (tachycardia). How is this diagnosed? This condition may be diagnosed with a blood test to measure the level of vitamin B12 in your blood. You may also have other tests, including:  A group of tests that measure certain characteristics of blood cells (complete blood count, CBC).  A blood test to measure intrinsic factor.  A procedure where a thin tube with a camera on the end is used to look into your stomach or intestines (endoscopy). Other tests may be needed to discover the cause of B12 deficiency. How is this treated? Treatment for this condition depends on the cause. This condition may be treated by:  Changing your eating and drinking habits, such as: ? Eating more foods that contain vitamin B12. ? Drinking less alcohol or no alcohol.  Getting vitamin B12 injections.  Taking vitamin B12 supplements. Your health care provider will tell you which dosage is best for you. Follow these instructions at home: Eating and drinking  Eat lots of healthy foods that contain vitamin B12, including: ? Meats and poultry. This includes beef, pork, chicken, turkey, and organ meats, such as liver. ? Seafood. This includes clams, rainbow trout, salmon, tuna, and haddock. ? Eggs. ? Cereal and dairy products that are fortified. This means that vitamin B12 has   been added to the food. Check the label on the package to see if the food is fortified. The items listed above may not be a complete list of recommended foods and beverages. Contact a dietitian for more information.   General instructions  Get any injections that are prescribed by your health care provider.  Take  supplements only as told by your health care provider. Follow the directions carefully.  Do not drink alcohol if your health care provider tells you not to. In some cases, you may only be asked to limit alcohol use.  Keep all follow-up visits as told by your health care provider. This is important. Contact a health care provider if:  Your symptoms come back. Get help right away if you:  Develop shortness of breath.  Have a rapid heart rate.  Have chest pain.  Become dizzy or lose consciousness. Summary  Vitamin B12 deficiency occurs when the body does not have enough vitamin B12.  The main causes of vitamin B12 deficiency include dietary deficiency, digestive diseases, pernicious anemia, and having a surgery in which part of the stomach or small intestine is removed.  In some cases, there are no symptoms of this condition. If the condition leads to anemia or nerve damage, various symptoms can occur, such as weakness, shortness of breath, and numbness.  Treatment may include getting vitamin B12 injections or taking vitamin B12 supplements. Eat lots of healthy foods that contain vitamin B12. This information is not intended to replace advice given to you by your health care provider. Make sure you discuss any questions you have with your health care provider. Document Revised: 03/04/2019 Document Reviewed: 05/25/2018 Elsevier Patient Education  2021 Elsevier Inc.  

## 2021-02-28 NOTE — Assessment & Plan Note (Signed)
Chronic, stable. BP 126/75 today. Will continue current regimen. Follow-up in 6 months.

## 2021-02-28 NOTE — Assessment & Plan Note (Signed)
He started taking OTC vitamin B12 supplement daily. Will re-check vitamin B12 today. If levels are not rising, will schedule him for B12 injections.

## 2021-02-28 NOTE — Assessment & Plan Note (Addendum)
Chronic, stable. Currently taking 16 units tresiba daily, jardiance 25mg ,and  metformin 1,000mg  BID. A1C today is 6.4% . Continue current regimen. Refills sent to the pharmacy. Follow-up in 3 months.

## 2021-02-28 NOTE — Progress Notes (Signed)
Established Patient Office Visit  Subjective:  Patient ID: Mark Allen, male    DOB: June 23, 1951  Age: 70 y.o. MRN: [id number]  CC:  Chief Complaint  Patient presents with  . Diabetes  . B-12 check    Started taking B-12 5046m daily.     HPI Mark TAULpresents for follow-up on diabetes, hypertension, and vitamin b12 deficiency.   HYPERTENSION  Hypertension status: controlled  Satisfied with current treatment? yes Duration of hypertension: chronic BP monitoring frequency:  daily BP range: 120s/70s BP medication side effects:  no Medication compliance: excellent compliance Previous BP meds:amlodipine and benazepril Aspirin: no Recurrent headaches: no Visual changes: no Palpitations: no Dyspnea: no Chest pain: no Lower extremity edema: no Dizzy/lightheaded: no   DIABETES  Hypoglycemic episodes:no Polydipsia/polyuria: no Visual disturbance: no Chest pain: no Paresthesias: no Glucose Monitoring: yes  Accucheck frequency: BID  Fasting glucose: 90s-140s  Post prandial:  Evening:  Before meals: Taking Insulin?: yes  Long acting insulin: 16 units daily  Short acting insulin: Retinal Examination: Up to Date Foot Exam: Up to Date Diabetic Education: Completed Pneumovax: Up to Date Influenza: Up to Date Aspirin: no   Past Medical History:  Diagnosis Date  . Broken leg 06/2018   Left leg  . Calculus of kidney   . Hyperlipidemia   . Hypertension   . Impotence, organic   . Insomnia   . Testicular hypofunction   . Type II diabetes mellitus with neurological manifestations ([facility]     Past Surgical History:  Procedure Laterality Date  . COLONOSCOPY WITH PROPOFOL N/A 10/22/2018   Procedure: COLONOSCOPY WITH PROPOFOL;  Surgeon: AJonathon Bellows MD;  [facility]  Service: Gastroenterology;  Laterality: N/A;  . NASAL SINUS SURGERY  2004  . REFRACTIVE SURGERY Bilateral   . SPLENECTOMY  1963  . TONSILLECTOMY      Family History  Problem  Relation Age of Onset  . Cancer Father     Social History   Socioeconomic History  . Marital status: Married    Spouse name: Not on file  . Number of children: Not on file  . Years of education: Not on file  . Highest education level: Not on file  Occupational History  . Not on file  Tobacco Use  . Smoking status: Former Smoker    Types: Cigarettes    Quit date: 01/27/1989    Years since quitting: 32.1  . Smokeless tobacco: Former USystems developer   [place]1990  Vaping Use  . Vaping Use: Never used  Substance and Sexual Activity  . Alcohol use: Yes    Alcohol/week: 0.0 standard drinks    Comment: rare,none last 24hrs  . Drug use: No  . Sexual activity: Not on file  Other Topics Concern  . Not on file  Social History Narrative  . Not on file   Social Determinants of Health   Financial Resource Strain: Low Risk   . Difficulty of Paying Living Expenses: Not hard at all  Food Insecurity: No Food Insecurity  . Worried About RCharity fundraiserin the Last Year: Never true  . Ran Out of Food in the Last Year: Never true  Transportation Needs: No Transportation Needs  . Lack of Transportation (Medical): No  . Lack of Transportation (Non-Medical): No  Physical Activity: Inactive  . Days of Exercise per Week: 0 days  . Minutes of Exercise per Session: 0 min  Stress: No Stress Concern Present  .  Feeling of Stress : Not at all  Social Connections: Not on file  Intimate Partner Violence: Not on file    Outpatient Medications Prior to Visit  Medication Sig Dispense Refill  . amLODipine (NORVASC) 5 MG tablet Take 1 tablet (5 mg total) by mouth daily. 90 tablet 1  . benazepril (LOTENSIN) 40 MG tablet Take 1 tablet (40 mg total) by mouth daily. 90 tablet 1  . Blood Glucose Calibration (TRUE METRIX LEVEL 3) High SOLN     . Blood Glucose Monitoring Suppl (TRUE METRIX METER) w/Device KIT USE AS DIRECTED 1 kit 1  . cyanocobalamin 1000 MCG tablet Take 1,000 mcg by  mouth daily.    . DROPLET PEN NEEDLES 32G X 4 MM MISC USE 2 (TWO) TIMES DAILY AS NEEDED. 200 each 2  . DULoxetine (CYMBALTA) 60 MG capsule TAKE 1 CAPSULE BY MOUTH EVERY DAY 90 capsule 3  . glucose blood (CONTOUR NEXT TEST) test strip USE AS DIRECTED TWICE A DAY TO TEST BLOOD SUGAR LEVELS 100 each 11  . metFORMIN (GLUCOPHAGE) 1000 MG tablet Take 1 tablet (1,000 mg total) by mouth 2 (two) times daily with a meal. 180 tablet 1  . rosuvastatin (CRESTOR) 40 MG tablet Take 1 tablet (40 mg total) by mouth daily. 90 tablet 1  . sildenafil (VIAGRA) 100 MG tablet TAKE 1 TABLET DAILY AS NEEDED FOR ERECTILE DYSFUNCTION 6 tablet 11  . traZODone (DESYREL) 50 MG tablet Take 1 tablet (50 mg total) by mouth at bedtime as needed for sleep. 90 tablet 1  . triamcinolone ointment (KENALOG) 0.1 %     . TRUEplus Lancets 28G MISC     . zolpidem (AMBIEN) 10 MG tablet Take 1 tablet (10 mg total) by mouth at bedtime as needed for sleep. 30 tablet 2  . empagliflozin (JARDIANCE) 25 MG TABS tablet Take 1 tablet (25 mg total) by mouth daily. 90 tablet 0  . insulin degludec (TRESIBA) 100 UNIT/ML FlexTouch Pen Inject 15 Units into the skin daily. 9 mL 1   No facility-administered medications prior to visit.    Allergies  Allergen Reactions  . Sulfa Antibiotics   . Sulfacetamide Sodium     ROS Review of Systems  Constitutional: Positive for fatigue. Negative for fever.  HENT: Negative.   Eyes: Negative.   Respiratory: Negative.   Cardiovascular: Negative.   Gastrointestinal: Positive for diarrhea (intermittent). Negative for abdominal pain.  Genitourinary: Negative.   Musculoskeletal: Negative.   Skin: Negative.   Neurological: Negative.   Psychiatric/Behavioral: Negative.       Objective:    Physical Exam Vitals and nursing note reviewed.  Constitutional:      Appearance: Normal appearance.  HENT:     Head: Normocephalic.  Eyes:     Conjunctiva/sclera: Conjunctivae normal.  Cardiovascular:     Rate  and Rhythm: Normal rate and regular rhythm.     Pulses: Normal pulses.     Heart sounds: Normal heart sounds.  Pulmonary:     Effort: Pulmonary effort is normal.     Breath sounds: Normal breath sounds.  Abdominal:     Palpations: Abdomen is soft.     Tenderness: There is no abdominal tenderness.  Musculoskeletal:     Cervical back: Normal range of motion.     Right lower leg: No edema.     Left lower leg: No edema.  Skin:    General: Skin is warm and dry.  Neurological:     General: No focal deficit present.  Mental Status: He is alert and oriented to person, place, and time.  Psychiatric:        Mood and Affect: Mood normal.        Behavior: Behavior normal.        Thought Content: Thought content normal.        Judgment: Judgment normal.     BP 126/75   Pulse 69   Temp 98.6 F (37 C) (Oral)   Ht 5' 9.53" (1.766 m)   Wt 199 lb 9.6 oz (90.5 kg)   SpO2 95%   BMI 29.03 kg/m  Wt Readings from Last 3 Encounters:  02/28/21 199 lb 9.6 oz (90.5 kg)  11/26/20 206 lb (93.4 kg)  08/31/20 195 lb (88.5 kg)     There are no preventive care reminders to display for this patient.  There are no preventive care reminders to display for this patient.  Lab Results  Component Value Date   TSH 2.100 11/10/2017   Lab Results  Component Value Date   WBC 7.9 11/26/2020   HGB 15.5 11/26/2020   HCT 46.0 11/26/2020   MCV 95 11/26/2020   PLT 345 11/26/2020   Lab Results  Component Value Date   NA 138 11/26/2020   K 4.5 11/26/2020   CO2 21 11/26/2020   GLUCOSE 119 (H) 11/26/2020   BUN 24 11/26/2020   CREATININE 0.82 11/26/2020   BILITOT 0.3 11/26/2020   ALKPHOS 72 11/26/2020   AST 13 11/26/2020   ALT 17 11/26/2020   PROT 7.2 11/26/2020   ALBUMIN 4.6 11/26/2020   CALCIUM 9.8 11/26/2020   EGFR 96 11/26/2020   Lab Results  Component Value Date   CHOL 147 11/26/2020   Lab Results  Component Value Date   HDL 54 11/26/2020   Lab Results  Component Value Date    LDLCALC 68 11/26/2020   Lab Results  Component Value Date   TRIG 144 11/26/2020   Lab Results  Component Value Date   CHOLHDL 2.6 11/10/2017   Lab Results  Component Value Date   HGBA1C 6.1 11/26/2020      Assessment & Plan:   Problem List Items Addressed This Visit      Cardiovascular and Mediastinum   Hypertension associated with diabetes ([place]) - Primary    Chronic, stable. BP 126/75 today. Will continue current regimen. Follow-up in 6 months.       Relevant Medications   insulin degludec (TRESIBA) 100 UNIT/ML FlexTouch Pen   empagliflozin (JARDIANCE) 25 MG TABS tablet     Endocrine   Type II diabetes mellitus with neurological manifestations ([facility])    Chronic, stable. Currently taking 16 units tresiba daily, jardiance 67m,and  metformin 1,0078mBID. A1C today is 6.4% . Continue current regimen. Refills sent to the pharmacy. Follow-up in 3 months.       Relevant Medications   insulin degludec (TRESIBA) 100 UNIT/ML FlexTouch Pen   empagliflozin (JARDIANCE) 25 MG TABS tablet   Other Relevant Orders   Bayer DCA Hb A1c Waived     Other   Vitamin B 12 deficiency    He started taking OTC vitamin B12 supplement daily. Will re-check vitamin B12 today. If levels are not rising, will schedule him for B12 injections.       Relevant Orders   Vitamin B12      Meds ordered this encounter  Medications  . insulin degludec (TRESIBA) 100 UNIT/ML FlexTouch Pen    Sig: Inject 16 Units into the skin daily.  Dispense:  9 mL    Refill:  1  . empagliflozin (JARDIANCE) 25 MG TABS tablet    Sig: Take 1 tablet (25 mg total) by mouth daily.    Dispense:  90 tablet    Refill:  0    Follow-up: Return in about 3 months (around 05/31/2021) for dm, htn, hld.    Charyl Dancer, NP

## 2021-03-01 LAB — VITAMIN B12: Vitamin B-12: >2000 pg/mL — ABNORMAL HIGH (ref 232–1245)

## 2021-03-13 ENCOUNTER — Telehealth: Payer: Self-pay

## 2021-03-13 NOTE — Telephone Encounter (Signed)
Called [employer] and was advised farxiga is covered but is a tier 4 drug and would have a very high copay. Arletha Pili is also a Tier 3 medication with a high copay. Per [facility] they are only able to mail out a formulary list to the patient.

## 2021-03-14 NOTE — Telephone Encounter (Signed)
Hi Lauren, Isn't Mr. Burcher taking Vania Rea instead of Wilder Glade? He is over the income limit for assistance. We previously requested a tier exception for Trulicity through [facility] but it doesn't look like he restarted it.

## 2021-03-14 NOTE — Telephone Encounter (Signed)
Hopefully, that is still affordable. Let me know if there anything I can do.

## 2021-03-27 ENCOUNTER — Telehealth: Payer: Self-pay | Admitting: Pharmacist

## 2021-03-27 NOTE — Chronic Care Management (AMB) (Signed)
      Chronic Care Management Pharmacy Assistant   Name: Mark Allen  MRN: [id number] DOB: 1951/06/25   Reason for Encounter: Chart Review    Medications: Outpatient Encounter Medications as of 03/27/2021  Medication Sig   amLODipine (NORVASC) 5 MG tablet Take 1 tablet (5 mg total) by mouth daily.   benazepril (LOTENSIN) 40 MG tablet Take 1 tablet (40 mg total) by mouth daily.   Blood Glucose Calibration (TRUE METRIX LEVEL 3) High SOLN    Blood Glucose Monitoring Suppl (TRUE METRIX METER) w/Device KIT USE AS DIRECTED   cyanocobalamin 1000 MCG tablet Take 1,000 mcg by mouth daily.   DROPLET PEN NEEDLES 32G X 4 MM MISC USE 2 (TWO) TIMES DAILY AS NEEDED.   DULoxetine (CYMBALTA) 60 MG capsule TAKE 1 CAPSULE BY MOUTH EVERY DAY   empagliflozin (JARDIANCE) 25 MG TABS tablet Take 1 tablet (25 mg total) by mouth daily.   glucose blood (CONTOUR NEXT TEST) test strip USE AS DIRECTED TWICE A DAY TO TEST BLOOD SUGAR LEVELS   insulin degludec (TRESIBA) 100 UNIT/ML FlexTouch Pen Inject 16 Units into the skin daily.   metFORMIN (GLUCOPHAGE) 1000 MG tablet Take 1 tablet (1,000 mg total) by mouth 2 (two) times daily with a meal.   rosuvastatin (CRESTOR) 40 MG tablet Take 1 tablet (40 mg total) by mouth daily.   sildenafil (VIAGRA) 100 MG tablet TAKE 1 TABLET DAILY AS NEEDED FOR ERECTILE DYSFUNCTION   traZODone (DESYREL) 50 MG tablet Take 1 tablet (50 mg total) by mouth at bedtime as needed for sleep.   triamcinolone ointment (KENALOG) 0.1 %    TRUEplus Lancets 28G MISC    zolpidem (AMBIEN) 10 MG tablet Take 1 tablet (10 mg total) by mouth at bedtime as needed for sleep.   No facility-administered encounter medications on file as of 03/27/2021.   Reviewed chart for medication changes and adherence.  No OVs, Consults, or hospital visits since last care coordination call / Pharmacist visit. No medication changes indicated  No gaps in adherence identified. Patient has follow up scheduled with  pharmacy team. No further action required.  Lizbeth Bark Clinical Pharmacist Assistant [phone]

## 2021-03-28 MED ORDER — SILDENAFIL CITRATE 100 MG PO TABS: ORAL_TABLET | ORAL | 11 refills | Status: DC

## 2021-03-28 MED ORDER — DULOXETINE HCL 60 MG PO CPEP: ORAL_CAPSULE | ORAL | 6 refills | Status: DC

## 2021-04-29 ENCOUNTER — Ambulatory Visit (INDEPENDENT_AMBULATORY_CARE_PROVIDER_SITE_OTHER): Payer: Medicare HMO

## 2021-04-29 DIAGNOSIS — E785 Hyperlipidemia, unspecified: Secondary | ICD-10-CM | POA: Diagnosis not present

## 2021-04-29 DIAGNOSIS — E1149 Type 2 diabetes mellitus with other diabetic neurological complication: Secondary | ICD-10-CM

## 2021-04-29 DIAGNOSIS — E1159 Type 2 diabetes mellitus with other circulatory complications: Secondary | ICD-10-CM | POA: Diagnosis not present

## 2021-04-29 DIAGNOSIS — I152 Hypertension secondary to endocrine disorders: Secondary | ICD-10-CM | POA: Diagnosis not present

## 2021-04-29 NOTE — Patient Instructions (Signed)
Mark Allen,  Thank you for talking with me today. I have included our care plan/goals in the following pages.   Please review and call me at [phone] with any questions.  Thanks! Mark Allen, Pharm.D., BCGP Clinical Pharmacist [phone]  Patient Care Plan: [place] Plan     Problem Identified: Diabetes, HTN, HLD, neuropathy, insomnia   Priority: High     Long-Range Goal: Disease Management   Start Date: 11/26/2020  Recent Progress: On track  Priority: High  Note:   Current Barriers:  Unable to independently monitor therapeutic efficacy Unable to self administer medications as prescribed - wishes to change from daily insulin to weekly GLP-1 Unable to independently afford treatment regimen Does not maintain contact with provider office   Pharmacist Clinical Goal(s):  Over the next 90 days, patient will verbalize ability to afford treatment regimen achieve adherence to monitoring guidelines and medication adherence to achieve therapeutic efficacy maintain control of diabetes as evidenced by hbA1c  through collaboration with PharmD and provider.   Interventions: 1:1 collaboration with Mark Cousins, MD regarding development and update of comprehensive plan of care as evidenced by provider attestation and co-signature Inter-disciplinary care team collaboration (see longitudinal plan of care) Comprehensive medication review performed; medication list updated in electronic medical record  Current Barriers:  Unable to independently monitor therapeutic efficacy Unable to self administer medications as prescribed - wishes to change from daily insulin to weekly GLP-1 Does not maintain contact with provider office   Pharmacist Clinical Goal(s):  Over the next 90 days, patient will verbalize ability to afford treatment regimen achieve adherence to monitoring guidelines and medication adherence to achieve therapeutic efficacy maintain control of  diabetes as evidenced by hbA1c  through collaboration with PharmD and provider.   Interventions: 1:1 collaboration with Mark Cousins, MD regarding development and update of comprehensive plan of care as evidenced by provider attestation and co-signature Inter-disciplinary care team collaboration (see longitudinal plan of care) Comprehensive medication review performed; medication list updated in electronic medical record BP Readings from Last 3 Encounters:  11/26/20 109/60  05/25/20 119/78  02/09/20 136/80    Hypertension (BP goal <130/80) -Controlled -Current treatment: Amlodipine 5 mg qd 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, does not a structured exercise plan -Denies hypotensive/hypertensive symptoms -Educated on BP goals and benefits of medications for prevention of heart attack, stroke and kidney damage; Exercise goal of 150 minutes per week; Importance of home blood pressure monitoring; Symptoms of hypotension and importance of maintaining adequate hydration; -Counseled to monitor BP at home 2-3 times weekly, document, and provide log at future appointments -Counseled on diet and exercise extensively Recommended to continue current medication   Hyperlipidemia: (LDL goal < 70) -Uncontrolled -Current treatment: Rosuvastatin 40 mg qd -Medications previously tried: NA  --Educated on Cholesterol goals;  Benefits of statin for ASCVD risk reduction; Importance of limiting foods high in cholesterol; Exercise goal of 150 minutes per week; Strategies to manage statin-induced myalgias; -Counseled on diet and exercise extensively Recommended to continue current medication Educated on data suggesting coQ10  supplementation may provide benefit for statin associated muscle pain   Diabetes (A1c goal <7%) -Controlled -Current medications: Tresiba 15 units qd Metformin '1000mg'$  bid Jardiance 25 mg qd -Medications previously tried: Trulicity--cost, farxiga-unknown  -  Current home glucose readings fasting glucose: 97 yesterday, 122 today,; usually 128-130 post prandial glucose: 170s, 200 rarely if he eats sweets -Denies hypoglycemic/hyperglycemic symptoms -Current meal patterns:  snacks: sugar-free ice cream ,cookies drinks: zero gatorade, water, occassional beer -Current exercise: 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 Trulicity is a more costly drug and will likley get him into the coverage gap sooner. He previously tolerated and stopped due to cost. Would like to resume and stop daily Tresiba -Counseled to check feet daily and get yearly eye exams -Counseled on diet and exercise extensively Recommended patient discuss GLP-1 with prescriber.   Collaborated with Mark Peper, NP Assessed patient finances. He may be over income for patient assistance based on his estimate. He is most concerned about the coverage gap.  Urine albumin/creatinine ratio: last drawn in 2017 No B12 or Vitamin d level available in chart Previously taking duloxetine 60 mg qd for peripheral neuropathy--has not taken in > 2 months. Denies and withdrawal symptoms. Does not recall taking gabapentin. Could consider gabapentin for neuropathic pain. 02/18/21: Update: Patient is in coverage gap, Trulicity copay increased to $239.27/month. Spoke with Mark Allen, Mark Allen, who states a tier exception can be processed with verbal  consent from patient. Patient will verify with his wife their monthly income to determine PAP eligibility. Recommend 500 mcg B12 qd as last level was low normal and patient has neuropathy. Addenddum: Spoek with patient and informed him to call [facility] and request a Tier exception. Patient confirmed his household income exceeds the limit for NovoNordisk,  Insomnia (Goal: Improve qol minimize symptoms, prevent adverse events -Controlled -Current treatment  Trazodone 50 mg qd Zolpidem 10 mg qd prn --uses very rarely if trazodone ineffective -Medications previously tried: NA  Patient reports he falls asleep easily but wakes at 2am if he does not take trazodone. He rarely uses zolpidem.    Patient Goals/Self-Care Activities Over the next 90 days, patient will:  - take medications as prescribed focus on medication adherence by utilizing pill box check glucose twice daily, document, and provide at future appointments check blood pressure 2-3 times weekly, document, and provide at future appointments collaborate with provider on medication access solutions  Follow Up Plan: Telephone follow up appointment with care management team member scheduled for: 3 months          The patient verbalized understanding of instructions provided today and agreed to receive a MyChart copy of patient instruction and/or educational materials. Telephone follow up appointment with pharmacy team member scheduled for: See next appointment with "Care Management Staff" under "What's Next" below.

## 2021-04-29 NOTE — Progress Notes (Signed)
Chronic Care Management Pharmacy Note  04/29/2021 Name:  Mark Allen MRN:  [id number] DOB:  1950-10-03  Recommendations/Changes made from today's visit: No Rx changes. Counseled on metformin administration due to ongoing GI symptoms and not taking with meals  Plan: Diabetes call one month to check on side effects/readings.   Subjective: Mark Allen is an 70 y.o. year old male who is a primary patient of Vigg, Avanti, MD.  The CCM team was consulted for assistance with disease management and care coordination needs.    Biggest complaint is loose stools, had not been taking metformin with meals. Some impact on quality of life. Agreeable to adjusting administration to twice daily with breakfast and dinner.  Reports FBGs 130s-140s most often. Very rarely low, almost never less than 100 FBG.  Engaged with patient by telephone for follow up visit in response to provider referral for pharmacy case management and/or care coordination services.   Consent to Services:  The patient was given information about Chronic Care Management services, agreed to services, and gave verbal consent prior to initiation of services.  Please see initial visit note for detailed documentation.   Patient Care Team: Charlynne Cousins, MD as PCP - Verdia Kuba, [facility] (Pharmacist) Vladimir Faster, [facility] (Pharmacist)  Objective:  Lab Results  Component Value Date   CREATININE 0.82 11/26/2020   CREATININE 0.80 05/25/2020   CREATININE 0.95 11/24/2019    Lab Results  Component Value Date   HGBA1C 6.4 02/28/2021   Last diabetic Eye exam:  Lab Results  Component Value Date/Time   HMDIABEYEEXA No Retinopathy 09/18/2020 12:00 AM    Last diabetic Foot exam: No results found for: HMDIABFOOTEX      Component Value Date/Time   CHOL 147 11/26/2020 1506   CHOL 145 03/23/2017 1607   TRIG 144 11/26/2020 1506   TRIG 74 03/23/2017 1607   HDL 54 11/26/2020 1506   CHOLHDL 2.6 11/10/2017 1541   VLDL 15  03/23/2017 1607   LDLCALC 68 11/26/2020 1506    Hepatic Function Latest Ref Rng & Units 11/26/2020 05/25/2020 11/24/2019  Total Protein 6.0 - 8.5 g/dL 7.2 7.5 6.9  Albumin 3.8 - 4.8 g/dL 4.6 4.7 4.5  AST 0 - 40 IU/L $Remov'13 14 16  'QvzkZg$ ALT 0 - 44 IU/L $Remov'17 21 21  'WzXCdv$ Alk Phosphatase 44 - 121 IU/L 72 76 65  Total Bilirubin 0.0 - 1.2 mg/dL 0.3 0.3 0.3    Lab Results  Component Value Date/Time   TSH 2.100 11/10/2017 03:41 PM   TSH 1.650 09/16/2016 02:03 PM    CBC Latest Ref Rng & Units 11/26/2020 05/25/2020 11/24/2019  WBC 3.4 - 10.8 x10E3/uL 7.9 8.9 6.5  Hemoglobin 13.0 - 17.7 g/dL 15.5 16.1 15.4  Hematocrit 37.5 - 51.0 % 46.0 47.5 45.0  Platelets 150 - 450 x10E3/uL 345 344 337    No results found for: VD25OH  Clinical ASCVD:  The 10-year ASCVD risk score [name], et al., 2013) is: 26%   Values used to calculate the score:     Age: 73 years     Sex: Male     Is Non-Hispanic African American: No     Diabetic: Yes     Tobacco smoker: No     Systolic Blood Pressure: 892 mmHg     Is BP treated: Yes     HDL Cholesterol: 54 mg/dL     Total Cholesterol: 147 mg/dL    Social History   Tobacco Use  Smoking Status Former   Types: Cigarettes   Quit date: 01/27/1989   Years since quitting: 32.2  Smokeless Tobacco Former   Types: Chew   Quit date: 09/29/1990   BP Readings from Last 3 Encounters:  02/28/21 126/75  11/26/20 109/60  05/25/20 119/78   Pulse Readings from Last 3 Encounters:  02/28/21 69  11/26/20 73  05/25/20 72   Wt Readings from Last 3 Encounters:  02/28/21 199 lb 9.6 oz (90.5 kg)  11/26/20 206 lb (93.4 kg)  08/31/20 195 lb (88.5 kg)    Assessment: Review of patient past medical history, allergies, medications, health status, including review of consultants reports, laboratory and other test data, was performed as part of comprehensive evaluation and provision of chronic care management services.   SDOH:  (Social Determinants of Health) assessments and interventions  performed: Yes   CCM Care Plan  Allergies  Allergen Reactions   Sulfa Antibiotics    Sulfacetamide Sodium     Medications Reviewed Today     Reviewed by Dahlia Byes, [facility] (Pharmacist) on 04/29/21 at 1124  Med List Status: <None>   Medication Order Taking? Sig Documenting Provider Last Dose Status Informant  amLODipine (NORVASC) 5 MG tablet [id number] No Take 1 tablet (5 mg total) by mouth daily. McElwee, Lauren A, NP Taking Active   benazepril (LOTENSIN) 40 MG tablet [id number] No Take 1 tablet (40 mg total) by mouth daily. McElwee, Lauren A, NP Taking Active   Blood Glucose Calibration (TRUE METRIX LEVEL 3) High SOLN [id number] No  [provider] Taking Active   Blood Glucose Monitoring Suppl (TRUE METRIX METER) w/Device KIT [id number] No USE AS DIRECTED Jalynn, Betzold, DO Taking Active   cyanocobalamin 1000 MCG tablet [id number] No Take 1,000 mcg by mouth daily. [provider] Taking Active   DROPLET PEN NEEDLES 32G X 4 MM MISC [id number] No USE 2 (TWO) TIMES DAILY AS NEEDED. Particia Nearing, [place] Taking Active   DULoxetine (CYMBALTA) 60 MG capsule [id number]  TAKE 1 CAPSULE BY MOUTH EVERY DAY McElwee, Lauren A, NP  Active   empagliflozin (JARDIANCE) 25 MG TABS tablet [id number]  Take 1 tablet (25 mg total) by mouth daily. McElwee, Lauren A, NP  Active   glucose blood (CONTOUR NEXT TEST) test strip [id number] No USE AS DIRECTED TWICE A DAY TO TEST BLOOD SUGAR LEVELS Particia Nearing, PA-C Taking Active   insulin degludec (TRESIBA) 100 UNIT/ML FlexTouch Pen [id number]  Inject 16 Units into the skin daily. McElwee, Lauren A, NP  Active   metFORMIN (GLUCOPHAGE) 1000 MG tablet [id number] No Take 1 tablet (1,000 mg total) by mouth 2 (two) times daily with a meal. McElwee, Lauren A, NP Taking Active   rosuvastatin (CRESTOR) 40 MG tablet [id number] No Take 1 tablet (40 mg total) by mouth daily. McElwee, Lauren A, NP Taking Active   sildenafil (VIAGRA) 100 MG tablet  [id number]  TAKE 1 TABLET DAILY AS NEEDED FOR ERECTILE DYSFUNCTION McElwee, Lauren A, NP  Active   traZODone (DESYREL) 50 MG tablet [id number] No Take 1 tablet (50 mg total) by mouth at bedtime as needed for sleep. McElwee, Lauren A, NP Taking Active   triamcinolone ointment (KENALOG) 0.1 % [id number] No  [provider] Taking Active   TRUEplus Lancets 28G MISC [id number] No  [provider] Taking Active   zolpidem (AMBIEN) 10 MG tablet [id number] No Take 1 tablet (10 mg total) by mouth at bedtime as needed for sleep. Particia Nearing, [place] Taking Active  Patient Active Problem List   Diagnosis Date Noted   Vitamin B 12 deficiency 02/28/2021   ED (erectile dysfunction) 09/19/2018   Insomnia 05/29/2015   Type II diabetes mellitus with neurological manifestations ([place])    Hypertension associated with diabetes ([place])    Hyperlipidemia    History of nonmelanoma skin cancer 03/03/2013    Immunization History  Administered Date(s) Administered   Fluad Quad(high Dose 65+) 06/12/2020   Influenza, High Dose Seasonal PF 06/16/2018, 07/01/2019   Influenza,inj,Quad PF,6+ Mos 07/06/2015, 06/25/2016, 06/23/2017   Influenza-Unspecified 08/17/2014, 06/22/2019   PFIZER(Purple Top)SARS-COV-2 Vaccination 12/12/2019, 01/02/2020   Pneumococcal Conjugate-13 03/15/2018   Pneumococcal Polysaccharide-23 06/12/2020   Pneumococcal-Unspecified 07/10/2008   Td 07/17/2009, 05/25/2020   Zoster, Live 08/10/2012    Conditions to be addressed/monitored: T2DM, HTN, HLD, Insomnia, ED, b12 deficiency  Current Barriers:  Potential GI side effects with metformin  Pharmacist Clinical Goal(s):  Patient will verbalize ability to afford treatment regimen contact provider office for questions/concerns as evidenced notation of same in electronic health record through collaboration with PharmD and provider.   Interventions: 1:1 collaboration with Charlynne Cousins, MD regarding  development and update of comprehensive plan of care as evidenced by provider attestation and co-signature Inter-disciplinary care team collaboration (see longitudinal plan of care) Comprehensive medication review performed; medication list updated in electronic medical record  Hypertension  (Status:New goal.)   Med Management Intervention:  no changes  (BP goal <130/80) -Controlled -Current treatment: Amlodipine 5 mg once daily Benazepril 40 mg once daily  -Medications previously tried: none  -Current home readings: 120s/70s-80s. -Denies hypotensive/hypertensive symptoms -Educated on Daily salt intake goal < 2300 mg; Importance of home blood pressure monitoring; Proper BP monitoring technique; -Counseled to monitor BP at home 1-2x/week, document, and provide log at future appointments -Counseled on diet and exercise extensively Recommended to continue current medication  Hyperlipidemia: (LDL goal < 70) -Controlled -Current treatment: Rosuvastatin 40 mg once daily -Educated on Exercise goal of 150 minutes per week; -Counseled on diet and exercise extensively Recommended to continue current medication  Diabetes (A1c goal <7%) -Controlled -Current medications: Tresiba 16 units daily Metformin 1000 mg twice daily  Jardiance 25 mg once daily  -Current home glucose readings fasting glucose: 130s-140s -Denies hypoglycemic/hyperglycemic symptoms -Current meal patterns: occasional sweets, knows to minimize intake of carbs -Current exercise: active outdoors, golf 2x/week. Intends to get back to [employer] at some point. -Educated on  proper use of metformin with meal meals due to ongoing GI symptoms -Recommended to continue current medication Plan for CPA DM call one month to check on BG and frequency of loose stools due to metformin. Next step would be to reach out to [facility] and request change to XR preparation.  Patient Goals/Self-Care Activities Patient will:  -  take medications as prescribed check blood pressure 1-2x/week, document, and provide at future appointments Start taking metformin with meals.  Follow Up Plan:  DM call 1 months, RPH f/u call three months  Medication Assistance:  Would not be eligible for Jardiance Patient Assistance based on monthly income.   Patient's preferred pharmacy is:  [facility] [address] [place] - [place] [address] Phone: [phone] Fax: [place], [place] [address] [place] [address] Phone: [phone] Fax: [place], [place]  [place] [address] Phone: [phone] Fax: [phone]  [facility] - [place], [place] [address] Phone: [phone] Fax: [phone]  [place] [id number] [place] [place], [place] - [place] [address] Phone: [phone] Fax: [phone]  Follow Up:  Patient agrees to Care Plan and Follow-up.  Plan:  [place] f/u call 6 months. CPA 1 month f/u call.  Future Appointments  Date Time Provider [place]  05/31/2021 10:40 AM Charlynne Cousins, MD [facility]  09/02/2021  3:15 PM CFP NURSE HEALTH ADVISOR [facility]FP-[facility]

## 2021-05-06 DIAGNOSIS — L814 Other melanin hyperpigmentation: Secondary | ICD-10-CM | POA: Diagnosis not present

## 2021-05-06 DIAGNOSIS — D229 Melanocytic nevi, unspecified: Secondary | ICD-10-CM | POA: Diagnosis not present

## 2021-05-06 DIAGNOSIS — L821 Other seborrheic keratosis: Secondary | ICD-10-CM | POA: Diagnosis not present

## 2021-05-06 DIAGNOSIS — D492 Neoplasm of unspecified behavior of bone, soft tissue, and skin: Secondary | ICD-10-CM | POA: Diagnosis not present

## 2021-05-06 DIAGNOSIS — D230 Other benign neoplasm of skin of lip: Secondary | ICD-10-CM | POA: Diagnosis not present

## 2021-05-06 DIAGNOSIS — Z85828 Personal history of other malignant neoplasm of skin: Secondary | ICD-10-CM | POA: Diagnosis not present

## 2021-05-21 ENCOUNTER — Telehealth: Payer: Medicare HMO

## 2021-05-21 NOTE — Chronic Care Management (AMB) (Signed)
    Chronic Care Management Pharmacy Assistant   Name: Mark Allen  MRN: [id number] DOB: [birth year]  Reason for Encounter: Follow up on Metformin medication   Patient states he is doing a lot better since taking his metformin medication after meals. Patient also states he has been playing golf and will start back up to going to [employer] for his physical activity in 2 weeks.

## 2021-05-31 ENCOUNTER — Ambulatory Visit: Payer: Medicare HMO | Admitting: Internal Medicine

## 2021-06-07 MED ORDER — INSULIN DEGLUDEC 100 UNIT/ML ~~LOC~~ SOPN: 16.0000 [IU] | PEN_INJECTOR | Freq: Every day | SUBCUTANEOUS | 1 refills | Status: DC

## 2021-06-18 ENCOUNTER — Ambulatory Visit: Payer: Medicare HMO | Admitting: Internal Medicine

## 2021-06-18 ENCOUNTER — Ambulatory Visit (INDEPENDENT_AMBULATORY_CARE_PROVIDER_SITE_OTHER): Payer: Medicare HMO | Admitting: Internal Medicine

## 2021-06-18 ENCOUNTER — Encounter: Payer: Self-pay | Admitting: Internal Medicine

## 2021-06-18 ENCOUNTER — Other Ambulatory Visit: Payer: Self-pay

## 2021-06-18 VITALS — BP 112/71 | HR 72 | Temp 97.9°F | Ht 69.7 in | Wt 203.4 lb

## 2021-06-18 DIAGNOSIS — Z125 Encounter for screening for malignant neoplasm of prostate: Secondary | ICD-10-CM | POA: Diagnosis not present

## 2021-06-18 DIAGNOSIS — E1369 Other specified diabetes mellitus with other specified complication: Secondary | ICD-10-CM

## 2021-06-18 DIAGNOSIS — H9319 Tinnitus, unspecified ear: Secondary | ICD-10-CM

## 2021-06-18 DIAGNOSIS — E119 Type 2 diabetes mellitus without complications: Secondary | ICD-10-CM | POA: Insufficient documentation

## 2021-06-18 DIAGNOSIS — G6289 Other specified polyneuropathies: Secondary | ICD-10-CM

## 2021-06-18 DIAGNOSIS — Z23 Encounter for immunization: Secondary | ICD-10-CM | POA: Diagnosis not present

## 2021-06-18 LAB — URINALYSIS, ROUTINE W REFLEX MICROSCOPIC
Bilirubin, UA: NEGATIVE
Ketones, UA: NEGATIVE
Leukocytes,UA: NEGATIVE
Nitrite, UA: NEGATIVE
RBC, UA: NEGATIVE
Specific Gravity, UA: 1.025 (ref 1.005–1.030)
Urobilinogen, Ur: 0.2 mg/dL (ref 0.2–1.0)
pH, UA: 5.5 (ref 5.0–7.5)

## 2021-06-18 LAB — BAYER DCA HB A1C WAIVED: HB A1C (BAYER DCA - WAIVED): 6.6 % — ABNORMAL HIGH (ref 4.8–5.6)

## 2021-06-18 NOTE — Progress Notes (Signed)
BP 112/71   Pulse 72   Temp 97.9 F (36.6 C) (Oral)   Ht 5' 9.7" (1.77 m)   Wt 203 lb 6.4 oz (92.3 kg)   SpO2 95%   BMI 29.44 kg/m    Subjective:    Patient ID: Mark Allen, male    DOB: 10-24-1950, 70 y.o.   MRN: [id number]  Chief Complaint  Patient presents with   Diabetes   Hyperlipidemia   Hypertension    HPI: Mark Allen is a 70 y.o. male  Diabetes He presents for his follow-up (a1c at 6.4 FSBS - 130 - 140) diabetic visit. He has type 2 diabetes mellitus. Pertinent negatives for hypoglycemia include no confusion, dizziness, headaches, mood changes, speech difficulty or tremors. Pertinent negatives for diabetes include no blurred vision, no chest pain, no fatigue, no polydipsia, no polyphagia, no polyuria and no weakness.  Hyperlipidemia This is a chronic problem. The problem is controlled. Pertinent negatives include no chest pain, myalgias or shortness of breath.  Hypertension This is a chronic problem. The current episode started more than 1 year ago. The problem is controlled. Pertinent negatives include no anxiety, blurred vision, chest pain, headaches, malaise/fatigue, neck pain, palpitations, peripheral edema or shortness of breath.   Chief Complaint  Patient presents with   Diabetes   Hyperlipidemia   Hypertension    Relevant past medical, surgical, family and social history reviewed and updated as indicated. Interim medical history since our last visit reviewed. Allergies and medications reviewed and updated.  Review of Systems  Constitutional:  Negative for activity change, appetite change, chills, fatigue, fever and malaise/fatigue.  HENT:  Negative for congestion, ear discharge, ear pain and facial swelling.   Eyes:  Negative for blurred vision, pain, discharge and itching.  Respiratory:  Negative for cough, chest tightness, shortness of breath and wheezing.   Cardiovascular:  Negative for chest pain, palpitations and leg swelling.   Gastrointestinal:  Negative for abdominal distention, abdominal pain, blood in stool, constipation, diarrhea, nausea and vomiting.  Endocrine: Negative for cold intolerance, heat intolerance, polydipsia, polyphagia and polyuria.  Genitourinary:  Negative for difficulty urinating, dysuria, flank pain, frequency, hematuria and urgency.  Musculoskeletal:  Negative for arthralgias, gait problem, joint swelling, myalgias and neck pain.  Skin:  Negative for color change, rash and wound.  Neurological:  Negative for dizziness, tremors, speech difficulty, weakness, light-headedness, numbness and headaches.  Hematological:  Does not bruise/bleed easily.  Psychiatric/Behavioral:  Negative for agitation, confusion, decreased concentration, sleep disturbance and suicidal ideas.    Per HPI unless specifically indicated above     Objective:    BP 112/71   Pulse 72   Temp 97.9 F (36.6 C) (Oral)   Ht 5' 9.7" (1.77 m)   Wt 203 lb 6.4 oz (92.3 kg)   SpO2 95%   BMI 29.44 kg/m   Wt Readings from Last 3 Encounters:  06/18/21 203 lb 6.4 oz (92.3 kg)  02/28/21 199 lb 9.6 oz (90.5 kg)  11/26/20 206 lb (93.4 kg)    Physical Exam Vitals and nursing note reviewed.  Constitutional:      General: He is not in acute distress.    Appearance: Normal appearance. He is not toxic-appearing or diaphoretic.  HENT:     Head: Normocephalic and atraumatic.     Right Ear: Tympanic membrane and external ear normal. There is no impacted cerumen.     Left Ear: External ear normal.     Nose: No congestion or rhinorrhea.  Mouth/Throat:     Pharynx: No oropharyngeal exudate or posterior oropharyngeal erythema.  Eyes:     Conjunctiva/sclera: Conjunctivae normal.     Pupils: Pupils are equal, round, and reactive to light.  Cardiovascular:     Rate and Rhythm: Normal rate and regular rhythm.     Heart sounds: No murmur heard.   No friction rub. No gallop.  Pulmonary:     Effort: No respiratory distress.      Breath sounds: No stridor. No wheezing or rhonchi.  Chest:     Chest wall: No tenderness.  Abdominal:     General: Abdomen is flat. Bowel sounds are normal. There is no distension.     Palpations: Abdomen is soft. There is no mass.     Tenderness: There is no abdominal tenderness. There is no guarding.  Musculoskeletal:        General: No swelling or deformity.     Cervical back: Normal range of motion and neck supple. No rigidity or tenderness.     Right lower leg: No edema.     Left lower leg: No edema.  Skin:    General: Skin is warm and dry.     Coloration: Skin is not jaundiced.     Findings: No erythema.  Neurological:     Mental Status: He is alert and oriented to person, place, and time. Mental status is at baseline.  Psychiatric:        Mood and Affect: Mood normal.        Behavior: Behavior normal.        Thought Content: Thought content normal.        Judgment: Judgment normal.    Results for orders placed or performed in visit on 02/28/21  Bayer DCA Hb A1c Waived  Result Value Ref Range   HB A1C (BAYER DCA - WAIVED) 6.4 <7.0 %  Vitamin B12  Result Value Ref Range   Vitamin B-12 >2000 (H) 232 - 1245 pg/mL        Current Outpatient Medications:    amLODipine (NORVASC) 5 MG tablet, Take 1 tablet (5 mg total) by mouth daily., Disp: 90 tablet, Rfl: 1   benazepril (LOTENSIN) 40 MG tablet, Take 1 tablet (40 mg total) by mouth daily., Disp: 90 tablet, Rfl: 1   Blood Glucose Calibration (TRUE METRIX LEVEL 3) High SOLN, , Disp: , Rfl:    Blood Glucose Monitoring Suppl (TRUE METRIX METER) w/Device KIT, USE AS DIRECTED, Disp: 1 kit, Rfl: 1   cyanocobalamin 1000 MCG tablet, Take 1,000 mcg by mouth daily., Disp: , Rfl:    DROPLET PEN NEEDLES 32G X 4 MM MISC, USE 2 (TWO) TIMES DAILY AS NEEDED., Disp: 200 each, Rfl: 2   DULoxetine (CYMBALTA) 60 MG capsule, TAKE 1 CAPSULE BY MOUTH EVERY DAY, Disp: 30 capsule, Rfl: 6   empagliflozin (JARDIANCE) 25 MG TABS tablet, Take 1 tablet  (25 mg total) by mouth daily., Disp: 90 tablet, Rfl: 0   glucose blood (CONTOUR NEXT TEST) test strip, USE AS DIRECTED TWICE A DAY TO TEST BLOOD SUGAR LEVELS, Disp: 100 each, Rfl: 11   insulin degludec (TRESIBA) 100 UNIT/ML FlexTouch Pen, Inject 16 Units into the skin daily., Disp: 9 mL, Rfl: 1   metFORMIN (GLUCOPHAGE) 1000 MG tablet, Take 1 tablet (1,000 mg total) by mouth 2 (two) times daily with a meal., Disp: 180 tablet, Rfl: 1   rosuvastatin (CRESTOR) 40 MG tablet, Take 1 tablet (40 mg total) by mouth daily., Disp: 90 tablet,  Rfl: 1   sildenafil (VIAGRA) 100 MG tablet, TAKE 1 TABLET DAILY AS NEEDED FOR ERECTILE DYSFUNCTION, Disp: 6 tablet, Rfl: 11   traZODone (DESYREL) 50 MG tablet, Take 1 tablet (50 mg total) by mouth at bedtime as needed for sleep., Disp: 90 tablet, Rfl: 1   TRUEplus Lancets 28G MISC, , Disp: , Rfl:    zolpidem (AMBIEN) 10 MG tablet, Take 1 tablet (10 mg total) by mouth at bedtime as needed for sleep., Disp: 30 tablet, Rfl: 2    Assessment & Plan:  DM :  Tresiba 16 untis q pm, metformin 1000 mg bid and jardiance 25 mg. Consider ozempic.  check HbA1c,  urine  microalbumin  diabetic diet plan given to pt  adviced regarding hypoglycemia and instructions given to pt today on how to prevent and treat the same if it were to occur. pt acknowledges the plan and voices understanding of the same.  exercise plan given and encouraged.   advice diabetic yearly podiatry, ophthalmology , nutritionist , dental check q 6 months  2. HLD : Is on crestor 40 mg daily for such  recheck FLP, check LFT's work on diet, SE of meds explained to pt. low fat and high fiber diet explained to pt.  3. HTN  Is on norvasc and benzapril for such  Continue current meds.  Medication compliance emphasised. pt advised to keep Bp logs. Pt verbalised understanding of the same. Pt to have a low salt diet . Exercise to reach a goal of at least 150 mins a week.  lifestyle modifications explained and pt understands  importance of the above.   4. Tinnitus : is seen by ENT  5. Peripheral neuropathy: is on Cymbalta for such stable, chronic. Continue current meds.   Problem List Items Addressed This Visit   None Visit Diagnoses     Need for influenza vaccination    -  Primary   Relevant Orders   Flu Vaccine QUAD High Dose(Fluad) (Completed)        Orders Placed This Encounter  Procedures   Flu Vaccine QUAD High Dose(Fluad)     No orders of the defined types were placed in this encounter.    Follow up plan: No follow-ups on file.  Health Maintenance : PSA : today  Cscope :2025 due next

## 2021-06-19 ENCOUNTER — Other Ambulatory Visit: Payer: Self-pay | Admitting: Nurse Practitioner

## 2021-06-19 LAB — BASIC METABOLIC PANEL
BUN/Creatinine Ratio: 22 (ref 10–24)
BUN: 17 mg/dL (ref 8–27)
CO2: 21 mmol/L (ref 20–29)
Calcium: 9.7 mg/dL (ref 8.6–10.2)
Chloride: 100 mmol/L (ref 96–106)
Creatinine, Ser: 0.76 mg/dL (ref 0.76–1.27)
Glucose: 113 mg/dL — ABNORMAL HIGH (ref 65–99)
Potassium: 4.7 mmol/L (ref 3.5–5.2)
Sodium: 141 mmol/L (ref 134–144)
eGFR: 98 mL/min/{1.73_m2} (ref >59)

## 2021-06-19 LAB — PSA: Prostate Specific Ag, Serum: 0.4 ng/mL (ref 0.0–4.0)

## 2021-06-19 NOTE — Telephone Encounter (Signed)
Rx sent as no print- not received by pharmacy- resent as previous per request

## 2021-07-01 DIAGNOSIS — H9319 Tinnitus, unspecified ear: Secondary | ICD-10-CM | POA: Insufficient documentation

## 2021-07-01 DIAGNOSIS — G629 Polyneuropathy, unspecified: Secondary | ICD-10-CM | POA: Insufficient documentation

## 2021-07-01 DIAGNOSIS — Z23 Encounter for immunization: Secondary | ICD-10-CM | POA: Insufficient documentation

## 2021-07-17 ENCOUNTER — Encounter: Payer: Self-pay | Admitting: Internal Medicine

## 2021-07-18 MED ORDER — EMPAGLIFLOZIN 25 MG PO TABS: 25.0000 mg | ORAL_TABLET | Freq: Every day | ORAL | 1 refills | Status: DC

## 2021-07-23 ENCOUNTER — Other Ambulatory Visit: Payer: Self-pay

## 2021-07-23 ENCOUNTER — Other Ambulatory Visit: Payer: Medicare HMO

## 2021-07-23 DIAGNOSIS — Z1329 Encounter for screening for other suspected endocrine disorder: Secondary | ICD-10-CM | POA: Diagnosis not present

## 2021-07-23 DIAGNOSIS — E785 Hyperlipidemia, unspecified: Secondary | ICD-10-CM

## 2021-07-23 DIAGNOSIS — I10 Essential (primary) hypertension: Secondary | ICD-10-CM

## 2021-07-24 LAB — LIPID PANEL
Chol/HDL Ratio: 2.7 ratio (ref 0.0–5.0)
Cholesterol, Total: 154 mg/dL (ref 100–199)
HDL: 58 mg/dL (ref >39)
LDL Chol Calc (NIH): 78 mg/dL (ref 0–99)
Triglycerides: 96 mg/dL (ref 0–149)
VLDL Cholesterol Cal: 18 mg/dL (ref 5–40)

## 2021-07-24 LAB — CBC WITH DIFFERENTIAL/PLATELET
Basophils Absolute: 0.1 10*3/uL (ref 0.0–0.2)
Basos: 1 %
EOS (ABSOLUTE): 0.2 10*3/uL (ref 0.0–0.4)
Eos: 3 %
Hematocrit: 45.9 % (ref 37.5–51.0)
Hemoglobin: 15.4 g/dL (ref 13.0–17.7)
Immature Grans (Abs): 0 10*3/uL (ref 0.0–0.1)
Immature Granulocytes: 0 %
Lymphocytes Absolute: 2 10*3/uL (ref 0.7–3.1)
Lymphs: 32 %
MCH: 31.9 pg (ref 26.6–33.0)
MCHC: 33.6 g/dL (ref 31.5–35.7)
MCV: 95 fL (ref 79–97)
Monocytes Absolute: 0.8 10*3/uL (ref 0.1–0.9)
Monocytes: 13 %
Neutrophils Absolute: 3.2 10*3/uL (ref 1.4–7.0)
Neutrophils: 51 %
Platelets: 341 10*3/uL (ref 150–450)
RBC: 4.83 x10E6/uL (ref 4.14–5.80)
RDW: 12.1 % (ref 11.6–15.4)
WBC: 6.3 10*3/uL (ref 3.4–10.8)

## 2021-07-24 LAB — COMPREHENSIVE METABOLIC PANEL
ALT: 21 IU/L (ref 0–44)
AST: 16 IU/L (ref 0–40)
Albumin/Globulin Ratio: 2 (ref 1.2–2.2)
Albumin: 4.6 g/dL (ref 3.8–4.8)
Alkaline Phosphatase: 72 IU/L (ref 44–121)
BUN/Creatinine Ratio: 22 (ref 10–24)
BUN: 16 mg/dL (ref 8–27)
Bilirubin Total: 0.5 mg/dL (ref 0.0–1.2)
CO2: 23 mmol/L (ref 20–29)
Calcium: 9.4 mg/dL (ref 8.6–10.2)
Chloride: 97 mmol/L (ref 96–106)
Creatinine, Ser: 0.73 mg/dL — ABNORMAL LOW (ref 0.76–1.27)
Globulin, Total: 2.3 g/dL (ref 1.5–4.5)
Glucose: 132 mg/dL — ABNORMAL HIGH (ref 70–99)
Potassium: 5.1 mmol/L (ref 3.5–5.2)
Sodium: 136 mmol/L (ref 134–144)
Total Protein: 6.9 g/dL (ref 6.0–8.5)
eGFR: 99 mL/min/{1.73_m2} (ref >59)

## 2021-07-24 LAB — TSH: TSH: 1.76 u[IU]/mL (ref 0.450–4.500)

## 2021-07-30 ENCOUNTER — Ambulatory Visit (INDEPENDENT_AMBULATORY_CARE_PROVIDER_SITE_OTHER): Payer: Medicare HMO | Admitting: Internal Medicine

## 2021-07-30 ENCOUNTER — Other Ambulatory Visit: Payer: Self-pay

## 2021-07-30 ENCOUNTER — Encounter: Payer: Self-pay | Admitting: Internal Medicine

## 2021-07-30 VITALS — BP 111/74 | HR 63 | Temp 98.6°F | Ht 69.69 in | Wt 205.2 lb

## 2021-07-30 DIAGNOSIS — E1149 Type 2 diabetes mellitus with other diabetic neurological complication: Secondary | ICD-10-CM | POA: Diagnosis not present

## 2021-07-30 DIAGNOSIS — Z23 Encounter for immunization: Secondary | ICD-10-CM | POA: Insufficient documentation

## 2021-07-30 DIAGNOSIS — E119 Type 2 diabetes mellitus without complications: Secondary | ICD-10-CM | POA: Diagnosis not present

## 2021-07-30 DIAGNOSIS — E785 Hyperlipidemia, unspecified: Secondary | ICD-10-CM

## 2021-07-30 HISTORY — DX: Encounter for immunization: Z23

## 2021-07-30 HISTORY — DX: Type 2 diabetes mellitus without complications: E11.9

## 2021-07-30 LAB — BAYER DCA HB A1C WAIVED: HB A1C (BAYER DCA - WAIVED): 6.6 % — ABNORMAL HIGH (ref 4.8–5.6)

## 2021-07-30 NOTE — Progress Notes (Signed)
Please let pt know this was normal. Can try and wean off insulin reduce to 10 units x 1 week then 5 units x 1 week then stop to fu with me in 6 weeks please thnx.

## 2021-07-30 NOTE — Progress Notes (Signed)
BP 111/74   Pulse 63   Temp 98.6 F (37 C) (Oral)   Ht 5' 9.69" (1.77 m)   Wt 205 lb 3.2 oz (93.1 kg)   BMI 29.71 kg/m    Subjective:    Patient ID: Mark Allen, male    DOB: 1951/08/01, 70 y.o.   MRN: [id number]  Chief Complaint  Patient presents with   Diabetes   Hypertension    HPI: Mark Allen is a 70 y.o. male  Pt is here for a follow up  Diabetes He presents for his follow-up (hasnt started ozempic yet, is still on insulin) diabetic visit. He has type 2 diabetes mellitus. His disease course has been fluctuating. Pertinent negatives for diabetes include no blurred vision, no chest pain, no fatigue, no foot paresthesias, no polydipsia, no polyphagia and no polyuria.  Hypertension This is a chronic problem. The current episode started more than 1 year ago. The problem has been resolved since onset. Pertinent negatives include no anxiety, blurred vision, chest pain or malaise/fatigue.   Chief Complaint  Patient presents with   Diabetes   Hypertension    Relevant past medical, surgical, family and social history reviewed and updated as indicated. Interim medical history since our last visit reviewed. Allergies and medications reviewed and updated.  Review of Systems  Constitutional:  Negative for fatigue and malaise/fatigue.  Eyes:  Negative for blurred vision.  Cardiovascular:  Negative for chest pain.  Endocrine: Negative for polydipsia, polyphagia and polyuria.   Per HPI unless specifically indicated above     Objective:    BP 111/74   Pulse 63   Temp 98.6 F (37 C) (Oral)   Ht 5' 9.69" (1.77 m)   Wt 205 lb 3.2 oz (93.1 kg)   BMI 29.71 kg/m   Wt Readings from Last 3 Encounters:  07/30/21 205 lb 3.2 oz (93.1 kg)  06/18/21 203 lb 6.4 oz (92.3 kg)  02/28/21 199 lb 9.6 oz (90.5 kg)    Physical Exam Vitals and nursing note reviewed.  Constitutional:      General: He is not in acute distress.    Appearance: Normal appearance. He is not  ill-appearing or diaphoretic.  HENT:     Head: Normocephalic and atraumatic.     Right Ear: Tympanic membrane and external ear normal. There is no impacted cerumen.     Left Ear: External ear normal.     Nose: No congestion or rhinorrhea.     Mouth/Throat:     Pharynx: No oropharyngeal exudate or posterior oropharyngeal erythema.  Eyes:     Conjunctiva/sclera: Conjunctivae normal.     Pupils: Pupils are equal, round, and reactive to light.  Cardiovascular:     Rate and Rhythm: Normal rate and regular rhythm.     Heart sounds: No murmur heard.   No friction rub. No gallop.  Pulmonary:     Effort: No respiratory distress.     Breath sounds: No stridor. No wheezing or rhonchi.  Chest:     Chest wall: No tenderness.  Abdominal:     General: Abdomen is flat. Bowel sounds are normal.     Palpations: Abdomen is soft. There is no mass.     Tenderness: There is no abdominal tenderness.  Musculoskeletal:     Cervical back: Normal range of motion and neck supple. No rigidity or tenderness.     Left lower leg: No edema.  Skin:    General: Skin is warm and dry.  Neurological:  Mental Status: He is alert.    Results for orders placed or performed in visit on 07/23/21  CBC w/Diff  Result Value Ref Range   WBC 6.3 3.4 - 10.8 x10E3/uL   RBC 4.83 4.14 - 5.80 x10E6/uL   Hemoglobin 15.4 13.0 - 17.7 g/dL   Hematocrit 45.9 37.5 - 51.0 %   MCV 95 79 - 97 fL   MCH 31.9 26.6 - 33.0 pg   MCHC 33.6 31.5 - 35.7 g/dL   RDW 12.1 11.6 - 15.4 %   Platelets 341 150 - 450 x10E3/uL   Neutrophils 51 Not Estab. %   Lymphs 32 Not Estab. %   Monocytes 13 Not Estab. %   Eos 3 Not Estab. %   Basos 1 Not Estab. %   Neutrophils Absolute 3.2 1.4 - 7.0 x10E3/uL   Lymphocytes Absolute 2.0 0.7 - 3.1 x10E3/uL   Monocytes Absolute 0.8 0.1 - 0.9 x10E3/uL   EOS (ABSOLUTE) 0.2 0.0 - 0.4 x10E3/uL   Basophils Absolute 0.1 0.0 - 0.2 x10E3/uL   Immature Granulocytes 0 Not Estab. %   Immature Grans (Abs) 0.0 0.0 -  0.1 x10E3/uL  Comprehensive metabolic panel  Result Value Ref Range   Glucose 132 (H) 70 - 99 mg/dL   BUN 16 8 - 27 mg/dL   Creatinine, Ser 0.73 (L) 0.76 - 1.27 mg/dL   eGFR 99 >59 mL/min/1.73   BUN/Creatinine Ratio 22 10 - 24   Sodium 136 134 - 144 mmol/L   Potassium 5.1 3.5 - 5.2 mmol/L   Chloride 97 96 - 106 mmol/L   CO2 23 20 - 29 mmol/L   Calcium 9.4 8.6 - 10.2 mg/dL   Total Protein 6.9 6.0 - 8.5 g/dL   Albumin 4.6 3.8 - 4.8 g/dL   Globulin, Total 2.3 1.5 - 4.5 g/dL   Albumin/Globulin Ratio 2.0 1.2 - 2.2   Bilirubin Total 0.5 0.0 - 1.2 mg/dL   Alkaline Phosphatase 72 44 - 121 IU/L   AST 16 0 - 40 IU/L   ALT 21 0 - 44 IU/L  TSH  Result Value Ref Range   TSH 1.760 0.450 - 4.500 uIU/mL  Lipid panel  Result Value Ref Range   Cholesterol, Total 154 100 - 199 mg/dL   Triglycerides 96 0 - 149 mg/dL   HDL 58 >39 mg/dL   VLDL Cholesterol Cal 18 5 - 40 mg/dL   LDL Chol Calc (NIH) 78 0 - 99 mg/dL   Chol/HDL Ratio 2.7 0.0 - 5.0 ratio        Current Outpatient Medications:    amLODipine (NORVASC) 5 MG tablet, Take 1 tablet (5 mg total) by mouth daily., Disp: 90 tablet, Rfl: 1   benazepril (LOTENSIN) 40 MG tablet, Take 1 tablet (40 mg total) by mouth daily., Disp: 90 tablet, Rfl: 1   Blood Glucose Calibration (TRUE METRIX LEVEL 3) High SOLN, , Disp: , Rfl:    Blood Glucose Monitoring Suppl (TRUE METRIX METER) w/Device KIT, USE AS DIRECTED, Disp: 1 kit, Rfl: 1   cyanocobalamin 1000 MCG tablet, Take 1,000 mcg by mouth daily., Disp: , Rfl:    DROPLET PEN NEEDLES 32G X 4 MM MISC, USE 2 (TWO) TIMES DAILY AS NEEDED., Disp: 200 each, Rfl: 2   DULoxetine (CYMBALTA) 60 MG capsule, TAKE 1 CAPSULE BY MOUTH EVERY DAY, Disp: 30 capsule, Rfl: 6   empagliflozin (JARDIANCE) 25 MG TABS tablet, Take 1 tablet (25 mg total) by mouth daily., Disp: 90 tablet, Rfl: 1  glucose blood (CONTOUR NEXT TEST) test strip, USE AS DIRECTED TWICE A DAY TO TEST BLOOD SUGAR LEVELS, Disp: 100 each, Rfl: 11    metFORMIN (GLUCOPHAGE) 1000 MG tablet, Take 1 tablet (1,000 mg total) by mouth 2 (two) times daily with a meal., Disp: 180 tablet, Rfl: 1   rosuvastatin (CRESTOR) 40 MG tablet, Take 1 tablet (40 mg total) by mouth daily., Disp: 90 tablet, Rfl: 1   sildenafil (VIAGRA) 100 MG tablet, TAKE 1 TABLET DAILY AS NEEDED FOR ERECTILE DYSFUNCTION, Disp: 6 tablet, Rfl: 11   traZODone (DESYREL) 50 MG tablet, Take 1 tablet (50 mg total) by mouth at bedtime as needed for sleep., Disp: 90 tablet, Rfl: 1   TRESIBA FLEXTOUCH 100 UNIT/ML FlexTouch Pen, INJECT 15 UNITS INTO THE SKIN DAILY., Disp: 9 mL, Rfl: 1   TRUEplus Lancets 28G MISC, , Disp: , Rfl:    zolpidem (AMBIEN) 10 MG tablet, Take 1 tablet (10 mg total) by mouth at bedtime as needed for sleep., Disp: 30 tablet, Rfl: 2    Assessment & Plan:  DM a1c is at 6.6  Is on tresiba 15 units , metformin 1000 mg bid and jardiance 25 mg.  Will need to cut back on tresiba check HbA1c,  urine  microalbumin  diabetic diet plan given to pt  adviced regarding hypoglycemia and instructions given to pt today on how to prevent and treat the same if it were to occur. pt acknowledges the plan and voices understanding of the same.  exercise plan given and encouraged.   advice diabetic yearly podiatry, ophthalmology , nutritionist , dental check q 6 months  2. HTN stable, is on benazapril for such  Continue current meds.  Medication compliance emphasised. pt advised to keep Bp logs. Pt verbalised understanding of the same. Pt to have a low salt diet . Exercise to reach a goal of at least 150 mins a week.  lifestyle modifications explained and pt understands importance of the above.  3> Dm Neuropathy is on cymbatla for such , was off for a month . Helps once back on this, continue  Wil need to fu with podiatry  Problem List Items Addressed This Visit   None    Orders Placed This Encounter  Procedures   Varicella-zoster vaccine IM (Shingrix)   Bayer DCA Hb A1c Waived (STAT)    Ambulatory referral to Podiatry     No orders of the defined types were placed in this encounter.    Follow up plan: No follow-ups on file.  Health Maintenance  PSA :  Cscope : 70 yrs old  Pneumonia vaccine : prenvanar 2019 / pneumovax 2021 shingles vaccine - sp 1st shot FLu vaccine :  Labs next visit : CBC, CMP, FLP, HBA1C, TSH, PSA, urine microalbumin Labs 1 week prior to next visit.

## 2021-08-02 ENCOUNTER — Other Ambulatory Visit: Payer: Self-pay | Admitting: Nurse Practitioner

## 2021-08-02 ENCOUNTER — Other Ambulatory Visit: Payer: Self-pay | Admitting: Family Medicine

## 2021-08-02 DIAGNOSIS — I10 Essential (primary) hypertension: Secondary | ICD-10-CM

## 2021-08-02 DIAGNOSIS — E785 Hyperlipidemia, unspecified: Secondary | ICD-10-CM

## 2021-08-02 DIAGNOSIS — E1149 Type 2 diabetes mellitus with other diabetic neurological complication: Secondary | ICD-10-CM

## 2021-08-03 NOTE — Telephone Encounter (Signed)
Requested Prescriptions  Pending Prescriptions Disp Refills  . rosuvastatin (CRESTOR) 40 MG tablet [Pharmacy Med Name: ROSUVASTATIN CALCIUM 40 MG Tablet] 90 tablet 3    Sig: TAKE 1 TABLET EVERY DAY     Cardiovascular:  Antilipid - Statins Passed - 08/02/2021  6:42 PM      Passed - Total Cholesterol in normal range and within 360 days    Cholesterol, Total  Date Value Ref Range Status  07/23/2021 154 100 - 199 mg/dL Final   Cholesterol Piccolo, Waived  Date Value Ref Range Status  03/23/2017 145 <200 mg/dL Final    Comment:                            Desirable                <200                         Borderline High      200- 239                         High                     >239          Passed - LDL in normal range and within 360 days    LDL Chol Calc (NIH)  Date Value Ref Range Status  07/23/2021 78 0 - 99 mg/dL Final         Passed - HDL in normal range and within 360 days    HDL  Date Value Ref Range Status  07/23/2021 58 >39 mg/dL Final         Passed - Triglycerides in normal range and within 360 days    Triglycerides  Date Value Ref Range Status  07/23/2021 96 0 - 149 mg/dL Final   Triglycerides Piccolo,Waived  Date Value Ref Range Status  03/23/2017 74 <150 mg/dL Final    Comment:                            Normal                   <150                         Borderline High     150 - 199                         High                200 - 499                         Very High                >499          Passed - Patient is not pregnant      Passed - Valid encounter within last 12 months    Recent Outpatient Visits          4 days ago Diabetes mellitus without complication ([place])   [facility] Vigg, Avanti, MD   1 month ago Need for influenza vaccination   [facility] Vigg, Loman [place], MD  5 months ago Hypertension associated with diabetes ([place])   [place], Lauren A, NP   8 months ago Hypertension  associated with diabetes ([place])   [place], Lauren A, NP   1 year ago Hypertension associated with diabetes ([place])   [place], Lilia Argue, [place]      Future Appointments            In 1 month  [place]   In 2 months Vigg, Avanti, MD [facility], [facility]           . benazepril (LOTENSIN) 40 MG tablet [Pharmacy Med Name: BENAZEPRIL HCL 40 MG Tablet] 90 tablet 1    Sig: TAKE 1 TABLET EVERY DAY     Cardiovascular:  ACE Inhibitors Failed - 08/02/2021  6:42 PM      Failed - Cr in normal range and within 180 days    Creatinine, Ser  Date Value Ref Range Status  07/23/2021 0.73 (L) 0.76 - 1.27 mg/dL Final         Passed - K in normal range and within 180 days    Potassium  Date Value Ref Range Status  07/23/2021 5.1 3.5 - 5.2 mmol/L Final         Passed - Patient is not pregnant      Passed - Last BP in normal range    BP Readings from Last 1 Encounters:  07/30/21 111/74         Passed - Valid encounter within last 6 months    Recent Outpatient Visits          4 days ago Diabetes mellitus without complication ([place])   [facility] Vigg, Avanti, MD   1 month ago Need for influenza vaccination   [facility] Vigg, Avanti, MD   5 months ago Hypertension associated with diabetes ([place])   [place], Lauren A, NP   8 months ago Hypertension associated with diabetes ([place])   [place], Lauren A, NP   1 year ago Hypertension associated with diabetes [facility])   [place], Lilia Argue, PA-C      Future Appointments            In 1 month  [place]   In 2 months Vigg, Avanti, MD [employer], [place]

## 2021-09-02 ENCOUNTER — Ambulatory Visit: Payer: Medicare HMO

## 2021-09-02 ENCOUNTER — Ambulatory Visit (INDEPENDENT_AMBULATORY_CARE_PROVIDER_SITE_OTHER): Payer: Medicare HMO | Admitting: *Deleted

## 2021-09-02 DIAGNOSIS — Z0000 Encounter for general adult medical examination without abnormal findings: Secondary | ICD-10-CM

## 2021-09-02 NOTE — Patient Instructions (Signed)
Mark Allen , Thank you for taking time to come for your Medicare Wellness Visit. I appreciate your ongoing commitment to your health goals. Please review the following plan we discussed and let me know if I can assist you in the future.   Screening recommendations/referrals: Colonoscopy: up to date Recommended yearly ophthalmology/optometry visit for glaucoma screening and checkup Recommended yearly dental visit for hygiene and checkup  Vaccinations: Influenza vaccine: up to date Pneumococcal vaccine: up to date Tdap vaccine: up to date Shingles vaccine: up to date    Advanced directives: living will  Conditions/risks identified:   Next appointment: 09-18-2022 @ 9:00 am  [place]  Preventive Care 68 Years and Older, Male Preventive care refers to lifestyle choices and visits with your health care provider that can promote health and wellness. What does preventive care include? A yearly physical exam. This is also called an annual well check. Dental exams once or twice a year. Routine eye exams. Ask your health care provider how often you should have your eyes checked. Personal lifestyle choices, including: Daily care of your teeth and gums. Regular physical activity. Eating a healthy diet. Avoiding tobacco and drug use. Limiting alcohol use. Practicing safe sex. Taking low doses of aspirin every day. Taking vitamin and mineral supplements as recommended by your health care provider. What happens during an annual well check? The services and screenings done by your health care provider during your annual well check will depend on your age, overall health, lifestyle risk factors, and family history of disease. Counseling  Your health care provider may ask you questions about your: Alcohol use. Tobacco use. Drug use. Emotional well-being. Home and relationship well-being. Sexual activity. Eating habits. History of falls. Memory and ability to understand (cognition). Work and  work Statistician. Screening  You may have the following tests or measurements: Height, weight, and BMI. Blood pressure. Lipid and cholesterol levels. These may be checked every 5 years, or more frequently if you are over 59 years old. Skin check. Lung cancer screening. You may have this screening every year starting at age 55 if you have a 30-pack-year history of smoking and currently smoke or have quit within the past 15 years. Fecal occult blood test (FOBT) of the stool. You may have this test every year starting at age 55. Flexible sigmoidoscopy or colonoscopy. You may have a sigmoidoscopy every 5 years or a colonoscopy every 10 years starting at age 15. Prostate cancer screening. Recommendations will vary depending on your family history and other risks. Hepatitis C blood test. Hepatitis B blood test. Sexually transmitted disease (STD) testing. Diabetes screening. This is done by checking your blood sugar (glucose) after you have not eaten for a while (fasting). You may have this done every 1-3 years. Abdominal aortic aneurysm (AAA) screening. You may need this if you are a current or former smoker. Osteoporosis. You may be screened starting at age 47 if you are at high risk. Talk with your health care provider about your test results, treatment options, and if necessary, the need for more tests. Vaccines  Your health care provider may recommend certain vaccines, such as: Influenza vaccine. This is recommended every year. Tetanus, diphtheria, and acellular pertussis (Tdap, Td) vaccine. You may need a Td booster every 10 years. Zoster vaccine. You may need this after age 24. Pneumococcal 13-valent conjugate (PCV13) vaccine. One dose is recommended after age 23. Pneumococcal polysaccharide (PPSV23) vaccine. One dose is recommended after age 24. Talk to your health care provider about which  screenings and vaccines you need and how often you need them. This information is not intended to  replace advice given to you by your health care provider. Make sure you discuss any questions you have with your health care provider. Document Released: 10/12/2015 Document Revised: 06/04/2016 Document Reviewed: 07/17/2015 Elsevier Interactive Patient Education  2017 [place] Prevention in the Home Falls can cause injuries. They can happen to people of all ages. There are many things you can do to make your home safe and to help prevent falls. What can I do on the outside of my home? Regularly fix the edges of walkways and driveways and fix any cracks. Remove anything that might make you trip as you walk through a door, such as a raised step or threshold. Trim any bushes or trees on the path to your home. Use bright outdoor lighting. Clear any walking paths of anything that might make someone trip, such as rocks or tools. Regularly check to see if handrails are loose or broken. Make sure that both sides of any steps have handrails. Any raised decks and porches should have guardrails on the edges. Have any leaves, snow, or ice cleared regularly. Use sand or salt on walking paths during winter. Clean up any spills in your garage right away. This includes oil or grease spills. What can I do in the bathroom? Use night lights. Install grab bars by the toilet and in the tub and shower. Do not use towel bars as grab bars. Use non-skid mats or decals in the tub or shower. If you need to sit down in the shower, use a plastic, non-slip stool. Keep the floor dry. Clean up any water that spills on the floor as soon as it happens. Remove soap buildup in the tub or shower regularly. Attach bath mats securely with double-sided non-slip rug tape. Do not have throw rugs and other things on the floor that can make you trip. What can I do in the bedroom? Use night lights. Make sure that you have a light by your bed that is easy to reach. Do not use any sheets or blankets that are too big for  your bed. They should not hang down onto the floor. Have a firm chair that has side arms. You can use this for support while you get dressed. Do not have throw rugs and other things on the floor that can make you trip. What can I do in the kitchen? Clean up any spills right away. Avoid walking on wet floors. Keep items that you use a lot in easy-to-reach places. If you need to reach something above you, use a strong step stool that has a grab bar. Keep electrical cords out of the way. Do not use floor polish or wax that makes floors slippery. If you must use wax, use non-skid floor wax. Do not have throw rugs and other things on the floor that can make you trip. What can I do with my stairs? Do not leave any items on the stairs. Make sure that there are handrails on both sides of the stairs and use them. Fix handrails that are broken or loose. Make sure that handrails are as long as the stairways. Check any carpeting to make sure that it is firmly attached to the stairs. Fix any carpet that is loose or worn. Avoid having throw rugs at the top or bottom of the stairs. If you do have throw rugs, attach them to the floor with carpet  tape. Make sure that you have a light switch at the top of the stairs and the bottom of the stairs. If you do not have them, ask someone to add them for you. What else can I do to help prevent falls? Wear shoes that: Do not have high heels. Have rubber bottoms. Are comfortable and fit you well. Are closed at the toe. Do not wear sandals. If you use a stepladder: Make sure that it is fully opened. Do not climb a closed stepladder. Make sure that both sides of the stepladder are locked into place. Ask someone to hold it for you, if possible. Clearly mark and make sure that you can see: Any grab bars or handrails. First and last steps. Where the edge of each step is. Use tools that help you move around (mobility aids) if they are needed. These  include: Canes. Walkers. Scooters. Crutches. Turn on the lights when you go into a dark area. Replace any light bulbs as soon as they burn out. Set up your furniture so you have a clear path. Avoid moving your furniture around. If any of your floors are uneven, fix them. If there are any pets around you, be aware of where they are. Review your medicines with your doctor. Some medicines can make you feel dizzy. This can increase your chance of falling. Ask your doctor what other things that you can do to help prevent falls. This information is not intended to replace advice given to you by your health care provider. Make sure you discuss any questions you have with your health care provider. Document Released: 07/12/2009 Document Revised: 02/21/2016 Document Reviewed: 10/20/2014 Elsevier Interactive Patient Education  2017 [employer].

## 2021-09-02 NOTE — Progress Notes (Signed)
Subjective:   Mark Allen is a 70 y.o. male who presents for Medicare Annual/Subsequent preventive examination.  I connected with  Mark Allen on 09/02/21 by a telephone enabled telemedicine application and verified that I am speaking with the correct person using two identifiers.   I discussed the limitations of evaluation and management by telemedicine. The patient expressed understanding and agreed to proceed.  Patient location: home  Provider location: in office    Review of Systems     Cardiac Risk Factors include: advanced age (>69mn, >>72women);diabetes mellitus;male gender;hypertension     Objective:    Today's Vitals   There is no height or weight on file to calculate BMI.  Advanced Directives 09/02/2021 08/31/2020 10/22/2018 07/19/2018 10/16/2016  Does Patient Have a Medical Advance Directive? Yes Yes Yes No No  Type of [place] Directive [place] - -  Copy of [place] Chart? - No - copy requested No - copy requested - -  Would patient like information on creating a medical advance directive? - - - No - Patient declined -    Current Medications (verified) Outpatient Encounter Medications as of 09/02/2021  Medication Sig   amLODipine (NORVASC) 5 MG tablet Take 1 tablet (5 mg total) by mouth daily.   benazepril (LOTENSIN) 40 MG tablet TAKE 1 TABLET EVERY DAY   Blood Glucose Calibration (TRUE METRIX LEVEL 3) High SOLN    Blood Glucose Monitoring Suppl (TRUE METRIX METER) w/Device KIT USE AS DIRECTED   cyanocobalamin 1000 MCG tablet Take 1,000 mcg by mouth daily.   DROPLET PEN NEEDLES 32G X 4 MM MISC USE 2 (TWO) TIMES DAILY AS NEEDED.   DULoxetine (CYMBALTA) 60 MG capsule TAKE 1 CAPSULE BY MOUTH EVERY DAY   empagliflozin (JARDIANCE) 25 MG TABS tablet Take 1 tablet (25 mg total) by mouth daily.   metFORMIN (GLUCOPHAGE) 1000 MG tablet Take 1 tablet (1,000 mg  total) by mouth 2 (two) times daily with a meal.   rosuvastatin (CRESTOR) 40 MG tablet TAKE 1 TABLET EVERY DAY   sildenafil (VIAGRA) 100 MG tablet TAKE 1 TABLET DAILY AS NEEDED FOR ERECTILE DYSFUNCTION   traZODone (DESYREL) 50 MG tablet Take 1 tablet (50 mg total) by mouth at bedtime as needed for sleep.   TRESIBA FLEXTOUCH 100 UNIT/ML FlexTouch Pen INJECT 15 UNITS INTO THE SKIN DAILY.   TRUE METRIX BLOOD GLUCOSE TEST test strip CHECK BLOOD SUGAR TWICE DAILY   TRUEplus Lancets 28G MISC    zolpidem (AMBIEN) 10 MG tablet Take 1 tablet (10 mg total) by mouth at bedtime as needed for sleep.   No facility-administered encounter medications on file as of 09/02/2021.    Allergies (verified) Sulfa antibiotics and Sulfacetamide sodium   History: Past Medical History:  Diagnosis Date   Broken leg 06/2018   Left leg   Calculus of kidney    Hyperlipidemia    Hypertension    Impotence, organic    Insomnia    Testicular hypofunction    Type II diabetes mellitus with neurological manifestations ([facility]    Past Surgical History:  Procedure Laterality Date   COLONOSCOPY WITH PROPOFOL N/A 10/22/2018   Procedure: COLONOSCOPY WITH PROPOFOL;  Surgeon: AJonathon Bellows MD;  [facility];  Service: Gastroenterology;  Laterality: N/A;   NASAL SINUS SURGERY  2004   REFRACTIVE SURGERY Bilateral    SPLENECTOMY  1963   TONSILLECTOMY     Family History  Problem Relation Age of Onset   Cancer Father    Social History   Socioeconomic History   Marital status: Married    Spouse name: Not on file   Number of children: Not on file   Years of education: Not on file   Highest education level: Not on file  Occupational History   Not on file  Tobacco Use   Smoking status: Former    Types: Cigarettes    Quit date: 01/27/1989    Years since quitting: 32.6   Smokeless tobacco: Former    Types: Chew    Quit date: 09/29/1990  Vaping Use   Vaping Use: Never used  Substance and Sexual Activity    Alcohol use: Yes    Alcohol/week: 0.0 standard drinks    Comment: rare,none last 24hrs   Drug use: No   Sexual activity: Yes  Other Topics Concern   Not on file  Social History Narrative   Not on file   Social Determinants of Health   Financial Resource Strain: Low Risk    Difficulty of Paying Living Expenses: Not hard at all  Food Insecurity: No Food Insecurity   Worried About Charity fundraiser in the Last Year: Never true   [place] in the Last Year: Never true  Transportation Needs: No Transportation Needs   Lack of Transportation (Medical): No   Lack of Transportation (Non-Medical): No  Physical Activity: Unknown   Days of Exercise per Week: 5 days   Minutes of Exercise per Session: Not on file  Stress: No Stress Concern Present   Feeling of Stress : Not at all  Social Connections: Moderately Integrated   Frequency of Communication with Friends and Family: More than three times a week   Frequency of Social Gatherings with Friends and Family: More than three times a week   Attends Religious Services: More than 4 times per year   Active Member of [employer] or Organizations: No   Attends Music therapist: Never   Marital Status: Married    Tobacco Counseling Counseling given: Not Answered   Clinical Intake:  Pre-visit preparation completed: No  Pain : No/denies pain     Nutritional Risks: None Diabetes: Yes CBG done?: No Did pt. bring in CBG monitor from home?: No  How often do you need to have someone help you when you read instructions, pamphlets, or other written materials from your doctor or pharmacy?: 1 - Never  Diabetic?  Yes   Nutrition Risk Assessment:  Has the patient had any N/V/D within the last 2 months?  No  Does the patient have any non-healing wounds?  No  Has the patient had any unintentional weight loss or weight gain?  No   Diabetes:  Is the patient diabetic?  Yes  If diabetic, was a CBG obtained today?  No  Did the  patient bring in their glucometer from home?  No  How often do you monitor your CBG's? 2 x daily.   Financial Strains and Diabetes Management:  Are you having any financial strains with the device, your supplies or your medication? No .  Does the patient want to be seen by Chronic Care Management for management of their diabetes?  No  Would the patient like to be referred to a Nutritionist or for Diabetic Management?  No   Diabetic Exams:  Diabetic Eye Exam: Completed 2022. Overdue for diabetic eye exam. Pt has been advised about the importance in completing this exam.  A referral has been placed today. Message sent to referral coordinator for scheduling purposes. Advised pt to expect a call from office referred to regarding appt.  Diabetic Foot Exam:   [place] appointment with podiatrist [phone] . Pt has been advised about the importance in completing this exam.  Interpreter Needed?: No  Information entered by :: Leroy Kennedy LPN   Activities of Daily Living In your present state of health, do you have any difficulty performing the following activities: 09/02/2021  Hearing? Y  Comment left ear  Vision? N  Difficulty concentrating or making decisions? N  Walking or climbing stairs? N  Dressing or bathing? N  Doing errands, shopping? N  Preparing Food and eating ? N  Using the Toilet? N  In the past six months, have you accidently leaked urine? N  Do you have problems with loss of bowel control? N  Managing your Medications? N  Managing your Finances? N  Housekeeping or managing your Housekeeping? N  Some recent data might be hidden    Patient Care Team: Charlynne Cousins, MD as PCP - General Vladimir Faster, [facility] (Pharmacist) Vladimir Faster, [facility] (Pharmacist)  Indicate any recent Medical Services you may have received from other than [facility] providers in the past year (date may be approximate).     Assessment:   This is a routine wellness examination for Hemi.  Hearing/Vision  screen Hearing Screening - Comments:: Left ear hearing loss Does not wear hearing aids. Vision Screening - Comments:: [place] Up to date  Dietary issues and exercise activities discussed: Current Exercise Habits: The patient has a physically strenuous job, but has no regular exercise apart from work., Frequency (Times/Week): 5, Intensity: Moderate, Exercise limited by: None identified   Goals Addressed             This Visit's Progress    Patient Stated       Would like to get back to the gym       Depression Screen PHQ 2/9 Scores 09/02/2021 07/30/2021 02/28/2021 08/31/2020 02/09/2020 09/15/2018 03/15/2018  PHQ - 2 Score 0 0 0 0 0 0 0  PHQ- 9 Score - 0 - - - 3 -    Fall Risk Fall Risk  09/02/2021 07/30/2021 02/28/2021 08/31/2020 05/25/2020  Falls in the past year? 0 0 0 0 0  Number falls in past yr: 0 0 0 - 0  Injury with Fall? 0 0 0 - 0  Risk Factor Category  - - - - -  Risk for fall due to : - No Fall Risks No Fall Risks Medication side effect -  Follow up Falls evaluation completed;Falls prevention discussed Falls evaluation completed - Falls evaluation completed;Education provided;Falls prevention discussed -    FALL RISK PREVENTION PERTAINING TO THE HOME:  Any stairs in or around the home? Yes  If so, are there any without handrails? No  Home free of loose throw rugs in walkways, pet beds, electrical cords, etc? Yes  Adequate lighting in your home to reduce risk of falls? Yes   ASSISTIVE DEVICES UTILIZED TO PREVENT FALLS:  Life alert? No  Use of a cane, walker or w/c? No  Grab bars in the bathroom? No  Shower chair or bench in shower? Yes  Elevated toilet seat or a handicapped toilet? Yes   TIMED UP AND GO:  Was the test performed? No .    Cognitive Function:  Normal cognitive status assessed by direct observation by this Nurse Health Advisor. No  abnormalities found.       6CIT Screen 08/31/2020  What Year? 0 points  What month? 0 points  What time?  0 points  Count back from 20 0 points  Months in reverse 0 points  Repeat phrase 0 points  Total Score 0    Immunizations Immunization History  Administered Date(s) Administered   Fluad Quad(high Dose 65+) 06/12/2020, 06/18/2021   Influenza, High Dose Seasonal PF 06/16/2018, 07/01/2019   Influenza,inj,Quad PF,6+ Mos 07/06/2015, 06/25/2016, 06/23/2017   Influenza-Unspecified 08/17/2014, 06/22/2019   PFIZER(Purple Top)SARS-COV-2 Vaccination 12/12/2019, 01/02/2020   Pneumococcal Conjugate-13 03/15/2018   Pneumococcal Polysaccharide-23 06/12/2020   Pneumococcal-Unspecified 07/10/2008   Td 07/17/2009, 05/25/2020   Zoster Recombinat (Shingrix) 07/30/2021   Zoster, Live 08/10/2012    TDAP status: Up to date  Flu Vaccine status: Up to date  Pneumococcal vaccine status: Up to date  Covid-19 vaccine status: Information provided on how to obtain vaccines.   Qualifies for Shingles Vaccine? Yes   Zostavax completed Yes     has had1 of 2 (07-30-2021) Shingrix Completed?: No.    Education has been provided regarding the importance of this vaccine. Patient has been advised to call insurance company to determine out of pocket expense if they have not yet received this vaccine. Advised may also receive vaccine at local pharmacy or Health Dept. Verbalized acceptance and understanding.  Screening Tests Health Maintenance  Topic Date Due   COVID-19 Vaccine (3 - Pfizer risk series) 01/30/2020   OPHTHALMOLOGY EXAM  09/18/2021   Zoster Vaccines- Shingrix (2 of 2) 09/24/2021   FOOT EXAM  11/26/2021   HEMOGLOBIN A1C  01/27/2022   COLONOSCOPY (Pts 45-86yr Insurance coverage will need to be confirmed)  10/23/2023   TETANUS/TDAP  05/25/2030   Pneumonia Vaccine 68 Years old  Completed   INFLUENZA VACCINE  Completed   Hepatitis C Screening  Completed   HPV VACCINES  Aged Out    Health Maintenance  Health Maintenance Due  Topic Date Due   COVID-19 Vaccine (3 - Pfizer risk series) 01/30/2020     Colorectal cancer screening: Type of screening: Colonoscopy. Completed 2020. Repeat every 5 years  Lung Cancer Screening: (Low Dose CT Chest recommended if Age 68-80years, 30 pack-year currently smoking OR have quit w/in 15years.) does not qualify.   Lung Cancer Screening Referral:   Additional Screening:  Hepatitis C Screening: does not qualify; Completed 2016  Vision Screening: Recommended annual ophthalmology exams for early detection of glaucoma and other disorders of the eye. Is the patient up to date with their annual eye exam?  Yes  Who is the provider or what is the name of the [facility] which the patient [facility]ends annual [facility] exams[facility] provider, would they like to be referred to a provider to establish care? No .   Dental Screening: Recommended annual dental exams for proper oral hygiene  Community Resource Referral / Chronic Care Management: CRR required this visit?  No   CCM required this visit?  No      Plan:     I have personally reviewed and noted the following in the patient's chart:   Medical and social history Use of alcohol, tobacco or illicit drugs  Current medications and supplements including opioid prescriptions. Patient is not currently taking opioid prescriptions. Functional ability and status Nutritional status Physical activity Advanced directives List of other physicians Hospitalizations, surgeries, and ER visits in previous 12 months Vitals Screenings to include cognitive,  depression, and falls Referrals and appointments  In addition, I have reviewed and discussed with patient certain preventive protocols, quality metrics, and best practice recommendations. A written personalized care plan for preventive services as well as general preventive health recommendations were provided to patient.     Leroy Kennedy, LPN   [id number]   Nurse Notes: Patient states he is taking his blood sugar in  morning it is running between 180-188.    He is going to monitor it in morning and evening for a couple days will send a message with readings.  He was recently taken off of trulicity.

## 2021-09-11 ENCOUNTER — Encounter: Payer: Self-pay | Admitting: Internal Medicine

## 2021-09-11 ENCOUNTER — Ambulatory Visit (INDEPENDENT_AMBULATORY_CARE_PROVIDER_SITE_OTHER): Payer: Medicare HMO | Admitting: Internal Medicine

## 2021-09-11 ENCOUNTER — Other Ambulatory Visit: Payer: Self-pay

## 2021-09-11 VITALS — BP 116/72 | HR 73 | Temp 98.4°F | Ht 69.69 in | Wt 205.2 lb

## 2021-09-11 DIAGNOSIS — E119 Type 2 diabetes mellitus without complications: Secondary | ICD-10-CM | POA: Diagnosis not present

## 2021-09-11 DIAGNOSIS — G4700 Insomnia, unspecified: Secondary | ICD-10-CM

## 2021-09-11 MED ORDER — TRAZODONE HCL 50 MG PO TABS: 50.0000 mg | ORAL_TABLET | Freq: Every evening | ORAL | 1 refills | Status: DC | PRN

## 2021-09-11 MED ORDER — TRULICITY 0.75 MG/0.5ML ~~LOC~~ SOAJ: 0.7500 mg | SUBCUTANEOUS | 5 refills | Status: DC

## 2021-09-11 MED ORDER — TRULICITY 0.75 MG/0.5ML ~~LOC~~ SOAJ: 0.7500 mg | SUBCUTANEOUS | 0 refills | Status: DC

## 2021-09-11 NOTE — Progress Notes (Signed)
BP 116/72    Pulse 73    Temp 98.4 F (36.9 C) (Oral)    Ht 5' 9.69" (1.77 m)    Wt 205 lb 3.2 oz (93.1 kg)    SpO2 96%    BMI 29.71 kg/m    Subjective:    Patient ID: Mark Allen, male    DOB: [birth year], 70 y.o.   MRN: [id number]  Chief Complaint  Patient presents with   Diabetes    Follow up on decrease in medication    HPI: Mark Allen is a 70 y.o. male  Diabetes He presents for his follow-up (a1c 6.6 FSBS anywhere between - 180 tresiba stopped x 2 weeks , is on jardiacne / metformin) diabetic visit. He has type 2 diabetes mellitus.   Chief Complaint  Patient presents with   Diabetes    Follow up on decrease in medication    Relevant past medical, surgical, family and social history reviewed and updated as indicated. Interim medical history since our last visit reviewed. Allergies and medications reviewed and updated.  Review of Systems  Per HPI unless specifically indicated above     Objective:    BP 116/72    Pulse 73    Temp 98.4 F (36.9 C) (Oral)    Ht 5' 9.69" (1.77 m)    Wt 205 lb 3.2 oz (93.1 kg)    SpO2 96%    BMI 29.71 kg/m   Wt Readings from Last 3 Encounters:  09/11/21 205 lb 3.2 oz (93.1 kg)  07/30/21 205 lb 3.2 oz (93.1 kg)  06/18/21 203 lb 6.4 oz (92.3 kg)    Physical Exam  Results for orders placed or performed in visit on 07/30/21  Bayer DCA Hb A1c Waived (STAT)  Result Value Ref Range   HB A1C (BAYER DCA - WAIVED) 6.6 (H) 4.8 - 5.6 %        Current Outpatient Medications:    Dulaglutide (TRULICITY) 2.68 TM/1.9QQ SOPN, Inject 0.75 mg into the skin once a week. 1 week supply please, Disp: 0.5 mL, Rfl: 0   Dulaglutide (TRULICITY) 2.29 NL/8.9QJ SOPN, Inject 0.75 mg into the skin once a week., Disp: 0.5 mL, Rfl: 5   amLODipine (NORVASC) 5 MG tablet, Take 1 tablet (5 mg total) by mouth daily., Disp: 90 tablet, Rfl: 1   benazepril (LOTENSIN) 40 MG tablet, TAKE 1 TABLET EVERY DAY, Disp: 90 tablet, Rfl: 1   Blood Glucose Calibration (TRUE  METRIX LEVEL 3) High SOLN, , Disp: , Rfl:    Blood Glucose Monitoring Suppl (TRUE METRIX METER) w/Device KIT, USE AS DIRECTED, Disp: 1 kit, Rfl: 1   cyanocobalamin 1000 MCG tablet, Take 1,000 mcg by mouth daily., Disp: , Rfl:    DROPLET PEN NEEDLES 32G X 4 MM MISC, USE 2 (TWO) TIMES DAILY AS NEEDED., Disp: 200 each, Rfl: 2   DULoxetine (CYMBALTA) 60 MG capsule, TAKE 1 CAPSULE BY MOUTH EVERY DAY, Disp: 30 capsule, Rfl: 6   empagliflozin (JARDIANCE) 25 MG TABS tablet, Take 1 tablet (25 mg total) by mouth daily., Disp: 90 tablet, Rfl: 1   metFORMIN (GLUCOPHAGE) 1000 MG tablet, Take 1 tablet (1,000 mg total) by mouth 2 (two) times daily with a meal., Disp: 180 tablet, Rfl: 1   rosuvastatin (CRESTOR) 40 MG tablet, TAKE 1 TABLET EVERY DAY, Disp: 90 tablet, Rfl: 3   sildenafil (VIAGRA) 100 MG tablet, TAKE 1 TABLET DAILY AS NEEDED FOR ERECTILE DYSFUNCTION, Disp: 6 tablet, Rfl: 11   traZODone (  DESYREL) 50 MG tablet, Take 1 tablet (50 mg total) by mouth at bedtime as needed for sleep., Disp: 30 tablet, Rfl: 1   TRUE METRIX BLOOD GLUCOSE TEST test strip, CHECK BLOOD SUGAR TWICE DAILY, Disp: 200 strip, Rfl: 1   TRUEplus Lancets 28G MISC, , Disp: , Rfl:     Assessment & Plan:  DM : Add trulicity 7.67 once a week Rtc x 4 - 6 weeks for recheck of bw with bl sugar logs, pt vies to keep blood sugar logs fasting and postprandial as well to alternate between these 2 timings. Continue metformin 2000 mg daily and Jardiance 25 mg daily. Off Tyler Aas DC such A1c at goal at 6.6 however fasting blood sugars have been rising and per patient's verbal record highest has been around 180 since stopping insulin completely  2 insomnia is on trazadone continue only as needed. Sleep hygiene explained to pt  Problem List Items Addressed This Visit       Endocrine   Diabetes mellitus without complication ([place]) - Primary   Relevant Medications   Dulaglutide (TRULICITY) 0.11 YY/3.4JY SOPN   Dulaglutide (TRULICITY) 1.16  IH/5.3PN SOPN   Other Relevant Orders   Bayer DCA Hb A1c Waived (STAT)   Comprehensive metabolic panel   Lipid panel     Other   Insomnia   Relevant Medications   traZODone (DESYREL) 50 MG tablet     Orders Placed This Encounter  Procedures   Bayer DCA Hb A1c Waived (STAT)   Comprehensive metabolic panel   Lipid panel     Meds ordered this encounter  Medications   Dulaglutide (TRULICITY) 2.25 YT/4.6IT SOPN    Sig: Inject 0.75 mg into the skin once a week. 1 week supply please    Dispense:  0.5 mL    Refill:  0   Dulaglutide (TRULICITY) 9.47 XG/5.2VH SOPN    Sig: Inject 0.75 mg into the skin once a week.    Dispense:  0.5 mL    Refill:  5   traZODone (DESYREL) 50 MG tablet    Sig: Take 1 tablet (50 mg total) by mouth at bedtime as needed for sleep.    Dispense:  30 tablet    Refill:  1     Follow up plan: Return in about 4 weeks (around 10/09/2021).

## 2021-09-18 ENCOUNTER — Ambulatory Visit: Payer: Medicare HMO | Admitting: Internal Medicine

## 2021-09-19 DIAGNOSIS — E119 Type 2 diabetes mellitus without complications: Secondary | ICD-10-CM | POA: Diagnosis not present

## 2021-09-19 DIAGNOSIS — H524 Presbyopia: Secondary | ICD-10-CM | POA: Diagnosis not present

## 2021-09-19 LAB — HM DIABETES EYE EXAM

## 2021-10-02 ENCOUNTER — Other Ambulatory Visit: Payer: Self-pay | Admitting: Nurse Practitioner

## 2021-10-02 ENCOUNTER — Other Ambulatory Visit: Payer: Medicare HMO

## 2021-10-02 ENCOUNTER — Other Ambulatory Visit: Payer: Self-pay

## 2021-10-02 DIAGNOSIS — E119 Type 2 diabetes mellitus without complications: Secondary | ICD-10-CM

## 2021-10-02 DIAGNOSIS — I10 Essential (primary) hypertension: Secondary | ICD-10-CM

## 2021-10-02 NOTE — Telephone Encounter (Signed)
Requested Prescriptions  Pending Prescriptions Disp Refills   amLODipine (NORVASC) 5 MG tablet [Pharmacy Med Name: AMLODIPINE BESYLATE 5 MG Tablet] 90 tablet 1    Sig: TAKE 1 TABLET EVERY DAY     Cardiovascular:  Calcium Channel Blockers Passed - 10/02/2021  8:39 AM      Passed - Last BP in normal range    BP Readings from Last 1 Encounters:  09/11/21 116/72         Passed - Valid encounter within last 6 months    Recent Outpatient Visits          3 weeks ago Diabetes mellitus without complication ([place])   [facility] Vigg, Avanti, MD   2 months ago Diabetes mellitus without complication ([place])   [facility] Vigg, Avanti, MD   3 months ago Need for influenza vaccination   [facility] Vigg, Avanti, MD   7 months ago Hypertension associated with diabetes ([place])   [place], Lauren A, NP   10 months ago Hypertension associated with diabetes ([place])   [place], Lauren A, NP      Future Appointments            In 1 week Vigg, Avanti, MD [place]   In 3 weeks Vigg, Avanti, MD [place]            metFORMIN (GLUCOPHAGE) 1000 MG tablet [Pharmacy Med Name: METFORMIN HYDROCHLORIDE 1000 MG Tablet] 180 tablet 1    Sig: TAKE 1 TABLET TWICE DAILY WITH MEALS     Endocrinology:  Diabetes - Biguanides Failed - 10/02/2021  8:39 AM      Failed - Cr in normal range and within 360 days    Creatinine, Ser  Date Value Ref Range Status  07/23/2021 0.73 (L) 0.76 - 1.27 mg/dL Final         Passed - HBA1C is between 0 and 7.9 and within 180 days    Hemoglobin A1C  Date Value Ref Range Status  05/22/2016 7.6  Final   HB A1C (BAYER DCA - WAIVED)  Date Value Ref Range Status  07/30/2021 6.6 (H) 4.8 - 5.6 % Final    Comment:             Prediabetes: 5.7 - 6.4          Diabetes: >6.4          Glycemic control for adults with diabetes: <7.0          Passed - eGFR in  normal range and within 360 days    GFR calc Af Amer  Date Value Ref Range Status  05/25/2020 107 >59 mL/min/1.73 Final    Comment:    **[facility] currently reports eGFR in compliance with the current**   recommendations of the [employer]. [facility] will   update reporting as new guidelines are published from the NKF-ASN   Task force.    GFR calc non Af Amer  Date Value Ref Range Status  05/25/2020 92 >59 mL/min/1.73 Final   eGFR  Date Value Ref Range Status  07/23/2021 99 >59 mL/min/1.73 Final         Passed - Valid encounter within last 6 months    Recent Outpatient Visits          3 weeks ago Diabetes mellitus without complication ([place])   [facility] Vigg, Avanti, MD   2 months ago Diabetes mellitus without complication ([place])  [place] Vigg, Avanti, MD   3 months ago Need for influenza vaccination   [facility] Vigg, Avanti, MD   7 months ago Hypertension associated with diabetes ([place])   [place], Lauren A, NP   10 months ago Hypertension associated with diabetes ([place])   [place], Lauren A, NP      Future Appointments            In 1 week Vigg, Avanti, MD [place]   In 3 weeks Vigg, Avanti, MD [facility], [facility]

## 2021-10-03 ENCOUNTER — Ambulatory Visit (INDEPENDENT_AMBULATORY_CARE_PROVIDER_SITE_OTHER): Payer: Medicare HMO | Admitting: Podiatry

## 2021-10-03 ENCOUNTER — Encounter: Payer: Self-pay | Admitting: Podiatry

## 2021-10-03 DIAGNOSIS — M79674 Pain in right toe(s): Secondary | ICD-10-CM

## 2021-10-03 DIAGNOSIS — E1369 Other specified diabetes mellitus with other specified complication: Secondary | ICD-10-CM | POA: Diagnosis not present

## 2021-10-03 DIAGNOSIS — B351 Tinea unguium: Secondary | ICD-10-CM | POA: Insufficient documentation

## 2021-10-03 DIAGNOSIS — M79675 Pain in left toe(s): Secondary | ICD-10-CM

## 2021-10-03 LAB — LIPID PANEL
Chol/HDL Ratio: 2.6 ratio (ref 0.0–5.0)
Cholesterol, Total: 136 mg/dL (ref 100–199)
HDL: 52 mg/dL (ref >39)
LDL Chol Calc (NIH): 62 mg/dL (ref 0–99)
Triglycerides: 124 mg/dL (ref 0–149)
VLDL Cholesterol Cal: 22 mg/dL (ref 5–40)

## 2021-10-03 NOTE — Progress Notes (Signed)
This patient presents  to my office for at risk foot care.  This patient requires this care by a professional since this patient will be at risk due to having diabetes.This patient is unable to cut nails himself since the patient cannot reach his nails.These nails are painful walking and wearing shoes.  He also presents for diabetic foot exam. This patient presents for at risk foot care today.  General Appearance  Alert, conversant and in no acute stress.  Vascular  Dorsalis pedis and posterior tibial  pulses are palpable  bilaterally.  Capillary return is within normal limits  bilaterally. Temperature is within normal limits  bilaterally.  Neurologic  Senn-Weinstein monofilament wire test within normal limits  bilaterally. Muscle power within normal limits bilaterally.  Nails Thick disfigured discolored nails with subungual debris  hallux nails bilaterally. No evidence of bacterial infection or drainage bilaterally.  Orthopedic  No limitations of motion  feet .  No crepitus or effusions noted.  No bony pathology or digital deformities noted.  Skin  normotropic skin with no porokeratosis noted bilaterally.  No signs of infections or ulcers noted.     Onychomycosis  Pain in right toes  Pain in left toes  Consent was obtained for treatment procedures.   Mechanical debridement of nails 1-5  bilaterally performed with a nail nipper.  Filed with dremel without incident.  No evidence of vascular or neurologic pathology.   Return office visit  6 months                   Told patient to return for periodic foot care and evaluation due to potential at risk complications.   Gardiner Barefoot DPM

## 2021-10-09 ENCOUNTER — Other Ambulatory Visit: Payer: Self-pay

## 2021-10-09 ENCOUNTER — Encounter: Payer: Self-pay | Admitting: Internal Medicine

## 2021-10-09 ENCOUNTER — Ambulatory Visit (INDEPENDENT_AMBULATORY_CARE_PROVIDER_SITE_OTHER): Payer: Medicare HMO | Admitting: Internal Medicine

## 2021-10-09 VITALS — BP 115/73 | HR 59 | Temp 97.9°F | Ht 69.69 in | Wt 204.8 lb

## 2021-10-09 DIAGNOSIS — H9312 Tinnitus, left ear: Secondary | ICD-10-CM | POA: Diagnosis not present

## 2021-10-09 DIAGNOSIS — E119 Type 2 diabetes mellitus without complications: Secondary | ICD-10-CM

## 2021-10-09 DIAGNOSIS — I10 Essential (primary) hypertension: Secondary | ICD-10-CM

## 2021-10-09 LAB — MICROALBUMIN, URINE WAIVED
Creatinine, Urine Waived: 50 mg/dL (ref 10–300)
Microalb, Ur Waived: 30 mg/L — ABNORMAL HIGH (ref 0–19)

## 2021-10-09 LAB — CBC WITH DIFFERENTIAL/PLATELET
Hematocrit: 45.8 % (ref 37.5–51.0)
Hemoglobin: 15.6 g/dL (ref 13.0–17.7)
Lymphocytes Absolute: 2 10*3/uL (ref 0.7–3.1)
Lymphs: 26 %
MCH: 31.4 pg (ref 26.6–33.0)
MCHC: 34.1 g/dL (ref 31.5–35.7)
MCV: 92 fL (ref 79–97)
MID (Absolute): 0.7 10*3/uL (ref 0.1–1.6)
MID: 9 %
Neutrophils Absolute: 4.8 10*3/uL (ref 1.4–7.0)
Neutrophils: 65 %
Platelets: 350 10*3/uL (ref 150–450)
RBC: 4.97 x10E6/uL (ref 4.14–5.80)
RDW: 13.2 % (ref 11.6–15.4)
WBC: 7.5 10*3/uL (ref 3.4–10.8)

## 2021-10-09 LAB — BAYER DCA HB A1C WAIVED: HB A1C (BAYER DCA - WAIVED): 7 % — ABNORMAL HIGH (ref 4.8–5.6)

## 2021-10-09 MED ORDER — BENAZEPRIL HCL 20 MG PO TABS: 20.0000 mg | ORAL_TABLET | Freq: Every day | ORAL | 3 refills | Status: DC

## 2021-10-09 NOTE — Progress Notes (Signed)
BP 115/73    Pulse (!) 59    Temp 97.9 F (36.6 C) (Oral)    Ht 5' 9.69" (1.77 m)    Wt 204 lb 12.8 oz (92.9 kg)    SpO2 94%    BMI 29.65 kg/m    Subjective:    Patient ID: Mark Allen, male    DOB: 1950/10/30, 71 y.o.   MRN: [id number]  Chief Complaint  Patient presents with   Diabetes    Started Trulicity at last visit here for f/u   Tinnitus    Patient states that he is having a huge ringing in his left ear that started about 2 weeks ago    HPI: Mark Allen is a 71 y.o. male  Left ear tinnitis, some hearing loss In left ear.   Diabetes He presents for his follow-up (fsbs - at 130 - 140) diabetic visit. He has type 2 diabetes mellitus. His disease course has been improving. Pertinent negatives for diabetes include no blurred vision.  Hypertension This is a chronic (low normal today) problem. Pertinent negatives include no blurred vision.   Chief Complaint  Patient presents with   Diabetes    Started Trulicity at last visit here for f/u   Tinnitus    Patient states that he is having a huge ringing in his left ear that started about 2 weeks ago    Relevant past medical, surgical, family and social history reviewed and updated as indicated. Interim medical history since our last visit reviewed. Allergies and medications reviewed and updated.  Review of Systems  Eyes:  Negative for blurred vision.   Per HPI unless specifically indicated above     Objective:    BP 115/73    Pulse (!) 59    Temp 97.9 F (36.6 C) (Oral)    Ht 5' 9.69" (1.77 m)    Wt 204 lb 12.8 oz (92.9 kg)    SpO2 94%    BMI 29.65 kg/m   Wt Readings from Last 3 Encounters:  10/09/21 204 lb 12.8 oz (92.9 kg)  09/11/21 205 lb 3.2 oz (93.1 kg)  07/30/21 205 lb 3.2 oz (93.1 kg)    Physical Exam Vitals and nursing note reviewed.  Constitutional:      General: He is not in acute distress.    Appearance: Normal appearance. He is not ill-appearing or diaphoretic.  HENT:     Head:  Normocephalic and atraumatic.     Right Ear: Tympanic membrane and external ear normal. There is no impacted cerumen.     Left Ear: External ear normal.     Nose: No congestion or rhinorrhea.     Mouth/Throat:     Pharynx: No oropharyngeal exudate or posterior oropharyngeal erythema.  Eyes:     Conjunctiva/sclera: Conjunctivae normal.     Pupils: Pupils are equal, round, and reactive to light.  Cardiovascular:     Rate and Rhythm: Normal rate and regular rhythm.     Heart sounds: No murmur heard.   No friction rub. No gallop.  Pulmonary:     Effort: No respiratory distress.     Breath sounds: No stridor. No wheezing or rhonchi.  Chest:     Chest wall: No tenderness.  Abdominal:     General: Abdomen is flat. Bowel sounds are normal.     Palpations: Abdomen is soft. There is no mass.     Tenderness: There is no abdominal tenderness.  Musculoskeletal:     Cervical back:  Normal range of motion and neck supple. No rigidity or tenderness.     Left lower leg: No edema.  Skin:    General: Skin is warm and dry.  Neurological:     Mental Status: He is alert.     Cranial Nerves: No cranial nerve deficit.  Psychiatric:        Mood and Affect: Mood normal.        Behavior: Behavior normal.    Results for orders placed or performed in visit on 10/02/21  Lipid panel  Result Value Ref Range   Cholesterol, Total 136 100 - 199 mg/dL   Triglycerides 124 0 - 149 mg/dL   HDL 52 >39 mg/dL   VLDL Cholesterol Cal 22 5 - 40 mg/dL   LDL Chol Calc (NIH) 62 0 - 99 mg/dL   Chol/HDL Ratio 2.6 0.0 - 5.0 ratio        Current Outpatient Medications:    amLODipine (NORVASC) 5 MG tablet, TAKE 1 TABLET EVERY DAY, Disp: 90 tablet, Rfl: 1   benazepril (LOTENSIN) 40 MG tablet, TAKE 1 TABLET EVERY DAY, Disp: 90 tablet, Rfl: 1   Blood Glucose Calibration (TRUE METRIX LEVEL 3) High SOLN, , Disp: , Rfl:    Blood Glucose Monitoring Suppl (TRUE METRIX METER) w/Device KIT, USE AS DIRECTED, Disp: 1 kit,  Rfl: 1   cyanocobalamin 1000 MCG tablet, Take 1,000 mcg by mouth daily., Disp: , Rfl:    DROPLET PEN NEEDLES 32G X 4 MM MISC, USE 2 (TWO) TIMES DAILY AS NEEDED., Disp: 200 each, Rfl: 2   Dulaglutide (TRULICITY) 6.29 UT/6.5YY SOPN, Inject 0.75 mg into the skin once a week. 1 week supply please, Disp: 0.5 mL, Rfl: 0   Dulaglutide (TRULICITY) 5.03 TW/6.5KC SOPN, Inject 0.75 mg into the skin once a week., Disp: 0.5 mL, Rfl: 5   DULoxetine (CYMBALTA) 60 MG capsule, TAKE 1 CAPSULE BY MOUTH EVERY DAY, Disp: 30 capsule, Rfl: 6   empagliflozin (JARDIANCE) 25 MG TABS tablet, Take 1 tablet (25 mg total) by mouth daily., Disp: 90 tablet, Rfl: 1   metFORMIN (GLUCOPHAGE) 1000 MG tablet, TAKE 1 TABLET TWICE DAILY WITH MEALS, Disp: 180 tablet, Rfl: 1   rosuvastatin (CRESTOR) 40 MG tablet, TAKE 1 TABLET EVERY DAY, Disp: 90 tablet, Rfl: 3   sildenafil (VIAGRA) 100 MG tablet, TAKE 1 TABLET DAILY AS NEEDED FOR ERECTILE DYSFUNCTION, Disp: 6 tablet, Rfl: 11   traZODone (DESYREL) 50 MG tablet, Take 1 tablet (50 mg total) by mouth at bedtime as needed for sleep., Disp: 30 tablet, Rfl: 1   TRUE METRIX BLOOD GLUCOSE TEST test strip, CHECK BLOOD SUGAR TWICE DAILY, Disp: 200 strip, Rfl: 1   TRUEplus Lancets 28G MISC, , Disp: , Rfl:     Assessment & Plan:  DM : Add trulicity 1.27 once a week stopped tresiba and a1c much improved Continue metformin 2000 mg daily and Jardiance 25 mg daily. Off Tyler Aas DC such A1c at goal at 6.6 however fasting blood sugars have been rising and per patient's verbal record highest has been around 180 since stopping insulin completely   2 insomnia is on trazadone continue only as needed. Sleep hygiene explained to pt   3. Tinnitus in left ear worsening, has had this in the past however over last 2 weeks getting worse.  Will refer to ENT for such  ? [place]  Will check CBC for anemia   4. Hypotension will reduce dose of benazepril to 20 mg daily. Continue norvasc 5 mg  Follow  up plan: No follow-ups on file.

## 2021-10-10 LAB — CBC WITH DIFFERENTIAL/PLATELET
Basophils Absolute: 0 10*3/uL (ref 0.0–0.2)
Basos: 0 %
EOS (ABSOLUTE): 0.1 10*3/uL (ref 0.0–0.4)
Eos: 1 %
Hematocrit: 45.9 % (ref 37.5–51.0)
Hemoglobin: 15.7 g/dL (ref 13.0–17.7)
Immature Grans (Abs): 0 10*3/uL (ref 0.0–0.1)
Immature Granulocytes: 0 %
Lymphocytes Absolute: 1.9 10*3/uL (ref 0.7–3.1)
Lymphs: 26 %
MCH: 32 pg (ref 26.6–33.0)
MCHC: 34.2 g/dL (ref 31.5–35.7)
MCV: 94 fL (ref 79–97)
Monocytes Absolute: 0.7 10*3/uL (ref 0.1–0.9)
Monocytes: 11 %
Neutrophils Absolute: 4.3 10*3/uL (ref 1.4–7.0)
Neutrophils: 62 %
Platelets: 356 10*3/uL (ref 150–450)
RBC: 4.9 x10E6/uL (ref 4.14–5.80)
RDW: 12 % (ref 11.6–15.4)
WBC: 7.1 10*3/uL (ref 3.4–10.8)

## 2021-10-16 ENCOUNTER — Other Ambulatory Visit: Payer: Self-pay | Admitting: Family Medicine

## 2021-10-16 ENCOUNTER — Other Ambulatory Visit: Payer: Self-pay | Admitting: Internal Medicine

## 2021-10-16 DIAGNOSIS — G4700 Insomnia, unspecified: Secondary | ICD-10-CM

## 2021-10-16 NOTE — Telephone Encounter (Signed)
Requested Prescriptions  Pending Prescriptions Disp Refills   TRULICITY 5.44 BE/0.1EO SOPN [Pharmacy Med Name: TRULICITY 7.12 RF/7.5OI Solution Pen-injector] 0.5 mL 11    Sig: INJECT 0.75 MG INTO THE SKIN ONCE A WEEK.     Endocrinology:  Diabetes - GLP-1 Receptor Agonists Passed - 10/16/2021  2:41 AM      Passed - HBA1C is between 0 and 7.9 and within 180 days    Hemoglobin A1C  Date Value Ref Range Status  05/22/2016 7.6  Final   HB A1C (BAYER DCA - WAIVED)  Date Value Ref Range Status  10/09/2021 7.0 (H) 4.8 - 5.6 % Final    Comment:             Prediabetes: 5.7 - 6.4          Diabetes: >6.4          Glycemic control for adults with diabetes: <7.0          Passed - Valid encounter within last 6 months    Recent Outpatient Visits          1 week ago Diabetes mellitus without complication ([place])   [facility] Vigg, Avanti, MD   1 month ago Diabetes mellitus without complication ([place])   [facility] Vigg, Avanti, MD   2 months ago Diabetes mellitus without complication ([place])   [facility] Vigg, Avanti, MD   4 months ago Need for influenza vaccination   [facility] Vigg, Avanti, MD   7 months ago Hypertension associated with diabetes ([place])   [place], Lauren A, NP      Future Appointments            In 1 month Vigg, Avanti, MD [facility]

## 2021-10-16 NOTE — Telephone Encounter (Signed)
Requested Prescriptions  Pending Prescriptions Disp Refills   traZODone (DESYREL) 50 MG tablet [Pharmacy Med Name: TRAZODONE HYDROCHLORIDE 50 MG Tablet] 90 tablet 0    Sig: TAKE 1 TABLET (50 MG TOTAL) BY MOUTH AT BEDTIME AS NEEDED FOR SLEEP.     Psychiatry: Antidepressants - Serotonin Modulator Passed - 10/16/2021  2:41 AM      Passed - Valid encounter within last 6 months    Recent Outpatient Visits          1 week ago Diabetes mellitus without complication ([place])   [facility] Vigg, Avanti, MD   1 month ago Diabetes mellitus without complication ([place])   [facility] Vigg, Avanti, MD   2 months ago Diabetes mellitus without complication ([place])   [facility] Vigg, Avanti, MD   4 months ago Need for influenza vaccination   [facility] Vigg, Avanti, MD   7 months ago Hypertension associated with diabetes ([place])   [place], Lauren A, NP      Future Appointments            In 1 month Vigg, Avanti, MD [facility]

## 2021-10-21 ENCOUNTER — Other Ambulatory Visit: Payer: Medicare HMO

## 2021-10-28 ENCOUNTER — Encounter: Payer: Medicare HMO | Admitting: Internal Medicine

## 2021-10-28 DIAGNOSIS — H2511 Age-related nuclear cataract, right eye: Secondary | ICD-10-CM | POA: Diagnosis not present

## 2021-10-28 DIAGNOSIS — Z961 Presence of intraocular lens: Secondary | ICD-10-CM | POA: Diagnosis not present

## 2021-10-29 ENCOUNTER — Encounter: Payer: Medicare HMO | Admitting: Internal Medicine

## 2021-11-04 ENCOUNTER — Ambulatory Visit (INDEPENDENT_AMBULATORY_CARE_PROVIDER_SITE_OTHER): Payer: Medicare HMO

## 2021-11-04 DIAGNOSIS — E785 Hyperlipidemia, unspecified: Secondary | ICD-10-CM

## 2021-11-04 DIAGNOSIS — E1369 Other specified diabetes mellitus with other specified complication: Secondary | ICD-10-CM

## 2021-11-04 DIAGNOSIS — I10 Essential (primary) hypertension: Secondary | ICD-10-CM

## 2021-11-04 NOTE — Progress Notes (Signed)
Chronic Care Management Pharmacy Note  11/12/2021 Name:  Mark Allen MRN:  [id number] DOB:  1951-01-08  Recommendations/Changes made from today's visit:  Summary: - Trulicity PAP- will try, unable to confirm income time of visit. -130s FBG AM, 2 hours after lunch 170-180  Subjective: Mark Allen is an 71 y.o. year old male who is a primary patient of Vigg, Avanti, MD.  The CCM team was consulted for assistance with disease management and care coordination needs.    Engaged with patient by telephone for follow up visit in response to provider referral for pharmacy case management and/or care coordination services.   Consent to Services:  The patient was given information about Chronic Care Management services, agreed to services, and gave verbal consent prior to initiation of services.  Please see initial visit note for detailed documentation.   Patient Care Team: Charlynne Cousins, MD as PCP - Glory Buff, [facility] as Pharmacist (Pharmacist)  Objective:  Lab Results  Component Value Date   CREATININE 0.73 (L) 07/23/2021   CREATININE 0.76 06/18/2021   CREATININE 0.82 11/26/2020    Lab Results  Component Value Date   HGBA1C 7.0 (H) 10/09/2021   Last diabetic Eye exam:  Lab Results  Component Value Date/Time   HMDIABEYEEXA Retinopathy (A) 09/19/2021 12:00 AM    Last diabetic Foot exam: No results found for: HMDIABFOOTEX      Component Value Date/Time   CHOL 136 10/02/2021 1357   CHOL 145 03/23/2017 1607   TRIG 124 10/02/2021 1357   TRIG 74 03/23/2017 1607   HDL 52 10/02/2021 1357   CHOLHDL 2.6 10/02/2021 1357   VLDL 15 03/23/2017 1607   LDLCALC 62 10/02/2021 1357    Hepatic Function Latest Ref Rng & Units 07/23/2021 11/26/2020 05/25/2020  Total Protein 6.0 - 8.5 g/dL 6.9 7.2 7.5  Albumin 3.8 - 4.8 g/dL 4.6 4.6 4.7  AST 0 - 40 IU/L $Remov'16 13 14  'WedOGI$ ALT 0 - 44 IU/L $Remov'21 17 21  'mcpTdL$ Alk Phosphatase 44 - 121 IU/L 72 72 76  Total Bilirubin 0.0 - 1.2 mg/dL 0.5 0.3 0.3     Lab Results  Component Value Date/Time   TSH 1.760 07/23/2021 09:00 AM   TSH 2.100 11/10/2017 03:41 PM    CBC Latest Ref Rng & Units 10/09/2021 10/09/2021 07/23/2021  WBC 3.4 - 10.8 x10E3/uL 7.1 7.5 6.3  Hemoglobin 13.0 - 17.7 g/dL 15.7 15.6 15.4  Hematocrit 37.5 - 51.0 % 45.9 45.8 45.9  Platelets 150 - 450 x10E3/uL 356 350 341    No results found for: VD25OH  Clinical ASCVD:  The 10-year ASCVD risk score (Arnett DK, et al., 2019) is: 22%   Values used to calculate the score:     Age: 19 years     Sex: Male     Is Non-Hispanic African American: No     Diabetic: Yes     Tobacco smoker: No     Systolic Blood Pressure: 056 mmHg     Is BP treated: Yes     HDL Cholesterol: 52 mg/dL     Total Cholesterol: 136 mg/dL    Social History   Tobacco Use  Smoking Status Former   Types: Cigarettes   Quit date: 01/27/1989   Years since quitting: 32.8  Smokeless Tobacco Former   Types: Chew   Quit date: 09/29/1990   BP Readings from Last 3 Encounters:  10/09/21 115/73  09/11/21 116/72  07/30/21 111/74   Pulse Readings from Last 3  Encounters:  10/09/21 (!) 59  09/11/21 73  07/30/21 63   Wt Readings from Last 3 Encounters:  10/09/21 204 lb 12.8 oz (92.9 kg)  09/11/21 205 lb 3.2 oz (93.1 kg)  07/30/21 205 lb 3.2 oz (93.1 kg)    Assessment: Review of patient past medical history, allergies, medications, health status, including review of consultants reports, laboratory and other test data, was performed as part of comprehensive evaluation and provision of chronic care management services.   SDOH:  (Social Determinants of Health) assessments and interventions performed: Yes   CCM Care Plan  Allergies  Allergen Reactions   Sulfa Antibiotics    Sulfacetamide Sodium     Medications Reviewed Today     Reviewed by Jerelene Redden, CMA (Certified Medical Assistant) on 10/09/21 at [phone]  Med List Status: <None>   Medication Order Taking? Sig Documenting Provider Last Dose  Status Informant  amLODipine (NORVASC) 5 MG tablet [id number]  TAKE 1 TABLET EVERY DAY Vigg, Avanti, MD  Active   benazepril (LOTENSIN) 40 MG tablet [id number] No TAKE 1 TABLET EVERY DAY Vigg, Avanti, MD Taking Active   Blood Glucose Calibration (TRUE METRIX LEVEL 3) High SOLN [id number] No  [provider] Taking Active   Blood Glucose Monitoring Suppl (TRUE METRIX METER) w/Device KIT [id number] No USE AS DIRECTED [place], [place], DO Taking Active   cyanocobalamin 1000 MCG tablet [id number] No Take 1,000 mcg by mouth daily. [provider] Taking Active   DROPLET PEN NEEDLES 32G X 4 MM MISC [id number] No USE 2 (TWO) TIMES DAILY AS NEEDED. [name], PA-C Taking Active   Dulaglutide (TRULICITY) 4.40 HK/7.4QV SOPN [id number]  Inject 0.75 mg into the skin once a week. 1 week supply please Vigg, Avanti, MD  Active   Dulaglutide (TRULICITY) 3.32 RJ/1.8AC SOPN [id number]  Inject 0.75 mg into the skin once a week. Charlynne Cousins, MD  Active   DULoxetine (CYMBALTA) 60 MG capsule [id number] No TAKE 1 CAPSULE BY MOUTH EVERY DAY McElwee, Lauren A, NP Taking Active   empagliflozin (JARDIANCE) 25 MG TABS tablet [id number] No Take 1 tablet (25 mg total) by mouth daily. Charlynne Cousins, MD Taking Active   metFORMIN (GLUCOPHAGE) 1000 MG tablet [id number]  TAKE 1 TABLET TWICE DAILY WITH MEALS Vigg, Avanti, MD  Active   rosuvastatin (CRESTOR) 40 MG tablet [id number] No TAKE 1 TABLET EVERY DAY Vigg, Avanti, MD Taking Active   sildenafil (VIAGRA) 100 MG tablet [id number] No TAKE 1 TABLET DAILY AS NEEDED FOR ERECTILE DYSFUNCTION McElwee, Lauren A, NP Taking Active   traZODone (DESYREL) 50 MG tablet [id number]  Take 1 tablet (50 mg total) by mouth at bedtime as needed for sleep. Charlynne Cousins, MD  Active   TRUE METRIX BLOOD GLUCOSE TEST test strip [id number] No CHECK BLOOD SUGAR TWICE DAILY Vigg, Avanti, MD Taking Active   TRUEplus Lancets 28G [place] [id number] No  [provider] Taking Active              Patient Active Problem List   Diagnosis Date Noted   Essential hypertension 10/09/2021   Pain due to onychomycosis of toenails of both feet 10/03/2021   Diabetes mellitus without complication ([place]) 89/38/1017   Need for shingles vaccine 07/30/2021   Peripheral neuropathy 07/01/2021   Tinnitus 07/01/2021   Need for influenza vaccination 07/01/2021   Diabetes mellitus ([place]) 06/18/2021   Screening for prostate cancer 06/18/2021   Vitamin B 12 deficiency 02/28/2021   ED (erectile dysfunction) 09/19/2018   Insomnia 05/29/2015  Type II diabetes mellitus with neurological manifestations ([place])    Hypertension associated with diabetes ([place])    Hyperlipidemia    History of nonmelanoma skin cancer 03/03/2013    Immunization History  Administered Date(s) Administered   Fluad Quad(high Dose 65+) 06/12/2020, 06/18/2021   Influenza, High Dose Seasonal PF 06/16/2018, 07/01/2019   Influenza,inj,Quad PF,6+ Mos 07/06/2015, 06/25/2016, 06/23/2017   Influenza-Unspecified 08/17/2014, 06/22/2019   PFIZER(Purple Top)SARS-COV-2 Vaccination 12/12/2019, 01/02/2020   Pneumococcal Conjugate-13 03/15/2018   Pneumococcal Polysaccharide-23 06/12/2020   Pneumococcal-Unspecified 07/10/2008   Td 07/17/2009, 05/25/2020   Zoster Recombinat (Shingrix) 07/30/2021   Zoster, Live 08/10/2012    Conditions to be addressed/monitored: T2DM, HTN, HLD, Insomnia, ED, b12 deficiency  Care Plan : [place]  Updates made by Madelin Rear, [facility] since 11/12/2021 12:00 AM     Problem: Diabetes, HTN, HLD, neuropathy, insomnia   Priority: High     Long-Range Goal: Disease Management   Start Date: 11/12/2021  Recent Progress: On track  Priority: High  Note:   Current Barriers:  Potential GI side effects with metformin - resolved at 10/2021 f/u visit  Pharmacist Clinical Goal(s):  Patient will verbalize ability to afford treatment regimen contact provider office for questions/concerns as evidenced  notation of same in electronic health record through collaboration with PharmD and provider.   Interventions: 1:1 collaboration with Vigg, Avanti, MD regarding development and update of comprehensive plan of care as evidenced by provider attestation and co-signature Inter-disciplinary care team collaboration (see longitudinal plan of care) Comprehensive medication review performed; medication list updated in electronic medical record  Hypertension (BP goal <130/80) -Controlled -Current treatment: Amlodipine 5 mg once daily Benazepril 40 mg once daily  -Medications previously tried: none  -Current home readings: at goal -Reviewed for side effects - no problems noted. Denies hypotensive/hypertensive symptoms -Educated on Daily salt intake goal < 2300 mg; Importance of home blood pressure monitoring; Proper BP monitoring technique; -Counseled to monitor BP at home 1-2x/week, document, and provide log at future appointments -Counseled on diet and exercise extensively Recommended to continue current medication  Hyperlipidemia: (LDL goal < 70) -Controlled -Current treatment: Rosuvastatin 40 mg once daily -Reviewed for side effects/tolerability - no issues -Educated on Exercise goal of 150 minutes per week; -Counseled on diet and exercise extensively Recommended to continue current medication  Diabetes (A1c goal <7%) -Controlled-Bordeline -increase from previous a1c, now off of tresiba, non yet titrated to therapeutic dose of trulcity -Current medications: Trulicity 1.19 mg QWeek Metformin 1000 mg twice daily  Jardiance 25 mg once daily  -Current home glucose readings fasting glucose: 130s-140s -Denies hypoglycemic/hyperglycemic symptoms -Current meal patterns: occasional sweets, knows to minimize intake of carbs -Current exercise: active outdoors, golf 2x/week.  -Had not gotten back to the gym, planning to get more active Will try for trulicity pap although uncertrain if pt  would meet income cut off   Patient Goals/Self-Care Activities Patient will:  - take medications as prescribed check blood pressure 1-2x/week, document, and provide at future appointments Start taking metformin with meals.  Follow Up Plan: DM call 3 months, RPH f/u call call 6 months  Medication Assistance: Would not be eligible for Jardiance Patient Assistance based on monthly income. Agreed to try for Trulicity although income not confirmed.      Patient's preferred pharmacy is:  [facility] [address] [place] [address] Phone: [phone] Fax: [place], [place] [address] [place] [address] Phone: [phone] Fax: [place] #[id number] [address], [place] [address] Phone: [phone] Fax: [phone]  [facility] [place], [place] [address] Phone: [phone] Fax: [phone]  [facility] [id number], [place] - [place] [address] Phone: [phone] Fax: [phone]   Follow Up:  Patient agrees to Care Plan and Follow-up.  Plan:  [place] f/u call 6 months. CPA 3 month DM call.  Future Appointments  Date Time Provider [place]  12/02/2021  9:00 AM [facility]  12/09/2021  9:40 AM Charlynne Cousins, MD [facility] P[facility]  04/03/2022  4:00 PM Gardiner Barefoot, DPM [facility]  05/05/2022 11:00 AM [facility] PHARMACY [facility]   Madelin Rear, PharmD, [facility] Clinical Pharmacist  [place]  [phone]

## 2021-11-11 DIAGNOSIS — H6121 Impacted cerumen, right ear: Secondary | ICD-10-CM | POA: Diagnosis not present

## 2021-11-11 DIAGNOSIS — H90A22 Sensorineural hearing loss, unilateral, left ear, with restricted hearing on the contralateral side: Secondary | ICD-10-CM | POA: Diagnosis not present

## 2021-11-12 ENCOUNTER — Telehealth: Payer: Self-pay

## 2021-11-12 NOTE — Chronic Care Management (AMB) (Signed)
° ° °  Chronic Care Management Pharmacy Assistant   Name: Mark Allen  MRN: [id number] DOB: 11/23/1950   Reason for Encounter: Patient assistance application on Trulicity    Medications: Outpatient Encounter Medications as of 11/12/2021  Medication Sig   amLODipine (NORVASC) 5 MG tablet TAKE 1 TABLET EVERY DAY   benazepril (LOTENSIN) 20 MG tablet Take 1 tablet (20 mg total) by mouth daily.   Blood Glucose Calibration (TRUE METRIX LEVEL 3) High SOLN    Blood Glucose Monitoring Suppl (TRUE METRIX METER) w/Device KIT USE AS DIRECTED   cyanocobalamin 1000 MCG tablet Take 1,000 mcg by mouth daily.   DROPLET PEN NEEDLES 32G X 4 MM MISC USE 2 (TWO) TIMES DAILY AS NEEDED.   Dulaglutide (TRULICITY) 3.81 WE/9.9BZ SOPN Inject 0.75 mg into the skin once a week. 1 week supply please   DULoxetine (CYMBALTA) 60 MG capsule TAKE 1 CAPSULE BY MOUTH EVERY DAY   empagliflozin (JARDIANCE) 25 MG TABS tablet Take 1 tablet (25 mg total) by mouth daily.   metFORMIN (GLUCOPHAGE) 1000 MG tablet TAKE 1 TABLET TWICE DAILY WITH MEALS   rosuvastatin (CRESTOR) 40 MG tablet TAKE 1 TABLET EVERY DAY   sildenafil (VIAGRA) 100 MG tablet TAKE 1 TABLET DAILY AS NEEDED FOR ERECTILE DYSFUNCTION   traZODone (DESYREL) 50 MG tablet TAKE 1 TABLET (50 MG TOTAL) BY MOUTH AT BEDTIME AS NEEDED FOR SLEEP.   TRUE METRIX BLOOD GLUCOSE TEST test strip CHECK BLOOD SUGAR TWICE DAILY   TRUEplus Lancets 16R MISC    TRULICITY 6.78 LF/8.1OF SOPN INJECT 0.75 MG INTO THE SKIN ONCE A WEEK.   No facility-administered encounter medications on file as of 11/12/2021.    Prefilled and mailed patient assistance application on Trulicity 7.51 mg. I have explained details on what the patient need to sign and fill out which is all highlighted patient is aware and understands to bring back the form to [place]. I have a lot left my contact information incase he has any questions once he receives the form.  Corrie Mckusick, [place]

## 2021-11-12 NOTE — Patient Instructions (Addendum)
[name],  Thank you for talking with me today. I have included our care plan/goals in the following pages.   Please review and call me at [phone] with any questions.  Thanks! Mark Allen, PharmD Clinical Pharmacist  [phone]  Care Plan : [place]  Updates made by Madelin Rear, [facility] since 11/12/2021 12:00 AM     Problem: Diabetes, HTN, HLD, neuropathy, insomnia   Priority: High     Long-Range Goal: Disease Management   Start Date: 11/12/2021  Recent Progress: On track  Priority: High  Note:   Current Barriers:  Potential GI side effects with metformin - resolved at 10/2021 f/u visit  Pharmacist Clinical Goal(s):  Patient will verbalize ability to afford treatment regimen contact provider office for questions/concerns as evidenced notation of same in electronic health record through collaboration with PharmD and provider.   Interventions: 1:1 collaboration with Vigg, Avanti, MD regarding development and update of comprehensive plan of care as evidenced by provider attestation and co-signature Inter-disciplinary care team collaboration (see longitudinal plan of care) Comprehensive medication review performed; medication list updated in electronic medical record  Hypertension (BP goal <130/80) -Controlled -Current treatment: Amlodipine 5 mg once daily Benazepril 40 mg once daily  -Medications previously tried: none  -Current home readings: at goal -Reviewed for side effects - no problems noted. Denies hypotensive/hypertensive symptoms -Educated on Daily salt intake goal < 2300 mg; Importance of home blood pressure monitoring; Proper BP monitoring technique; -Counseled to monitor BP at home 1-2x/week, document, and provide log at future appointments -Counseled on diet and exercise extensively Recommended to continue current medication  Hyperlipidemia: (LDL goal < 70) -Controlled -Current treatment: Rosuvastatin 40 mg once  daily -Reviewed for side effects/tolerability - no issues -Educated on Exercise goal of 150 minutes per week; -Counseled on diet and exercise extensively Recommended to continue current medication  Diabetes (A1c goal <7%) -Controlled-Bordeline -increase from previous a1c, now off of tresiba, non yet titrated to therapeutic dose of trulcity -Current medications: Trulicity 4.74 mg QWeek Metformin 1000 mg twice daily  Jardiance 25 mg once daily  -Current home glucose readings fasting glucose: 130s-140s -Denies hypoglycemic/hyperglycemic symptoms -Current meal patterns: occasional sweets, knows to minimize intake of carbs -Current exercise: active outdoors, golf 2x/week.  -Had not gotten back to the gym, planning to get more active Will try for trulicity pap although uncertrain if pt would meet income cut off   Patient Goals/Self-Care Activities Patient will:  - take medications as prescribed check blood pressure 1-2x/week, document, and provide at future appointments Start taking metformin with meals.  Follow Up Plan: DM call 3 months, RPH f/u call call 6 months  Medication Assistance: Would not be eligible for Jardiance Patient Assistance based on monthly income. Agreed to try for Trulicity although income not confirmed.     The patient verbalized understanding of instructions provided today and agreed to receive a MyChart copy of patient instruction and/or educational materials. Telephone follow up appointment with pharmacy team member scheduled for: See next appointment with "Care Management Staff" under "What's Next" below.  Diabetes Mellitus and Nutrition, Adult When you have diabetes, or diabetes mellitus, it is very important to have healthy eating habits because your blood sugar (glucose) levels are greatly affected by what you eat and drink. Eating healthy foods in the right amounts, at about the same times every day, can help you: Manage your blood glucose. Lower your risk  of heart disease. Improve your blood pressure. Reach or  maintain a healthy weight. What can affect my meal plan? Every person with diabetes is different, and each person has different needs for a meal plan. Your health care provider may recommend that you work with a dietitian to make a meal plan that is best for you. Your meal plan may vary depending on factors such as: The calories you need. The medicines you take. Your weight. Your blood glucose, blood pressure, and cholesterol levels. Your activity level. Other health conditions you have, such as heart or kidney disease. How do carbohydrates affect me? Carbohydrates, also called carbs, affect your blood glucose level more than any other type of food. Eating carbs raises the amount of glucose in your blood. It is important to know how many carbs you can safely have in each meal. This is different for every person. Your dietitian can help you calculate how many carbs you should have at each meal and for each snack. How does alcohol affect me? Alcohol can cause a decrease in blood glucose (hypoglycemia), especially if you use insulin or take certain diabetes medicines by mouth. Hypoglycemia can be a life-threatening condition. Symptoms of hypoglycemia, such as sleepiness, dizziness, and confusion, are similar to symptoms of having too much alcohol. Do not drink alcohol if: Your health care provider tells you not to drink. You are pregnant, may be pregnant, or are planning to become pregnant. If you drink alcohol: Limit how much you have to: 0-1 drink a day for women. 0-2 drinks a day for men. Know how much alcohol is in your drink. In the [place] one drink equals one 12 oz bottle of beer (355 mL), one 5 oz glass of wine (148 mL), or one 1 oz glass of hard liquor (44 mL). Keep yourself hydrated with water, diet soda, or unsweetened iced tea. Keep in mind that regular soda, juice, and other mixers may contain a lot of sugar and must be counted  as carbs. What are tips for following this plan? Reading food labels Start by checking the serving size on the Nutrition Facts label of packaged foods and drinks. The number of calories and the amount of carbs, fats, and other nutrients listed on the label are based on one serving of the item. Many items contain more than one serving per package. Check the total grams (g) of carbs in one serving. Check the number of grams of saturated fats and trans fats in one serving. Choose foods that have a low amount or none of these fats. Check the number of milligrams (mg) of salt (sodium) in one serving. Most people should limit total sodium intake to less than 2,300 mg per day. Always check the nutrition information of foods labeled as "low-fat" or "nonfat." These foods may be higher in added sugar or refined carbs and should be avoided. Talk to your dietitian to identify your daily goals for nutrients listed on the label. Shopping Avoid buying canned, pre-made, or processed foods. These foods tend to be high in fat, sodium, and added sugar. Shop around the outside edge of the grocery store. This is where you will most often find fresh fruits and vegetables, bulk grains, fresh meats, and fresh dairy products. Cooking Use low-heat cooking methods, such as baking, instead of high-heat cooking methods, such as deep frying. Cook using healthy oils, such as olive, canola, or sunflower oil. Avoid cooking with butter, cream, or high-fat meats. Meal planning Eat meals and snacks regularly, preferably at the same times every day. Avoid going long periods  of time without eating. Eat foods that are high in fiber, such as fresh fruits, vegetables, beans, and whole grains. Eat 4-6 oz (112-168 g) of lean protein each day, such as lean meat, chicken, fish, eggs, or tofu. One ounce (oz) (28 g) of lean protein is equal to: 1 oz (28 g) of meat, chicken, or fish. 1 egg.  cup (62 g) of tofu. Eat some foods each day that  contain healthy fats, such as avocado, nuts, seeds, and fish. What foods should I eat? Fruits Berries. Apples. Oranges. Peaches. Apricots. Plums. Grapes. Mangoes. Papayas. Pomegranates. Kiwi. Cherries. Vegetables Leafy greens, including lettuce, spinach, kale, chard, collard greens, mustard greens, and cabbage. Beets. Cauliflower. Broccoli. Carrots. Green beans. Tomatoes. Peppers. Onions. Cucumbers. Brussels sprouts. Grains Whole grains, such as whole-wheat or whole-grain bread, crackers, tortillas, cereal, and pasta. Unsweetened oatmeal. Quinoa. Brown or wild rice. Meats and other proteins Seafood. Poultry without skin. Lean cuts of poultry and beef. Tofu. Nuts. Seeds. Dairy Low-fat or fat-free dairy products such as milk, yogurt, and cheese. The items listed above may not be a complete list of foods and beverages you can eat and drink. Contact a dietitian for more information. What foods should I avoid? Fruits Fruits canned with syrup. Vegetables Canned vegetables. Frozen vegetables with butter or cream sauce. Grains Refined white flour and flour products such as bread, pasta, snack foods, and cereals. Avoid all processed foods. Meats and other proteins Fatty cuts of meat. Poultry with skin. Breaded or fried meats. Processed meat. Avoid saturated fats. Dairy Full-fat yogurt, cheese, or milk. Beverages Sweetened drinks, such as soda or iced tea. The items listed above may not be a complete list of foods and beverages you should avoid. Contact a dietitian for more information. Questions to ask a health care provider Do I need to meet with a certified diabetes care and education specialist? Do I need to meet with a dietitian? What number can I call if I have questions? When are the best times to check my blood glucose? Where to find more information: American Diabetes Association: diabetes.org Academy of Nutrition and Dietetics: eatright.[employer] of Diabetes and  Digestive and Kidney Diseases: [employer].is Association of Diabetes Care & Education Specialists: diabeteseducator.org Summary It is important to have healthy eating habits because your blood sugar (glucose) levels are greatly affected by what you eat and drink. It is important to use alcohol carefully. A healthy meal plan will help you manage your blood glucose and lower your risk of heart disease. Your health care provider may recommend that you work with a dietitian to make a meal plan that is best for you. This information is not intended to replace advice given to you by your health care provider. Make sure you discuss any questions you have with your health care provider. Document Revised: 04/18/2020 Document Reviewed: 04/18/2020 Elsevier Patient Education  [place].

## 2021-11-13 ENCOUNTER — Encounter: Payer: Self-pay | Admitting: Internal Medicine

## 2021-11-18 DIAGNOSIS — H2511 Age-related nuclear cataract, right eye: Secondary | ICD-10-CM | POA: Diagnosis not present

## 2021-11-25 DIAGNOSIS — H90A22 Sensorineural hearing loss, unilateral, left ear, with restricted hearing on the contralateral side: Secondary | ICD-10-CM | POA: Diagnosis not present

## 2021-11-26 ENCOUNTER — Other Ambulatory Visit: Payer: Self-pay | Admitting: Nurse Practitioner

## 2021-11-26 ENCOUNTER — Other Ambulatory Visit: Payer: Self-pay | Admitting: Family Medicine

## 2021-11-26 DIAGNOSIS — E785 Hyperlipidemia, unspecified: Secondary | ICD-10-CM | POA: Diagnosis not present

## 2021-11-26 DIAGNOSIS — E1369 Other specified diabetes mellitus with other specified complication: Secondary | ICD-10-CM

## 2021-11-26 DIAGNOSIS — I10 Essential (primary) hypertension: Secondary | ICD-10-CM

## 2021-11-26 NOTE — Telephone Encounter (Signed)
Refilled 07/18/2021 #90 1 refill  - 6 month supply - should last until 01/16/2022. Requested Prescriptions  Pending Prescriptions Disp Refills   JARDIANCE 25 MG TABS tablet [Pharmacy Med Name: JARDIANCE 25 MG Tablet] 90 tablet 1    Sig: TAKE 1 TABLET EVERY DAY     Endocrinology:  Diabetes - SGLT2 Inhibitors Failed - 11/26/2021  8:35 AM      Failed - Cr in normal range and within 360 days    Creatinine, Ser  Date Value Ref Range Status  07/23/2021 0.73 (L) 0.76 - 1.27 mg/dL Final         Passed - HBA1C is between 0 and 7.9 and within 180 days    Hemoglobin A1C  Date Value Ref Range Status  05/22/2016 7.6  Final   HB A1C (BAYER DCA - WAIVED)  Date Value Ref Range Status  10/09/2021 7.0 (H) 4.8 - 5.6 % Final    Comment:             Prediabetes: 5.7 - 6.4          Diabetes: >6.4          Glycemic control for adults with diabetes: <7.0          Passed - eGFR in normal range and within 360 days    GFR calc Af Amer  Date Value Ref Range Status  05/25/2020 107 >59 mL/min/1.73 Final    Comment:    **[facility] currently reports eGFR in compliance with the current**   recommendations of the [employer]. [facility] will   update reporting as new guidelines are published from the NKF-ASN   Task force.    GFR calc non Af Amer  Date Value Ref Range Status  05/25/2020 92 >59 mL/min/1.73 Final   eGFR  Date Value Ref Range Status  07/23/2021 99 >59 mL/min/1.73 Final         Passed - Valid encounter within last 6 months    Recent Outpatient Visits          1 month ago Diabetes mellitus without complication ([place])   [facility] Vigg, Avanti, MD   2 months ago Diabetes mellitus without complication ([place])   [facility] Vigg, Avanti, MD   3 months ago Diabetes mellitus without complication ([place])   [facility] Vigg, Avanti, MD   5 months ago Need for influenza vaccination   [facility] Vigg, Avanti, MD   9 months ago  Hypertension associated with diabetes ([place])   Bradley, Lauren A, NP      Future Appointments            In 1 week Vigg, Avanti, MD [facility]

## 2021-12-02 ENCOUNTER — Other Ambulatory Visit: Payer: Medicare HMO

## 2021-12-02 ENCOUNTER — Other Ambulatory Visit: Payer: Self-pay

## 2021-12-02 DIAGNOSIS — E1369 Other specified diabetes mellitus with other specified complication: Secondary | ICD-10-CM

## 2021-12-02 LAB — CBC WITH DIFFERENTIAL/PLATELET
Hematocrit: 44.2 % (ref 37.5–51.0)
Hemoglobin: 15.3 g/dL (ref 13.0–17.7)
Lymphocytes Absolute: 1.7 10*3/uL (ref 0.7–3.1)
Lymphs: 21 %
MCH: 31.8 pg (ref 26.6–33.0)
MCHC: 34.6 g/dL (ref 31.5–35.7)
MCV: 92 fL (ref 79–97)
MID (Absolute): 0.9 10*3/uL (ref 0.1–1.6)
MID: 11 %
Neutrophils Absolute: 5.5 10*3/uL (ref 1.4–7.0)
Neutrophils: 68 %
Platelets: 249 10*3/uL (ref 150–450)
RBC: 4.81 x10E6/uL (ref 4.14–5.80)
RDW: 13.1 % (ref 11.6–15.4)
WBC: 8.1 10*3/uL (ref 3.4–10.8)

## 2021-12-02 LAB — BAYER DCA HB A1C WAIVED: HB A1C (BAYER DCA - WAIVED): 7.8 % — ABNORMAL HIGH (ref 4.8–5.6)

## 2021-12-03 LAB — COMP. METABOLIC PANEL (12)
AST: 10 IU/L (ref 0–40)
Albumin/Globulin Ratio: 2 (ref 1.2–2.2)
Albumin: 4.2 g/dL (ref 3.8–4.8)
Alkaline Phosphatase: 66 IU/L (ref 44–121)
BUN/Creatinine Ratio: 19 (ref 10–24)
BUN: 14 mg/dL (ref 8–27)
Bilirubin Total: 0.3 mg/dL (ref 0.0–1.2)
Calcium: 9.2 mg/dL (ref 8.6–10.2)
Chloride: 103 mmol/L (ref 96–106)
Creatinine, Ser: 0.74 mg/dL — ABNORMAL LOW (ref 0.76–1.27)
Globulin, Total: 2.1 g/dL (ref 1.5–4.5)
Glucose: 204 mg/dL — ABNORMAL HIGH (ref 70–99)
Potassium: 4.9 mmol/L (ref 3.5–5.2)
Sodium: 141 mmol/L (ref 134–144)
Total Protein: 6.3 g/dL (ref 6.0–8.5)
eGFR: 99 mL/min/{1.73_m2} (ref >59)

## 2021-12-05 ENCOUNTER — Other Ambulatory Visit: Payer: Self-pay | Admitting: Otolaryngology

## 2021-12-05 DIAGNOSIS — H90A22 Sensorineural hearing loss, unilateral, left ear, with restricted hearing on the contralateral side: Secondary | ICD-10-CM

## 2021-12-09 ENCOUNTER — Ambulatory Visit (INDEPENDENT_AMBULATORY_CARE_PROVIDER_SITE_OTHER): Payer: Medicare HMO | Admitting: Internal Medicine

## 2021-12-09 ENCOUNTER — Encounter: Payer: Self-pay | Admitting: Internal Medicine

## 2021-12-09 ENCOUNTER — Other Ambulatory Visit: Payer: Self-pay

## 2021-12-09 VITALS — BP 113/71 | HR 63 | Temp 97.5°F | Ht 69.69 in | Wt 200.0 lb

## 2021-12-09 DIAGNOSIS — I10 Essential (primary) hypertension: Secondary | ICD-10-CM

## 2021-12-09 DIAGNOSIS — E119 Type 2 diabetes mellitus without complications: Secondary | ICD-10-CM | POA: Diagnosis not present

## 2021-12-09 DIAGNOSIS — G6289 Other specified polyneuropathies: Secondary | ICD-10-CM | POA: Diagnosis not present

## 2021-12-09 NOTE — Progress Notes (Signed)
? ?BP 113/71   Pulse 63   Temp (!) 97.5 ?F (36.4 ?C) (Oral)   Ht 5' 9.69" (1.77 m)   Wt 200 lb (90.7 kg)   SpO2 96%   BMI 28.96 kg/m?   ? ?Subjective:  ? ? Patient ID: Mark Allen, male    DOB: [birth year], 71 y.o.   MRN: [id number] ? ?Chief Complaint  ?Patient presents with  ? Annual Exam  ? ? ?HPI: ?Mark Allen is a 71 y.o. male ? ?Diabetes ?He presents for his follow-up diabetic visit. He has type 2 diabetes mellitus. His disease course has been fluctuating. Pertinent negatives for diabetes include no blurred vision, no chest pain, no fatigue, no foot paresthesias, no foot ulcerations, no polydipsia, no polyphagia, no visual change, no weakness and no weight loss.  ?Hypertension ?This is a chronic problem. The current episode started more than 1 month ago. The problem has been waxing and waning since onset. The problem is controlled. Pertinent negatives include no blurred vision or chest pain.  ?Hyperlipidemia ?This is a chronic problem. The problem is controlled. Pertinent negatives include no chest pain. The current treatment provides no improvement of lipids.  ? ?Chief Complaint  ?Patient presents with  ? Annual Exam  ? ? ?Relevant past medical, surgical, family and social history reviewed and updated as indicated. Interim medical history since our last visit reviewed. ?Allergies and medications reviewed and updated. ? ?Review of Systems  ?Constitutional:  Negative for fatigue and weight loss.  ?Eyes:  Negative for blurred vision.  ?Cardiovascular:  Negative for chest pain.  ?Endocrine: Negative for polydipsia and polyphagia.  ?Neurological:  Negative for weakness.  ? ?Per HPI unless specifically indicated above ? ?   ?Objective:  ?  ?BP 113/71   Pulse 63   Temp (!) 97.5 ?F (36.4 ?C) (Oral)   Ht 5' 9.69" (1.77 m)   Wt 200 lb (90.7 kg)   SpO2 96%   BMI 28.96 kg/m?   ?Wt Readings from Last 3 Encounters:  ?12/09/21 200 lb (90.7 kg)  ?10/09/21 204 lb 12.8 oz (92.9 kg)  ?09/11/21 205 lb 3.2 oz (93.1  kg)  ?  ?Physical Exam ?Vitals and nursing note reviewed.  ?Constitutional:   ?   General: He is not in acute distress. ?   Appearance: Normal appearance. He is not ill-appearing or diaphoretic.  ?HENT:  ?   Head: Atraumatic.  ?   Right Ear: There is no impacted cerumen.  ?Eyes:  ?   Conjunctiva/sclera: Conjunctivae normal.  ?   Pupils: Pupils are equal, round, and reactive to light.  ?Cardiovascular:  ?   Rate and Rhythm: Normal rate and regular rhythm.  ?   Heart sounds: No murmur heard. ?  No friction rub. No gallop.  ?Pulmonary:  ?   Effort: No respiratory distress.  ?   Breath sounds: No stridor. No wheezing or rhonchi.  ?Chest:  ?   Chest wall: No tenderness.  ?Abdominal:  ?   General: Abdomen is flat. Bowel sounds are normal.  ?   Palpations: Abdomen is soft. There is no mass.  ?   Tenderness: There is no abdominal tenderness.  ?Musculoskeletal:  ?   Cervical back: Normal range of motion and neck supple. No rigidity or tenderness.  ?   Left lower leg: No edema.  ?Skin: ?   General: Skin is warm and dry.  ?Neurological:  ?   Mental Status: He is alert.  ? ? ?Results for orders  placed or performed in visit on 12/02/21  ?Comp. Metabolic Panel (12)  ?Result Value Ref Range  ? Glucose 204 (H) 70 - 99 mg/dL  ? BUN 14 8 - 27 mg/dL  ? Creatinine, Ser 0.74 (L) 0.76 - 1.27 mg/dL  ? eGFR 99 >59 mL/min/1.73  ? BUN/Creatinine Ratio 19 10 - 24  ? Sodium 141 134 - 144 mmol/L  ? Potassium 4.9 3.5 - 5.2 mmol/L  ? Chloride 103 96 - 106 mmol/L  ? Calcium 9.2 8.6 - 10.2 mg/dL  ? Total Protein 6.3 6.0 - 8.5 g/dL  ? Albumin 4.2 3.8 - 4.8 g/dL  ? Globulin, Total 2.1 1.5 - 4.5 g/dL  ? Albumin/Globulin Ratio 2.0 1.2 - 2.2  ? Bilirubin Total 0.3 0.0 - 1.2 mg/dL  ? Alkaline Phosphatase 66 44 - 121 IU/L  ? AST 10 0 - 40 IU/L  ?Bayer DCA Hb A1c Waived  ?Result Value Ref Range  ? HB A1C (BAYER DCA - WAIVED) 7.8 (H) 4.8 - 5.6 %  ?CBC With Differential/Platelet  ?Result Value Ref Range  ? WBC 8.1 3.4 - 10.8 x10E3/uL  ? RBC 4.81 4.14 - 5.80  x10E6/uL  ? Hemoglobin 15.3 13.0 - 17.7 g/dL  ? Hematocrit 44.2 37.5 - 51.0 %  ? MCV 92 79 - 97 fL  ? MCH 31.8 26.6 - 33.0 pg  ? MCHC 34.6 31.5 - 35.7 g/dL  ? RDW 13.1 11.6 - 15.4 %  ? Platelets 249 150 - 450 x10E3/uL  ? Neutrophils 68 Not Estab. %  ? Lymphs 21 Not Estab. %  ? MID 11 Not Estab. %  ? Neutrophils Absolute 5.5 1.4 - 7.0 x10E3/uL  ? Lymphocytes Absolute 1.7 0.7 - 3.1 x10E3/uL  ? MID (Absolute) 0.9 0.1 - 1.6 X10E3/uL  ? ?   ? ? ?Current Outpatient Medications:  ?  amLODipine (NORVASC) 5 MG tablet, TAKE 1 TABLET EVERY DAY, Disp: 90 tablet, Rfl: 1 ?  benazepril (LOTENSIN) 20 MG tablet, Take 1 tablet (20 mg total) by mouth daily., Disp: 30 tablet, Rfl: 3 ?  Blood Glucose Calibration (TRUE METRIX LEVEL 3) High SOLN, , Disp: , Rfl:  ?  Blood Glucose Monitoring Suppl (TRUE METRIX METER) w/Device KIT, USE AS DIRECTED, Disp: 1 kit, Rfl: 1 ?  cyanocobalamin 1000 MCG tablet, Take 1,000 mcg by mouth daily., Disp: , Rfl:  ?  DROPLET PEN NEEDLES 32G X 4 MM MISC, USE 2 (TWO) TIMES DAILY AS NEEDED., Disp: 200 each, Rfl: 2 ?  Dulaglutide (TRULICITY) 0.75 MG/0.5ML SOPN, Inject 0.75 mg into the skin once a week. 1 week supply please, Disp: 0.5 mL, Rfl: 0 ?  DULoxetine (CYMBALTA) 60 MG capsule, TAKE 1 CAPSULE EVERY DAY, Disp: 90 capsule, Rfl: 2 ?  empagliflozin (JARDIANCE) 25 MG TABS tablet, Take 1 tablet (25 mg total) by mouth daily., Disp: 90 tablet, Rfl: 1 ?  metFORMIN (GLUCOPHAGE) 1000 MG tablet, TAKE 1 TABLET TWICE DAILY WITH MEALS, Disp: 180 tablet, Rfl: 1 ?  rosuvastatin (CRESTOR) 40 MG tablet, TAKE 1 TABLET EVERY DAY, Disp: 90 tablet, Rfl: 3 ?  sildenafil (VIAGRA) 100 MG tablet, TAKE 1 TABLET DAILY AS NEEDED FOR ERECTILE DYSFUNCTION, Disp: 6 tablet, Rfl: 11 ?  traZODone (DESYREL) 50 MG tablet, TAKE 1 TABLET (50 MG TOTAL) BY MOUTH AT BEDTIME AS NEEDED FOR SLEEP., Disp: 90 tablet, Rfl: 0 ?  TRUE METRIX BLOOD GLUCOSE TEST test strip, CHECK BLOOD SUGAR TWICE DAILY, Disp: 200 strip, Rfl: 1 ?  TRUEplus Lancets 28G MISC,  ,   Disp: , Rfl:  ?  TRULICITY 0.75 MG/0.5ML SOPN, INJECT 0.75 MG INTO THE SKIN ONCE A WEEK., Disp: 0.5 mL, Rfl: 11  ? ? ?Assessment & Plan:  ?DM : IS ON rulicity 0.75 once a week , improved Continue metformin 2000 mg daily and Jardiance 25 mg daily. however fasting blood sugars have been rising and per patient's verbal record highest has been around 180 since stopping insulin completely ?  ?2. Insomnia is on trazadone continue only as needed. Sleep hygiene explained to pt  ?  ?3. Tinnitus in left ear worsening, has had this in the past however over last 2 weeks getting worse.  ?Will refer to ENT for such  ?? Etio  ?Will check CBC for anemia ?  ?  ?4. HTN:  ?Is on  benazepril  20 mg daily. Continue norvasc 5 mg ?Continue current meds.  Medication compliance emphasised. pt advised to keep Bp logs. Pt verbalised understanding of the same. Pt to have a low salt diet . Exercise to reach a goal of at least 150 mins a week.  lifestyle modifications explained and pt understands importance of the above. ?Under good control on current regimen. Continue current regimen. Continue to monitor. Call with any concerns. Refills given. Labs drawn today. ? ? ?5. HLD: Recheck FLP, check LFT's work on diet, SE of meds explained to pt. low fat and high fiber diet explained to pt. ? ? ?Problem List Items Addressed This Visit   ? ?  ? Cardiovascular and Mediastinum  ? Essential hypertension - Primary  ?  ? Endocrine  ? Diabetes mellitus without complication (HCC)  ?  ? Nervous and Auditory  ? Peripheral neuropathy  ?  ? ?No orders of the defined types were placed in this encounter. ?  ? ?No orders of the defined types were placed in this encounter. ?  ? ?Follow up plan: ?Return in about 3 months (around 03/11/2022). ? ? ? ?

## 2021-12-12 ENCOUNTER — Encounter: Payer: Self-pay | Admitting: Internal Medicine

## 2021-12-12 MED ORDER — EMPAGLIFLOZIN 25 MG PO TABS: 25.0000 mg | ORAL_TABLET | Freq: Every day | ORAL | 1 refills | Status: DC

## 2021-12-12 MED ORDER — SILDENAFIL CITRATE 100 MG PO TABS: ORAL_TABLET | ORAL | 11 refills | Status: DC

## 2021-12-12 NOTE — Telephone Encounter (Signed)
Patient was last seen 12/09/21 ? ?Up coming visit 03/25/22 ?

## 2021-12-22 ENCOUNTER — Other Ambulatory Visit: Payer: Self-pay | Admitting: Internal Medicine

## 2021-12-22 DIAGNOSIS — H9312 Tinnitus, left ear: Secondary | ICD-10-CM

## 2021-12-22 DIAGNOSIS — E119 Type 2 diabetes mellitus without complications: Secondary | ICD-10-CM

## 2021-12-22 DIAGNOSIS — I10 Essential (primary) hypertension: Secondary | ICD-10-CM

## 2021-12-24 NOTE — Telephone Encounter (Signed)
Requested Prescriptions  ?Pending Prescriptions Disp Refills  ?? benazepril (LOTENSIN) 20 MG tablet [Pharmacy Med Name: BENAZEPRIL HYDROCHLORIDE 20 MG Tablet] 90 tablet 1  ?  Sig: TAKE 1 TABLET EVERY DAY  ?  ? Cardiovascular:  ACE Inhibitors Failed - 12/22/2021  5:27 AM  ?  ?  Failed - Cr in normal range and within 180 days  ?  Creatinine, Ser  ?Date Value Ref Range Status  ?12/02/2021 0.74 (L) 0.76 - 1.27 mg/dL Final  ?   ?  ?  Passed - K in normal range and within 180 days  ?  Potassium  ?Date Value Ref Range Status  ?12/02/2021 4.9 3.5 - 5.2 mmol/L Final  ?   ?  ?  Passed - Patient is not pregnant  ?  ?  Passed - Last BP in normal range  ?  BP Readings from Last 1 Encounters:  ?12/09/21 113/71  ?   ?  ?  Passed - Valid encounter within last 6 months  ?  Recent Outpatient Visits   ?      ? 2 weeks ago Essential hypertension  ? [facility] Vigg, Avanti, MD  ? 2 months ago Diabetes mellitus without complication ([place])  ? [facility] Vigg, Avanti, MD  ? 3 months ago Diabetes mellitus without complication ([place])  ? [facility] Vigg, Avanti, MD  ? 4 months ago Diabetes mellitus without complication ([place])  ? [facility] Vigg, Avanti, MD  ? 6 months ago Need for influenza vaccination  ? [facility] Vigg, Avanti, MD  ?  ?  ?Future Appointments   ?        ? In 3 months Vigg, Avanti, MD [facility]  ?  ? ?  ?  ?  ? ? ?

## 2021-12-26 ENCOUNTER — Ambulatory Visit
Admission: RE | Admit: 2021-12-26 | Discharge: 2021-12-26 | Disposition: A | Discharge status: Home / Self Care | Payer: Medicare HMO | Source: Ambulatory Visit | Attending: Otolaryngology | Admitting: Otolaryngology

## 2021-12-26 DIAGNOSIS — H90A22 Sensorineural hearing loss, unilateral, left ear, with restricted hearing on the contralateral side: Secondary | ICD-10-CM | POA: Diagnosis not present

## 2021-12-26 MED ORDER — GADOBENATE DIMEGLUMINE 529 MG/ML IV SOLN
18.0000 mL | Freq: Once | INTRAVENOUS | Status: AC | PRN
Start: 2021-12-26 — End: 2021-12-26
  Administered 2021-12-26: 18 mL via INTRAVENOUS

## 2022-01-14 ENCOUNTER — Encounter: Payer: Self-pay | Admitting: Internal Medicine

## 2022-01-15 NOTE — Telephone Encounter (Signed)
Mark Allen is insulin trulicity is not please tell him, can Edison Nasuti help lets try that or pts assistance throught the company.

## 2022-01-21 NOTE — Telephone Encounter (Signed)
Yes im ok with any GLP if its covered  ?Thnx for your inpiut

## 2022-01-22 ENCOUNTER — Telehealth: Payer: Self-pay

## 2022-01-22 NOTE — Progress Notes (Signed)
I have reached out to the patient about possibly trying the Ozempic medication for patient assistance application. I have reviewed the income with him he states it is a 2 person house hold and would be more than what was stated per clinical pharmacist. Could there be any alternatives since the patients income is more than what he is qualified for. ? ?Corrie Mckusick, RMA ?Health Concierge ? ?

## 2022-02-21 NOTE — Progress Notes (Signed)
LVM for patient to return phone call  Mark Allen, [facility]

## 2022-03-03 ENCOUNTER — Encounter: Payer: Self-pay | Admitting: Internal Medicine

## 2022-03-03 ENCOUNTER — Telehealth: Payer: Self-pay

## 2022-03-03 NOTE — Telephone Encounter (Signed)
Error

## 2022-03-13 DIAGNOSIS — H25811 Combined forms of age-related cataract, right eye: Secondary | ICD-10-CM | POA: Diagnosis not present

## 2022-03-13 DIAGNOSIS — H52221 Regular astigmatism, right eye: Secondary | ICD-10-CM | POA: Diagnosis not present

## 2022-03-25 ENCOUNTER — Encounter: Payer: Self-pay | Admitting: Nurse Practitioner

## 2022-03-25 ENCOUNTER — Ambulatory Visit (INDEPENDENT_AMBULATORY_CARE_PROVIDER_SITE_OTHER): Payer: Medicare HMO | Admitting: Nurse Practitioner

## 2022-03-25 VITALS — BP 132/79 | HR 72 | Temp 98.3°F | Wt 203.4 lb

## 2022-03-25 DIAGNOSIS — E1159 Type 2 diabetes mellitus with other circulatory complications: Secondary | ICD-10-CM | POA: Diagnosis not present

## 2022-03-25 DIAGNOSIS — E1149 Type 2 diabetes mellitus with other diabetic neurological complication: Secondary | ICD-10-CM

## 2022-03-25 DIAGNOSIS — E538 Deficiency of other specified B group vitamins: Secondary | ICD-10-CM | POA: Diagnosis not present

## 2022-03-25 DIAGNOSIS — E1369 Other specified diabetes mellitus with other specified complication: Secondary | ICD-10-CM | POA: Diagnosis not present

## 2022-03-25 DIAGNOSIS — Z23 Encounter for immunization: Secondary | ICD-10-CM | POA: Diagnosis not present

## 2022-03-25 DIAGNOSIS — G4700 Insomnia, unspecified: Secondary | ICD-10-CM

## 2022-03-25 DIAGNOSIS — E119 Type 2 diabetes mellitus without complications: Secondary | ICD-10-CM | POA: Diagnosis not present

## 2022-03-25 DIAGNOSIS — I152 Hypertension secondary to endocrine disorders: Secondary | ICD-10-CM | POA: Diagnosis not present

## 2022-03-25 DIAGNOSIS — G6289 Other specified polyneuropathies: Secondary | ICD-10-CM | POA: Diagnosis not present

## 2022-03-25 DIAGNOSIS — I10 Essential (primary) hypertension: Secondary | ICD-10-CM

## 2022-03-25 DIAGNOSIS — E785 Hyperlipidemia, unspecified: Secondary | ICD-10-CM | POA: Diagnosis not present

## 2022-03-25 MED ORDER — AMLODIPINE BESYLATE 5 MG PO TABS: 5.0000 mg | ORAL_TABLET | Freq: Every day | ORAL | 1 refills | Status: DC

## 2022-03-25 MED ORDER — EMPAGLIFLOZIN 25 MG PO TABS
25.0000 mg | ORAL_TABLET | Freq: Every day | ORAL | 1 refills | Status: DC
Start: 2022-03-25 — End: 2022-10-20

## 2022-03-25 MED ORDER — ROSUVASTATIN CALCIUM 40 MG PO TABS: 40.0000 mg | ORAL_TABLET | Freq: Every day | ORAL | 1 refills | Status: DC

## 2022-03-25 MED ORDER — BENAZEPRIL HCL 20 MG PO TABS: 20.0000 mg | ORAL_TABLET | Freq: Every day | ORAL | 1 refills | Status: DC

## 2022-03-25 MED ORDER — TRAZODONE HCL 50 MG PO TABS: 50.0000 mg | ORAL_TABLET | Freq: Every evening | ORAL | 1 refills | Status: DC | PRN

## 2022-03-25 MED ORDER — METFORMIN HCL 1000 MG PO TABS: 1000.0000 mg | ORAL_TABLET | Freq: Two times a day (BID) | ORAL | 1 refills | Status: DC

## 2022-03-25 MED ORDER — DULOXETINE HCL 60 MG PO CPEP: ORAL_CAPSULE | ORAL | 1 refills | Status: DC

## 2022-03-25 NOTE — Assessment & Plan Note (Signed)
Chronic.  Controlled.  Continue with current medication regimen.  Labs ordered today.  Return to clinic in 6 months for reevaluation.  Call sooner if concerns arise.  ? ?

## 2022-03-25 NOTE — Assessment & Plan Note (Signed)
Chronic, stable. Currently taking 16 units tresiba daily, jardiance 25mg ,and  metformin 1,000mg  BID. Last A1C 7.8% . Continue current regimen. Refills sent to the pharmacy. Follow-up in 3 months.

## 2022-03-25 NOTE — Assessment & Plan Note (Signed)
Chronic.  Controlled.  Continue with current medication regimen on Amlodipine 5mg  and Benzapril 40mg  daily.  Labs ordered today.  Refills sent today.  Return to clinic in 3 months for reevaluation.  Call sooner if concerns arise.

## 2022-03-25 NOTE — Assessment & Plan Note (Signed)
Chronic, stable. Currently taking 16 units tresiba daily, jardiance 25mg ,and  metformin 1,000mg  BID. A1C today is 6.4% . Sees improvement with Duloxetine daily.  Continue current regimen. Refills sent to the pharmacy. Follow-up in 3 months.

## 2022-03-26 LAB — LIPID PANEL
Chol/HDL Ratio: 2.7 ratio (ref 0.0–5.0)
Cholesterol, Total: 151 mg/dL (ref 100–199)
HDL: 55 mg/dL (ref >39)
LDL Chol Calc (NIH): 76 mg/dL (ref 0–99)
Triglycerides: 109 mg/dL (ref 0–149)
VLDL Cholesterol Cal: 20 mg/dL (ref 5–40)

## 2022-03-26 LAB — COMPREHENSIVE METABOLIC PANEL
ALT: 20 IU/L (ref 0–44)
AST: 12 IU/L (ref 0–40)
Albumin/Globulin Ratio: 1.9 (ref 1.2–2.2)
Albumin: 4.6 g/dL (ref 3.8–4.8)
Alkaline Phosphatase: 68 IU/L (ref 44–121)
BUN/Creatinine Ratio: 17 (ref 10–24)
BUN: 13 mg/dL (ref 8–27)
Bilirubin Total: 0.5 mg/dL (ref 0.0–1.2)
CO2: 22 mmol/L (ref 20–29)
Calcium: 8.6 mg/dL (ref 8.6–10.2)
Chloride: 97 mmol/L (ref 96–106)
Creatinine, Ser: 0.75 mg/dL — ABNORMAL LOW (ref 0.76–1.27)
Globulin, Total: 2.4 g/dL (ref 1.5–4.5)
Glucose: 279 mg/dL — ABNORMAL HIGH (ref 70–99)
Potassium: 4.7 mmol/L (ref 3.5–5.2)
Sodium: 134 mmol/L (ref 134–144)
Total Protein: 7 g/dL (ref 6.0–8.5)
eGFR: 98 mL/min/{1.73_m2} (ref >59)

## 2022-03-26 LAB — HEMOGLOBIN A1C
Est. average glucose Bld gHb Est-mCnc: 171 mg/dL
Hgb A1c MFr Bld: 7.6 % — ABNORMAL HIGH (ref 4.8–5.6)

## 2022-03-26 LAB — VITAMIN B12: Vitamin B-12: 697 pg/mL (ref 232–1245)

## 2022-03-26 MED ORDER — INSULIN DEGLUDEC 100 UNIT/ML ~~LOC~~ SOPN
16.0000 [IU] | PEN_INJECTOR | Freq: Every day | SUBCUTANEOUS | 1 refills | Status: DC
Start: 2022-03-26 — End: 2022-06-10

## 2022-03-26 NOTE — Progress Notes (Signed)
Please let patient know that his lab work looks good.  A1c was 7.6.  I think we should conitnue with the [place].  I will send the refill into the pharmacy.  He should continue the 16U daily.  Otherwise, his lab work looks great.  Kidneys look like he was a little dehydrated.  I recommend he increase his water intake daily.  Please let me know if he has any questions.

## 2022-03-26 NOTE — Addendum Note (Signed)
Addended by: Jon Billings on: 03/26/2022 07:46 AM   Modules accepted: Orders

## 2022-04-03 ENCOUNTER — Ambulatory Visit: Payer: Medicare HMO | Admitting: Podiatry

## 2022-04-16 ENCOUNTER — Encounter: Payer: Self-pay | Admitting: Nurse Practitioner

## 2022-04-16 DIAGNOSIS — R42 Dizziness and giddiness: Secondary | ICD-10-CM

## 2022-05-05 ENCOUNTER — Ambulatory Visit (INDEPENDENT_AMBULATORY_CARE_PROVIDER_SITE_OTHER): Payer: Medicare HMO

## 2022-05-05 DIAGNOSIS — E785 Hyperlipidemia, unspecified: Secondary | ICD-10-CM

## 2022-05-05 DIAGNOSIS — I152 Hypertension secondary to endocrine disorders: Secondary | ICD-10-CM

## 2022-05-05 DIAGNOSIS — E1149 Type 2 diabetes mellitus with other diabetic neurological complication: Secondary | ICD-10-CM

## 2022-05-05 NOTE — Patient Instructions (Signed)
Visit Information   Goals Addressed   None    Patient Care Plan: CCM Pharmacy Care Plan     Problem Identified: Diabetes, HTN, HLD, neuropathy, insomnia   Priority: High     Long-Range Goal: Disease Management   Start Date: 11/12/2021  Recent Progress: On track  Priority: High  Note:   Current Barriers:  Potential GI side effects with metformin - resolved at 10/2021 f/u visit  Pharmacist Clinical Goal(s):  Patient will verbalize ability to afford treatment regimen contact provider office for questions/concerns as evidenced notation of same in electronic health record through collaboration with PharmD and provider.   Interventions: 1:1 collaboration with Charlynne Cousins, MD regarding development and update of comprehensive plan of care as evidenced by provider attestation and co-signature Inter-disciplinary care team collaboration (see longitudinal plan of care) Comprehensive medication review performed; medication list updated in electronic medical record  Hypertension (BP goal <130/80) BP Readings from Last 3 Encounters:  03/25/22 132/79  12/09/21 113/71  10/09/21 115/73  -Controlled -Current treatment: Amlodipine 5 mg once daily Appropriate, Effective, Safe, Accessible Benazepril 20 mg once daily Appropriate, Effective, Safe, Accessible -Medications previously tried: none  -Current home readings: at goal -Reviewed for side effects - no problems noted. Denies hypotensive/hypertensive symptoms -Educated on Daily salt intake goal < 2300 mg; Importance of home blood pressure monitoring; Proper BP monitoring technique; -Counseled to monitor BP at home 1-2x/week, document, and provide log at future appointments -Counseled on diet and exercise extensively Recommended to continue current medication  Hyperlipidemia: (LDL goal < 70) The 10-year ASCVD risk score (Arnett DK, et al., 2019) is: 30.2%   Values used to calculate the score:     Age: 71 years     Sex: Male     Is  Non-Hispanic African American: No     Diabetic: Yes     Tobacco smoker: No     Systolic Blood Pressure: 594 mmHg     Is BP treated: Yes     HDL Cholesterol: 55 mg/dL     Total Cholesterol: 151 mg/dL Lab Results  Component Value Date   CHOL 151 03/25/2022   CHOL 136 10/02/2021   CHOL 154 07/23/2021   Lab Results  Component Value Date   HDL 55 03/25/2022   HDL 52 10/02/2021   HDL 58 07/23/2021   Lab Results  Component Value Date   LDLCALC 76 03/25/2022   LDLCALC 62 10/02/2021   LDLCALC 78 07/23/2021   Lab Results  Component Value Date   TRIG 109 03/25/2022   TRIG 124 10/02/2021   TRIG 96 07/23/2021   Lab Results  Component Value Date   CHOLHDL 2.7 03/25/2022   CHOLHDL 2.6 10/02/2021   CHOLHDL 2.7 07/23/2021  No results found for: "LDLDIRECT" -Controlled -Current treatment: Rosuvastatin 40 mg once daily Appropriate, Effective, Safe, Accessible -Reviewed for side effects/tolerability - no issues -Educated on Exercise goal of 150 minutes per week; -Counseled on diet and exercise extensively Recommended to continue current medication  Diabetes (A1c goal <8%) Lab Results  Component Value Date   HGBA1C 7.6 (H) 03/25/2022   HGBA1C 7.8 (H) 12/02/2021   HGBA1C 7.0 (H) 10/09/2021   Lab Results  Component Value Date   MICROALBUR 30 (H) 10/09/2021   LDLCALC 76 03/25/2022   CREATININE 0.75 (L) 03/25/2022   Lab Results  Component Value Date   NA 134 03/25/2022   K 4.7 03/25/2022   CREATININE 0.75 (L) 03/25/2022   EGFR 98 03/25/2022   GFRNONAA 92 05/25/2020  GLUCOSE 279 (H) 03/25/2022   Lab Results  Component Value Date   WBC 8.1 12/02/2021   HGB 15.3 12/02/2021   HCT 44.2 12/02/2021   MCV 92 12/02/2021   PLT 249 12/02/2021  -Controlled -Current medications: Metformin 1000 mg twice daily Appropriate, Effective, Query Safe,  Jardiance 25 mg once daily Appropriate, Effective, Safe, Accessible -Tried/failed: Trulicity -Current home glucose  readings Glucose:  August 2023: AM: 140-155 (Pre-prandial) Lunch: 200 (After food) -Denies hypoglycemic/hyperglycemic symptoms -Current meal patterns: occasional sweets, knows to minimize intake of carbs -Current exercise: active outdoors, golf 2x/week.  August 2023: patient working full time and makes too much for PAP. Is able to afford Jardiance. States has diarrhea 4x/week. Recommend Metformin ER trial  Depression/Anxiety (Goal: PHQ9<5) -Controlled -Current treatment: Duloxetine 49m Appropriate, Effective, Safe, Accessible -Medications previously tried/failed: N/A -PHQ9:     03/25/2022    9:46 AM 12/09/2021    9:28 AM 09/11/2021   10:22 AM  Depression screen PHQ 2/9  Decreased Interest 0 0 0  Down, Depressed, Hopeless 0 0 0  PHQ - 2 Score 0 0 0  Altered sleeping 0 0 0  Tired, decreased energy 1 0 0  Change in appetite 0 0 0  Feeling bad or failure about yourself  0 0 0  Trouble concentrating 0 0 0  Moving slowly or fidgety/restless 0 0 0  Suicidal thoughts 0 0 0  PHQ-9 Score 1 0 0  Difficult doing work/chores Not difficult at all Not difficult at all   -GAD7:     03/25/2022    9:46 AM 12/09/2021    9:29 AM 09/11/2021   10:22 AM 07/30/2021   10:49 AM  GAD 7 : Generalized Anxiety Score  Nervous, Anxious, on Edge 0 0 0 0  Control/stop worrying 0 0 0 0  Worry too much - different things 0 0 0 0  Trouble relaxing 0 0 0 0  Restless 0 0 0 0  Easily annoyed or irritable 0 0 0 0  Afraid - awful might happen 0 0 0 0  Total GAD 7 Score 0 0 0 0  Anxiety Difficulty Not difficult at all Not difficult at all  Not difficult at all  -Educated on Benefits of medication for symptom control -Recommended to continue current medication   Patient Goals/Self-Care Activities Patient will:  - take medications as prescribed check blood pressure 1-2x/week, document, and provide at future appointments Start taking metformin with meals.  Follow Up Plan: Concierge will check on patient  monthly  Mark Allen Pharm.D. - [phone]      Mark Allen given information about Chronic Care Management services today including:  CCM service includes personalized support from designated clinical staff supervised by his physician, including individualized plan of care and coordination with other care providers 24/7 contact phone numbers for assistance for urgent and routine care needs. Standard insurance, coinsurance, copays and deductibles apply for chronic care management only during months in which we provide at least 20 minutes of these services. Most insurances cover these services at 100%, however patients may be responsible for any copay, coinsurance and/or deductible if applicable. This service may help you avoid the need for more expensive face-to-face services. Only one practitioner may furnish and bill the service in a calendar month. The patient may stop CCM services at any time (effective at the end of the month) by phone call to the office staff.  Patient agreed to services and verbal consent obtained.   The patient verbalized understanding  of instructions, educational materials, and care plan provided today and DECLINED offer to receive copy of patient instructions, educational materials, and care plan.  The pharmacy team will reach out to the patient again over the next 60 days.   Lane Hacker, [place]

## 2022-05-05 NOTE — Progress Notes (Signed)
Chronic Care Management Pharmacy Note  05/05/2022 Name:  Mark Allen MRN:  [id number] DOB:  [birth year]  Recommendations/Changes made from today's visit:  Summary: -Pleasant 71 year old male presents for f/u CCM visit. He is currently working full time as an IT consultant. He also [place] and has a donkey and some cows. He had a horse but they lost it recently after 90 years -Is married with 3 children, 4 granddaughters, and 1 great grand daughter  Recommendation: -Diarrhea 4x/week. Would we try Metformin ER $RemoveBefo'500mg'sawbyDcEJoz$  take 2BID  Subjective: Mark Allen is an 71 y.o. year old male who is a primary patient of Vigg, Avanti, MD.  The CCM team was consulted for assistance with disease management and care coordination needs.    Engaged with patient by telephone for follow up visit in response to provider referral for pharmacy case management and/or care coordination services.   Consent to Services:  The patient was given information about Chronic Care Management services, agreed to services, and gave verbal consent prior to initiation of services.  Please see initial visit note for detailed documentation.   Patient Care Team: Charlynne Cousins, MD as PCP - General Lane Hacker, [facility] (Pharmacist)  Objective:  Lab Results  Component Value Date   CREATININE 0.75 (L) 03/25/2022   CREATININE 0.74 (L) 12/02/2021   CREATININE 0.73 (L) 07/23/2021    Lab Results  Component Value Date   HGBA1C 7.6 (H) 03/25/2022   Last diabetic Eye exam:  Lab Results  Component Value Date/Time   HMDIABEYEEXA Retinopathy (A) 09/19/2021 12:00 AM    Last diabetic Foot exam: No results found for: "HMDIABFOOTEX"      Component Value Date/Time   CHOL 151 03/25/2022 1008   CHOL 145 03/23/2017 1607   TRIG 109 03/25/2022 1008   TRIG 74 03/23/2017 1607   HDL 55 03/25/2022 1008   CHOLHDL 2.7 03/25/2022 1008   VLDL 15 03/23/2017 1607   LDLCALC 76 03/25/2022 1008       Latest  Ref Rng & Units 03/25/2022   10:08 AM 12/02/2021    9:30 AM 07/23/2021    9:00 AM  Hepatic Function  Total Protein 6.0 - 8.5 g/dL 7.0  6.3  6.9   Albumin 3.8 - 4.8 g/dL 4.6  4.2  4.6   AST 0 - 40 IU/L $Remov'12  10  16   'OEQhhZ$ ALT 0 - 44 IU/L 20   21   Alk Phosphatase 44 - 121 IU/L 68  66  72   Total Bilirubin 0.0 - 1.2 mg/dL 0.5  0.3  0.5     Lab Results  Component Value Date/Time   TSH 1.760 07/23/2021 09:00 AM   TSH 2.100 11/10/2017 03:41 PM       Latest Ref Rng & Units 12/02/2021    9:28 AM 10/09/2021    9:12 AM 10/09/2021    9:09 AM  CBC  WBC 3.4 - 10.8 x10E3/uL 8.1  7.1  7.5   Hemoglobin 13.0 - 17.7 g/dL 15.3  15.7  15.6   Hematocrit 37.5 - 51.0 % 44.2  45.9  45.8   Platelets 150 - 450 x10E3/uL 249  356  350     No results found for: "VD25OH"  Clinical ASCVD:  The 10-year ASCVD risk score (Arnett DK, et al., 2019) is: 30.2%   Values used to calculate the score:     Age: 61 years     Sex: Male  Is Non-Hispanic African American: No     Diabetic: Yes     Tobacco smoker: No     Systolic Blood Pressure: 132 mmHg     Is BP treated: Yes     HDL Cholesterol: 55 mg/dL     Total Cholesterol: 151 mg/dL    Social History   Tobacco Use  Smoking Status Former   Types: Cigarettes   Quit date: 01/27/1989   Years since quitting: 33.2  Smokeless Tobacco Former   Types: Chew   Quit date: 09/29/1990   BP Readings from Last 3 Encounters:  03/25/22 132/79  12/09/21 113/71  10/09/21 115/73   Pulse Readings from Last 3 Encounters:  03/25/22 72  12/09/21 63  10/09/21 (!) 59   Wt Readings from Last 3 Encounters:  03/25/22 203 lb 6.4 oz (92.3 kg)  12/09/21 200 lb (90.7 kg)  10/09/21 204 lb 12.8 oz (92.9 kg)    Assessment: Review of patient past medical history, allergies, medications, health status, including review of consultants reports, laboratory and other test data, was performed as part of comprehensive evaluation and provision of chronic care management services.   SDOH:   (Social Determinants of Health) assessments and interventions performed: Yes SDOH Interventions    Flowsheet Row Most Recent Value  SDOH Interventions   Financial Strain Interventions Intervention Not Indicated  Transportation Interventions Intervention Not Indicated       CCM Care Plan  Allergies  Allergen Reactions   Sulfa Antibiotics    Sulfacetamide Sodium     Medications Reviewed Today     Reviewed by Zettie Pho, [facility] (Pharmacist) on 05/05/22 at 1529  Med List Status: <None>   Medication Order Taking? Sig Documenting Provider Last Dose Status Informant  amLODipine (NORVASC) 5 MG tablet [id number]  Take 1 tablet (5 mg total) by mouth daily. Larae Grooms, NP  Active   benazepril (LOTENSIN) 20 MG tablet [id number]  Take 1 tablet (20 mg total) by mouth daily. Larae Grooms, NP  Active   Blood Glucose Calibration (TRUE METRIX LEVEL 3) High SOLN [id number] No  [provider] Taking Active   Blood Glucose Monitoring Suppl (TRUE METRIX METER) w/Device KIT [id number] No USE AS DIRECTED Wiley, Magan, DO Taking Active   cyanocobalamin 1000 MCG tablet [id number] No Take 1,000 mcg by mouth daily. [provider] Taking Active   DROPLET PEN NEEDLES 32G X 4 MM MISC [id number] No USE 2 (TWO) TIMES DAILY AS NEEDED. Particia Nearing, [place] Taking Active   DULoxetine (CYMBALTA) 60 MG capsule [id number]  TAKE 1 CAPSULE EVERY DAY Larae Grooms, NP  Active   empagliflozin (JARDIANCE) 25 MG TABS tablet [id number]  Take 1 tablet (25 mg total) by mouth daily. Larae Grooms, NP  Active   insulin degludec (TRESIBA) 100 UNIT/ML FlexTouch Pen [id number]  Inject 16 Units into the skin at bedtime. Larae Grooms, NP  Active   metFORMIN (GLUCOPHAGE) 1000 MG tablet [id number]  Take 1 tablet (1,000 mg total) by mouth 2 (two) times daily with a meal. Larae Grooms, NP  Active   rosuvastatin (CRESTOR) 40 MG tablet [id number]  Take 1 tablet (40 mg total) by mouth  daily. Larae Grooms, NP  Active   sildenafil (VIAGRA) 100 MG tablet [id number] No TAKE 1 TABLET DAILY AS NEEDED FOR ERECTILE DYSFUNCTION Vigg, Avanti, MD Taking Active   traZODone (DESYREL) 50 MG tablet [id number]  Take 1 tablet (50 mg total) by mouth at bedtime as needed for sleep. Larae Grooms, NP  Active  TRUE METRIX BLOOD GLUCOSE TEST test strip [id number] No CHECK BLOOD SUGAR TWICE DAILY Vigg, Avanti, MD Taking Active   TRUEplus Lancets 28G MISC [id number] No  [provider] Taking Active             Patient Active Problem List   Diagnosis Date Noted   Essential hypertension 10/09/2021   Pain due to onychomycosis of toenails of both feet 10/03/2021   Need for shingles vaccine 07/30/2021   Peripheral neuropathy 07/01/2021   Tinnitus 07/01/2021   Need for influenza vaccination 07/01/2021   Diabetes mellitus ([place]) 06/18/2021   Screening for prostate cancer 06/18/2021   Vitamin B 12 deficiency 02/28/2021   ED (erectile dysfunction) 09/19/2018   Insomnia 05/29/2015   Type II diabetes mellitus with neurological manifestations ([place])    Hypertension associated with diabetes ([place])    Hyperlipidemia    History of nonmelanoma skin cancer 03/03/2013    Immunization History  Administered Date(s) Administered   Fluad Quad(high Dose 65+) 06/12/2020, 06/18/2021   Influenza, High Dose Seasonal PF 06/16/2018, 07/01/2019   Influenza,inj,Quad PF,6+ Mos 07/06/2015, 06/25/2016, 06/23/2017   Influenza-Unspecified 08/17/2014, 06/22/2019   PFIZER(Purple Top)SARS-COV-2 Vaccination 12/12/2019, 01/02/2020   Pneumococcal Conjugate-13 03/15/2018   Pneumococcal Polysaccharide-23 06/12/2020   Pneumococcal-Unspecified 07/10/2008   Td 07/17/2009, 05/25/2020   Zoster Recombinat (Shingrix) 07/30/2021, 03/25/2022   Zoster, Live 08/10/2012    Conditions to be addressed/monitored: T2DM, HTN, HLD, Insomnia, ED, b12 deficiency  Care Plan : [place]  Updates made by  Lane Hacker, [place] since 05/05/2022 12:00 AM     Problem: Diabetes, HTN, HLD, neuropathy, insomnia   Priority: High     Long-Range Goal: Disease Management   Start Date: 11/12/2021  Recent Progress: On track  Priority: High  Note:   Current Barriers:  Potential GI side effects with metformin - resolved at 10/2021 f/u visit  Pharmacist Clinical Goal(s):  Patient will verbalize ability to afford treatment regimen contact provider office for questions/concerns as evidenced notation of same in electronic health record through collaboration with PharmD and provider.   Interventions: 1:1 collaboration with Charlynne Cousins, MD regarding development and update of comprehensive plan of care as evidenced by provider attestation and co-signature Inter-disciplinary care team collaboration (see longitudinal plan of care) Comprehensive medication review performed; medication list updated in electronic medical record  Hypertension (BP goal <130/80) BP Readings from Last 3 Encounters:  03/25/22 132/79  12/09/21 113/71  10/09/21 115/73  -Controlled -Current treatment: Amlodipine 5 mg once daily Appropriate, Effective, Safe, Accessible Benazepril 20 mg once daily Appropriate, Effective, Safe, Accessible -Medications previously tried: none  -Current home readings: at goal -Reviewed for side effects - no problems noted. Denies hypotensive/hypertensive symptoms -Educated on Daily salt intake goal < 2300 mg; Importance of home blood pressure monitoring; Proper BP monitoring technique; -Counseled to monitor BP at home 1-2x/week, document, and provide log at future appointments -Counseled on diet and exercise extensively Recommended to continue current medication  Hyperlipidemia: (LDL goal < 70) The 10-year ASCVD risk score (Arnett DK, et al., 2019) is: 30.2%   Values used to calculate the score:     Age: 85 years     Sex: Male     Is Non-Hispanic African American: No     Diabetic: Yes      Tobacco smoker: No     Systolic Blood Pressure: 076 mmHg     Is BP treated: Yes     HDL Cholesterol: 55 mg/dL     Total Cholesterol: 151  mg/dL Lab Results  Component Value Date   CHOL 151 03/25/2022   CHOL 136 10/02/2021   CHOL 154 07/23/2021   Lab Results  Component Value Date   HDL 55 03/25/2022   HDL 52 10/02/2021   HDL 58 07/23/2021   Lab Results  Component Value Date   LDLCALC 76 03/25/2022   LDLCALC 62 10/02/2021   LDLCALC 78 07/23/2021   Lab Results  Component Value Date   TRIG 109 03/25/2022   TRIG 124 10/02/2021   TRIG 96 07/23/2021   Lab Results  Component Value Date   CHOLHDL 2.7 03/25/2022   CHOLHDL 2.6 10/02/2021   CHOLHDL 2.7 07/23/2021  No results found for: "LDLDIRECT" -Controlled -Current treatment: Rosuvastatin 40 mg once daily Appropriate, Effective, Safe, Accessible -Reviewed for side effects/tolerability - no issues -Educated on Exercise goal of 150 minutes per week; -Counseled on diet and exercise extensively Recommended to continue current medication  Diabetes (A1c goal <8%) Lab Results  Component Value Date   HGBA1C 7.6 (H) 03/25/2022   HGBA1C 7.8 (H) 12/02/2021   HGBA1C 7.0 (H) 10/09/2021   Lab Results  Component Value Date   MICROALBUR 30 (H) 10/09/2021   LDLCALC 76 03/25/2022   CREATININE 0.75 (L) 03/25/2022   Lab Results  Component Value Date   NA 134 03/25/2022   K 4.7 03/25/2022   CREATININE 0.75 (L) 03/25/2022   EGFR 98 03/25/2022   GFRNONAA 92 05/25/2020   GLUCOSE 279 (H) 03/25/2022   Lab Results  Component Value Date   WBC 8.1 12/02/2021   HGB 15.3 12/02/2021   HCT 44.2 12/02/2021   MCV 92 12/02/2021   PLT 249 12/02/2021  -Controlled -Current medications: Metformin 1000 mg twice daily Appropriate, Effective, Query Safe,  Jardiance 25 mg once daily Appropriate, Effective, Safe, Accessible -Tried/failed: Trulicity -Current home glucose readings Glucose:  August 2023: AM: 140-155 (Pre-prandial) Lunch: 200  (After food) -Denies hypoglycemic/hyperglycemic symptoms -Current meal patterns: occasional sweets, knows to minimize intake of carbs -Current exercise: active outdoors, golf 2x/week.  August 2023: patient working full time and makes too much for PAP. Is able to afford Jardiance. States has diarrhea 4x/week. Recommend Metformin ER trial  Depression/Anxiety (Goal: PHQ9<5) -Controlled -Current treatment: Duloxetine 60mg  Appropriate, Effective, Safe, Accessible -Medications previously tried/failed: N/A -PHQ9:     03/25/2022    9:46 AM 12/09/2021    9:28 AM 09/11/2021   10:22 AM  Depression screen PHQ 2/9  Decreased Interest 0 0 0  Down, Depressed, Hopeless 0 0 0  PHQ - 2 Score 0 0 0  Altered sleeping 0 0 0  Tired, decreased energy 1 0 0  Change in appetite 0 0 0  Feeling bad or failure about yourself  0 0 0  Trouble concentrating 0 0 0  Moving slowly or fidgety/restless 0 0 0  Suicidal thoughts 0 0 0  PHQ-9 Score 1 0 0  Difficult doing work/chores Not difficult at all Not difficult at all   -GAD7:     03/25/2022    9:46 AM 12/09/2021    9:29 AM 09/11/2021   10:22 AM 07/30/2021   10:49 AM  GAD 7 : Generalized Anxiety Score  Nervous, Anxious, on Edge 0 0 0 0  Control/stop worrying 0 0 0 0  Worry too much - different things 0 0 0 0  Trouble relaxing 0 0 0 0  Restless 0 0 0 0  Easily annoyed or irritable 0 0 0 0  Afraid - awful might happen 0 0  0 0  Total GAD 7 Score 0 0 0 0  Anxiety Difficulty Not difficult at all Not difficult at all  Not difficult at all  -Educated on Benefits of medication for symptom control -Recommended to continue current medication   Patient Goals/Self-Care Activities Patient will:  - take medications as prescribed check blood pressure 1-2x/week, document, and provide at future appointments Start taking metformin with meals.  Follow Up Plan: Concierge will check on patient monthly  [place] Constable, Pharm.Keturah Barre - [phone]      Patient's  preferred pharmacy is:  [facility] [address] [place] [address] Phone: [phone] Fax: [place], [place] [address] [place] [address] Phone: [phone] Fax: [place] #[id number] [address], [place] [address] Phone: [phone] Fax: [phone]  [facility] [place], [place] [address] Phone: [phone] Fax: [phone]  [facility] [id number], [place] - [place] [address] Phone: [phone] Fax: [phone]   Follow Up:  Patient agrees to Care Plan and Follow-up.  Plan:  [place] f/u call 6 months.   Future Appointments  Date Time Provider [place]  07/01/2022  9:00 AM Kathrine Haddock, NP [facility]  11/03/2022 11:00 AM CFP [facility] PHARMACY [facility]   [place] Constable, Pharm.D. - [phone]

## 2022-05-09 ENCOUNTER — Other Ambulatory Visit: Payer: Self-pay | Admitting: Family Medicine

## 2022-05-09 MED ORDER — METFORMIN HCL ER 500 MG PO TB24
1000.0000 mg | ORAL_TABLET | Freq: Two times a day (BID) | ORAL | 1 refills | Status: DC
Start: 2022-05-09 — End: 2022-10-09

## 2022-05-19 DIAGNOSIS — H6983 Other specified disorders of Eustachian tube, bilateral: Secondary | ICD-10-CM | POA: Diagnosis not present

## 2022-05-19 DIAGNOSIS — R42 Dizziness and giddiness: Secondary | ICD-10-CM | POA: Diagnosis not present

## 2022-05-19 DIAGNOSIS — H90A32 Mixed conductive and sensorineural hearing loss, unilateral, left ear with restricted hearing on the contralateral side: Secondary | ICD-10-CM | POA: Diagnosis not present

## 2022-05-29 DIAGNOSIS — E785 Hyperlipidemia, unspecified: Secondary | ICD-10-CM

## 2022-05-29 DIAGNOSIS — Z794 Long term (current) use of insulin: Secondary | ICD-10-CM

## 2022-05-29 DIAGNOSIS — I10 Essential (primary) hypertension: Secondary | ICD-10-CM

## 2022-05-29 DIAGNOSIS — E1159 Type 2 diabetes mellitus with other circulatory complications: Secondary | ICD-10-CM

## 2022-05-29 DIAGNOSIS — F32A Depression, unspecified: Secondary | ICD-10-CM

## 2022-06-04 DIAGNOSIS — R42 Dizziness and giddiness: Secondary | ICD-10-CM | POA: Diagnosis not present

## 2022-06-07 ENCOUNTER — Other Ambulatory Visit: Payer: Self-pay | Admitting: Nurse Practitioner

## 2022-06-10 NOTE — Telephone Encounter (Signed)
Requested Prescriptions  Pending Prescriptions Disp Refills  . TRESIBA FLEXTOUCH 100 UNIT/ML FlexTouch Pen [Pharmacy Med Name: TRESIBA FLEXTOUCH 100 UNIT/ML Solution Pen-injector] 15 mL 0    Sig: INJECT 16 UNITS UNDER THE SKIN AT BEDTIME     Endocrinology:  Diabetes - Insulins Passed - 06/07/2022  4:01 AM      Passed - HBA1C is between 0 and 7.9 and within 180 days    Hemoglobin A1C  Date Value Ref Range Status  05/22/2016 7.6  Final   HB A1C (BAYER DCA - WAIVED)  Date Value Ref Range Status  12/02/2021 7.8 (H) 4.8 - 5.6 % Final    Comment:             Prediabetes: 5.7 - 6.4          Diabetes: >6.4          Glycemic control for adults with diabetes: <7.0    Hgb A1c MFr Bld  Date Value Ref Range Status  03/25/2022 7.6 (H) 4.8 - 5.6 % Final    Comment:             Prediabetes: 5.7 - 6.4          Diabetes: >6.4          Glycemic control for adults with diabetes: <7.0          Passed - Valid encounter within last 6 months    Recent Outpatient Visits          2 months ago Hypertension associated with diabetes ([place])   [place], Karen, NP   6 months ago Essential hypertension   [facility] Vigg, Avanti, MD   8 months ago Diabetes mellitus without complication ([place])   [facility] Vigg, Avanti, MD   9 months ago Diabetes mellitus without complication ([place])   [facility] Vigg, Avanti, MD   10 months ago Diabetes mellitus without complication ([place])   [place], Avanti, MD      Future Appointments            In 3 weeks Kathrine Haddock, NP [place]

## 2022-07-01 ENCOUNTER — Encounter: Payer: Self-pay | Admitting: Physician Assistant

## 2022-07-01 ENCOUNTER — Ambulatory Visit (INDEPENDENT_AMBULATORY_CARE_PROVIDER_SITE_OTHER): Payer: Medicare HMO | Admitting: Physician Assistant

## 2022-07-01 VITALS — BP 107/69 | HR 68 | Temp 98.0°F | Wt 203.1 lb

## 2022-07-01 DIAGNOSIS — E785 Hyperlipidemia, unspecified: Secondary | ICD-10-CM

## 2022-07-01 DIAGNOSIS — E1149 Type 2 diabetes mellitus with other diabetic neurological complication: Secondary | ICD-10-CM | POA: Diagnosis not present

## 2022-07-01 DIAGNOSIS — Z23 Encounter for immunization: Secondary | ICD-10-CM | POA: Diagnosis not present

## 2022-07-01 DIAGNOSIS — E1159 Type 2 diabetes mellitus with other circulatory complications: Secondary | ICD-10-CM

## 2022-07-01 DIAGNOSIS — I152 Hypertension secondary to endocrine disorders: Secondary | ICD-10-CM | POA: Diagnosis not present

## 2022-07-01 LAB — BAYER DCA HB A1C WAIVED: HB A1C (BAYER DCA - WAIVED): 7.2 % — ABNORMAL HIGH (ref 4.8–5.6)

## 2022-07-01 MED ORDER — TRESIBA FLEXTOUCH 100 UNIT/ML ~~LOC~~ SOPN: 18.0000 [IU] | PEN_INJECTOR | Freq: Every day | SUBCUTANEOUS | 0 refills | Status: DC

## 2022-07-01 NOTE — Assessment & Plan Note (Signed)
Chronic, historic condition Appears well controlled with current regimen of Amlodipine 5 mg PO QD and Benazepril 20 mg PO QD Continue current medications Follow up in 3 months for monitoring or sooner if concerns arise.

## 2022-07-01 NOTE — Progress Notes (Signed)
Established Patient Office Visit  Name: Mark Allen   MRN: [id number]    DOB: 18-Oct-1950   Date:07/01/2022  Today's Provider: Talitha Givens, MHS, PA-C Introduced myself to the patient as a PA-C and provided education on APPs in clinical practice.         Subjective  Chief Complaint  Chief Complaint  Patient presents with   Hypertension   Hyperlipidemia   Diabetes    Hypertension Pertinent negatives include no blurred vision, chest pain, headaches, malaise/fatigue, palpitations or shortness of breath.  Hyperlipidemia Pertinent negatives include no chest pain or shortness of breath.  Diabetes Pertinent negatives for hypoglycemia include no dizziness or headaches. Pertinent negatives for diabetes include no blurred vision, no chest pain and no polydipsia.      Diabetes, Type 2 - Last A1c 7.6  - Medications: jardiance 25 mg PO QD, metformin 1000 mg PO BID, Tresiba 16 units injection QHS  - Compliance: excellent  - Checking BG at home: Checking twice per day. AM is usually 135-160, PM is usually in 180s range - Diet: he is trying to reduce his sugar intake  - Exercise: Not regularly exercising  - Eye exam: up to date  - Foot exam: up to date? - Microalbumin: Up to date - next due in 2024 - Statin: on statin therapy - PNA vaccine: completed - Denies symptoms of hypoglycemia, polyuria, polydipsia, foot ulcers/trauma  HYPERTENSION / [place] Satisfied with current treatment? yes Duration of hypertension: years BP monitoring frequency: rarely BP range:  BP medication side effects: no Past BP meds: amlodipine and benazepril Duration of hyperlipidemia: years Cholesterol medication side effects: no Cholesterol supplements: none Past cholesterol medications: rosuvastatin (crestor) Medication compliance: excellent compliance Aspirin: no Recent stressors: no Recurrent headaches: no Visual changes: no Palpitations: no Dyspnea: no Chest pain: no Lower  extremity edema: no Dizzy/lightheaded: no     Patient Active Problem List   Diagnosis Date Noted   Essential hypertension 10/09/2021   Pain due to onychomycosis of toenails of both feet 10/03/2021   Peripheral neuropathy 07/01/2021   Tinnitus 07/01/2021   Diabetes mellitus ([place]) 06/18/2021   Vitamin B 12 deficiency 02/28/2021   ED (erectile dysfunction) 09/19/2018   Insomnia 05/29/2015   Type II diabetes mellitus with neurological manifestations ([place])    Hypertension associated with diabetes ([place])    Hyperlipidemia    History of nonmelanoma skin cancer 03/03/2013    Past Surgical History:  Procedure Laterality Date   COLONOSCOPY WITH PROPOFOL N/A 10/22/2018   Procedure: COLONOSCOPY WITH PROPOFOL;  Surgeon: Jonathon Bellows, MD;  Location: [facility] ENDOSCOPY;  Service: Gastroenterology;  Laterality: N/A;   NASAL SINUS SURGERY  2004   REFRACTIVE SURGERY Bilateral    SPLENECTOMY  1963   TONSILLECTOMY      Family History  Problem Relation Age of Onset   Cancer Father     Social History   Tobacco Use   Smoking status: Former    Types: Cigarettes    Quit date: 01/27/1989    Years since quitting: 33.4   Smokeless tobacco: Former    Types: Chew    Quit date: 09/29/1990  Substance Use Topics   Alcohol use: Yes    Alcohol/week: 0.0 standard drinks of alcohol    Comment: rare,none last 24hrs     Current Outpatient Medications:    amLODipine (NORVASC) 5 MG tablet, Take 1 tablet (5 mg total) by mouth daily., Disp: 90 tablet, Rfl: 1   benazepril (  LOTENSIN) 20 MG tablet, Take 1 tablet (20 mg total) by mouth daily., Disp: 90 tablet, Rfl: 1   Blood Glucose Calibration (TRUE METRIX LEVEL 3) High SOLN, , Disp: , Rfl:    Blood Glucose Monitoring Suppl (TRUE METRIX METER) w/Device KIT, USE AS DIRECTED, Disp: 1 kit, Rfl: 1   cyanocobalamin 1000 MCG tablet, Take 1,000 mcg by mouth daily., Disp: , Rfl:    DROPLET PEN NEEDLES 32G X 4 MM MISC, USE 2 (TWO) TIMES DAILY AS NEEDED., Disp: 200 each,  Rfl: 2   DULoxetine (CYMBALTA) 60 MG capsule, TAKE 1 CAPSULE EVERY DAY, Disp: 90 capsule, Rfl: 1   empagliflozin (JARDIANCE) 25 MG TABS tablet, Take 1 tablet (25 mg total) by mouth daily., Disp: 90 tablet, Rfl: 1   metFORMIN (GLUCOPHAGE) 1000 MG tablet, Take 1 tablet (1,000 mg total) by mouth 2 (two) times daily with a meal., Disp: 180 tablet, Rfl: 1   metFORMIN (GLUCOPHAGE-XR) 500 MG 24 hr tablet, Take 2 tablets (1,000 mg total) by mouth 2 (two) times daily., Disp: 360 tablet, Rfl: 1   rosuvastatin (CRESTOR) 40 MG tablet, Take 1 tablet (40 mg total) by mouth daily., Disp: 90 tablet, Rfl: 1   sildenafil (VIAGRA) 100 MG tablet, TAKE 1 TABLET DAILY AS NEEDED FOR ERECTILE DYSFUNCTION, Disp: 6 tablet, Rfl: 11   traZODone (DESYREL) 50 MG tablet, Take 1 tablet (50 mg total) by mouth at bedtime as needed for sleep., Disp: 90 tablet, Rfl: 1   TRESIBA FLEXTOUCH 100 UNIT/ML FlexTouch Pen, INJECT 16 UNITS UNDER THE SKIN AT BEDTIME, Disp: 15 mL, Rfl: 0   TRUE METRIX BLOOD GLUCOSE TEST test strip, CHECK BLOOD SUGAR TWICE DAILY, Disp: 200 strip, Rfl: 1   TRUEplus Lancets 28G MISC, , Disp: , Rfl:   Allergies  Allergen Reactions   Sulfa Antibiotics    Sulfacetamide Sodium     I personally reviewed active problem list, medication list, allergies, health maintenance, notes from last encounter, lab results with the patient/caregiver today.   Review of Systems  Constitutional:  Negative for chills, diaphoresis, fever and malaise/fatigue.  Eyes:  Negative for blurred vision and double vision.  Respiratory:  Negative for shortness of breath and wheezing.   Cardiovascular:  Negative for chest pain, palpitations and leg swelling.  Gastrointestinal:  Positive for diarrhea. Negative for abdominal pain, blood in stool, constipation, heartburn, nausea and vomiting.  Genitourinary:  Positive for frequency.  Neurological:  Positive for tingling (reports tingling in bilateral feet). Negative for dizziness and  headaches.  Endo/Heme/Allergies:  Negative for polydipsia.      Objective  Vitals:   07/01/22 0853  BP: 107/69  Pulse: 68  Temp: 98 F (36.7 C)  TempSrc: Oral  SpO2: 98%  Weight: 203 lb 1.6 oz (92.1 kg)    Body mass index is 29.41 kg/m.  Physical Exam Vitals reviewed.  Constitutional:      General: He is awake.     Appearance: Normal appearance. He is well-developed, well-groomed and overweight.  HENT:     Head: Normocephalic and atraumatic.  Cardiovascular:     Rate and Rhythm: Normal rate and regular rhythm.     Pulses: Normal pulses.          Radial pulses are 2+ on the right side and 2+ on the left side.     Heart sounds: Normal heart sounds. No murmur heard.    No friction rub. No gallop.  Pulmonary:     Effort: Pulmonary effort is normal.  Breath sounds: Normal breath sounds. No decreased air movement. No decreased breath sounds, wheezing, rhonchi or rales.  Musculoskeletal:     Right lower leg: No edema.     Left lower leg: No edema.  Neurological:     Mental Status: He is alert.  Psychiatric:        Behavior: Behavior is cooperative.      Recent Results (from the past 2160 hour(s))  Bayer DCA Hb A1c Waived     Status: Abnormal   Collection Time: 07/01/22  9:30 AM  Result Value Ref Range   HB A1C (BAYER DCA - WAIVED) 7.2 (H) 4.8 - 5.6 %    Comment:          Prediabetes: 5.7 - 6.4          Diabetes: >6.4          Glycemic control for adults with diabetes: <7.0      PHQ2/9:    07/01/2022    9:06 AM 03/25/2022    9:46 AM 12/09/2021    9:28 AM 09/11/2021   10:22 AM 09/02/2021   12:27 PM  Depression screen PHQ 2/9  Decreased Interest 0 0 0 0 0  Down, Depressed, Hopeless 0 0 0 0 0  PHQ - 2 Score 0 0 0 0 0  Altered sleeping 0 0 0 0   Tired, decreased energy 0 1 0 0   Change in appetite 0 0 0 0   Feeling bad or failure about yourself  0 0 0 0   Trouble concentrating 0 0 0 0   Moving slowly or fidgety/restless 0 0 0 0   Suicidal thoughts 0 0  0 0   PHQ-9 Score 0 1 0 0   Difficult doing work/chores  Not difficult at all Not difficult at all  Not difficult at all      Fall Risk:    07/01/2022    9:06 AM 03/25/2022    9:45 AM 12/09/2021    9:28 AM 09/11/2021   10:22 AM 09/02/2021   12:10 PM  Fall Risk   Falls in the past year? 0 0 0 0 0  Number falls in past yr: 0 0 0 0 0  Injury with Fall? 0 0 0 0 0  Risk for fall due to : No Fall Risks No Fall Risks No Fall Risks No Fall Risks   Follow up _0 ;Falls prevention discussed      Functional Status Survey:      Assessment & Plan  Problem List Items Addressed This Visit       Cardiovascular and Mediastinum   Hypertension associated with diabetes ([place])    Chronic, historic condition Appears well controlled with current regimen of Amlodipine 5 mg PO QD and Benazepril 20 mg PO QD Continue current medications Follow up in 3 months for monitoring or sooner if concerns arise.         Endocrine   Type II diabetes mellitus with neurological manifestations ([place]) - Primary    Chronic, historic condition, overall stable  He is taking Metformin 1000 mg PO BID, Jardiance 25 mg PO QD, and Tresiba 16 units injection QHS- appears to be tolerating well He reports Duloxetine is managing his neuropathy at this time Rechecked A1c today - improved to 7.2, but review of home glucose readings demonstrates persistent hyperglycemia.  Will increase Tresiba to 18 units QHS with goal of improving  hyperglycemia and A1c  Follow up in 3 months for monitoring and repeat labs        Relevant Orders   Bayer DCA Hb A1c Waived (Completed)     Other   Hyperlipidemia    Chronic, historic condition Reviewed most recent lipid panel from 02/2022- cholesterol appears well controlled Currently taking Rosuvastatin 40 mg PO QD and appears to be tolerating well Continue  current regimen- encouraged diet and exercise efforts to further assist with management Follow up in 3 months for repeat labs and monitoring       RESOLVED: Need for influenza vaccination   Relevant Orders   Flu Vaccine QUAD High Dose(Fluad) (Completed)     Return in about 3 months (around 10/01/2022) for Diabetes follow up, HTN.   I, Abryana Lykens E Averi Kilty, PA-C, have reviewed all documentation for this visit. The documentation on 07/01/22 for the exam, diagnosis, procedures, and orders are all accurate and complete.   Talitha Givens, MHS, PA-C [facility]

## 2022-07-01 NOTE — Assessment & Plan Note (Signed)
Chronic, historic condition, overall stable  He is taking Metformin 1000 mg PO BID, Jardiance 25 mg PO QD, and Tresiba 16 units injection QHS- appears to be tolerating well He reports Duloxetine is managing his neuropathy at this time Rechecked A1c today - improved to 7.2, but review of home glucose readings demonstrates persistent hyperglycemia.  Will increase Tresiba to 18 units QHS with goal of improving hyperglycemia and A1c  Follow up in 3 months for monitoring and repeat labs

## 2022-07-01 NOTE — Assessment & Plan Note (Signed)
Chronic, historic condition Reviewed most recent lipid panel from 02/2022- cholesterol appears well controlled Currently taking Rosuvastatin 40 mg PO QD and appears to be tolerating well Continue current regimen- encouraged diet and exercise efforts to further assist with management Follow up in 3 months for repeat labs and monitoring

## 2022-07-23 ENCOUNTER — Other Ambulatory Visit: Payer: Self-pay | Admitting: Internal Medicine

## 2022-07-23 MED ORDER — TRUEPLUS LANCETS 28G MISC
1.0000 | Freq: Two times a day (BID) | 11 refills | Status: AC
Start: 2022-07-23 — End: ?

## 2022-07-23 NOTE — Telephone Encounter (Signed)
PT came in and said that he needed to speak to someone about sending a prescription to [facility].  The prescription he is wanting is for the Trueplus Lancets.  I told him that I could sent a message and someone would call regarding this request.

## 2022-07-23 NOTE — Telephone Encounter (Signed)
Medication refill for True plus lancets last ov 07/01/22, upcoming ov 10/01/22 . Please advise

## 2022-08-10 ENCOUNTER — Other Ambulatory Visit: Payer: Self-pay | Admitting: Nurse Practitioner

## 2022-08-10 DIAGNOSIS — G4700 Insomnia, unspecified: Secondary | ICD-10-CM

## 2022-08-10 DIAGNOSIS — E785 Hyperlipidemia, unspecified: Secondary | ICD-10-CM

## 2022-08-11 NOTE — Telephone Encounter (Signed)
Requested medications are due for refill today.  yes  Requested medications are on the active medications list.  yes  Last refill. 03/25/2022 #90 1 rf - for both  Future visit scheduled.   yes  Notes to clinic.  Dr. Neomia Dear listed as PCP.    Requested Prescriptions  Pending Prescriptions Disp Refills   rosuvastatin (CRESTOR) 40 MG tablet [Pharmacy Med Name: ROSUVASTATIN CALCIUM 40 MG Tablet] 90 tablet 10    Sig: TAKE 1 TABLET EVERY DAY     Cardiovascular:  Antilipid - Statins 2 Failed - 08/10/2022  2:44 AM      Failed - Cr in normal range and within 360 days    Creatinine, Ser  Date Value Ref Range Status  03/25/2022 0.75 (L) 0.76 - 1.27 mg/dL Final         Failed - Lipid Panel in normal range within the last 12 months    Cholesterol, Total  Date Value Ref Range Status  03/25/2022 151 100 - 199 mg/dL Final   Cholesterol Piccolo, Waived  Date Value Ref Range Status  03/23/2017 145 <200 mg/dL Final    Comment:                            Desirable                <200                         Borderline High      200- 239                         High                     >239    LDL Chol Calc (NIH)  Date Value Ref Range Status  03/25/2022 76 0 - 99 mg/dL Final   HDL  Date Value Ref Range Status  03/25/2022 55 >39 mg/dL Final   Triglycerides  Date Value Ref Range Status  03/25/2022 109 0 - 149 mg/dL Final   Triglycerides Piccolo,Waived  Date Value Ref Range Status  03/23/2017 74 <150 mg/dL Final    Comment:                            Normal                   <150                         Borderline High     150 - 199                         High                200 - 499                         Very High                >499          Passed - Patient is not pregnant      Passed - Valid encounter within last 12 months    Recent Outpatient Visits  1 month ago Type II diabetes mellitus with neurological manifestations ([place])   [facility]  Mecum, Erin E, PA-C   4 months ago Hypertension associated with diabetes ([place])   [place], Karen, NP   8 months ago Essential hypertension   [place], MD   10 months ago Diabetes mellitus without complication ([place])   [facility] Vigg, Avanti, MD   11 months ago Diabetes mellitus without complication ([place])   Chapin, Avanti, MD       Future Appointments             In 1 month Mecum, [place], PA-C [facility]             traZODone (DESYREL) 50 MG tablet [Pharmacy Med Name: TRAZODONE HYDROCHLORIDE 50 MG Tablet] 90 tablet 10    Sig: Take 1 tablet (50 mg total) by mouth at bedtime as needed for sleep.     Psychiatry: Antidepressants - Serotonin Modulator Passed - 08/10/2022  2:44 AM      Passed - Valid encounter within last 6 months    Recent Outpatient Visits           1 month ago Type II diabetes mellitus with neurological manifestations ([place])   [facility] Mecum, Erin E, PA-C   4 months ago Hypertension associated with diabetes ([place])   Avant, Karen, NP   8 months ago Essential hypertension   [facility] Vigg, Avanti, MD   10 months ago Diabetes mellitus without complication ([place])   [facility] Vigg, Avanti, MD   11 months ago Diabetes mellitus without complication ([place])   [place], MD       Future Appointments             In 1 month Mecum, Erin E, PA-C [employer], [facility]

## 2022-08-12 ENCOUNTER — Other Ambulatory Visit: Payer: Self-pay

## 2022-08-12 DIAGNOSIS — E1149 Type 2 diabetes mellitus with other diabetic neurological complication: Secondary | ICD-10-CM

## 2022-08-12 MED ORDER — TRUE METRIX BLOOD GLUCOSE TEST VI STRP: 1.0000 | ORAL_STRIP | Freq: Two times a day (BID) | 1 refills | Status: DC

## 2022-10-01 ENCOUNTER — Ambulatory Visit: Payer: Medicare HMO | Admitting: Physician Assistant

## 2022-10-08 ENCOUNTER — Other Ambulatory Visit: Payer: Self-pay | Admitting: Nurse Practitioner

## 2022-10-08 DIAGNOSIS — E119 Type 2 diabetes mellitus without complications: Secondary | ICD-10-CM

## 2022-10-08 DIAGNOSIS — I10 Essential (primary) hypertension: Secondary | ICD-10-CM

## 2022-10-08 NOTE — Progress Notes (Unsigned)
There were no vitals taken for this visit.   Subjective:    Patient ID: Mark Allen, male    DOB: 22-Dec-1950, 72 y.o.   MRN: [id number]  HPI: Mark Allen is a 72 y.o. male presenting on 10/09/2022 for comprehensive medical examination. Current medical complaints include:{Blank single:19197::"none","***"}  She currently lives with: Menopausal Symptoms: {Blank single:19197::"yes","no"}  DIABETES Hypoglycemic episodes:no Polydipsia/polyuria: some days Visual disturbance:  recently had cataract surgery Chest pain: no Paresthesias: yes Glucose Monitoring: yes             Accucheck frequency: Daily             Fasting glucose: 120-135             Post prandial:             Evening:             Before meals: Taking Insulin?: yes             Long acting insulin: Tresiba 16u             Short acting insulin: Blood Pressure Monitoring: not checking Retinal Examination: Up to Date Foot Exam: Up to Date Diabetic Education: Not Completed Pneumovax: Up to Date Influenza: Not up to Date Aspirin: no   HYPERTENSION / HYPERLIPIDEMIA Satisfied with current treatment? no Duration of hypertension: years BP monitoring frequency: weekly BP range: 120/80 BP medication side effects: no Past BP meds: amlodipine and benazepril Duration of hyperlipidemia: years Cholesterol medication side effects: no Cholesterol supplements: none Past cholesterol medications: rosuvastatin (crestor) Medication compliance: excellent compliance Aspirin: no Recent stressors: no Recurrent headaches: no Visual changes: no Palpitations: no Dyspnea: no Chest pain: no Lower extremity edema: no Dizzy/lightheaded: no Functional Status Survey:       07/01/2022    9:06 AM 03/25/2022    9:45 AM 12/09/2021    9:28 AM 09/11/2021   10:22 AM 09/02/2021   12:10 PM  Fall Risk   Falls in the past year? 0 0 0 0 0  Number falls in past yr: 0 0 0 0 0  Injury with Fall? 0 0 0 0 0  Risk for fall due to : No Fall  Risks No Fall Risks No Fall Risks No Fall Risks   Follow up Falls evaluation completed Falls evaluation completed Falls evaluation completed Falls evaluation completed Falls evaluation completed;Falls prevention discussed    Depression Screen    07/01/2022    9:06 AM 03/25/2022    9:46 AM 12/09/2021    9:28 AM 09/11/2021   10:22 AM 09/02/2021   12:27 PM  Depression screen PHQ 2/9  Decreased Interest 0 0 0 0 0  Down, Depressed, Hopeless 0 0 0 0 0  PHQ - 2 Score 0 0 0 0 0  Altered sleeping 0 0 0 0   Tired, decreased energy 0 1 0 0   Change in appetite 0 0 0 0   Feeling bad or failure about yourself  0 0 0 0   Trouble concentrating 0 0 0 0   Moving slowly or fidgety/restless 0 0 0 0   Suicidal thoughts 0 0 0 0   PHQ-9 Score 0 1 0 0   Difficult doing work/chores  Not difficult at all Not difficult at all  Not difficult at all     Advanced Directives Does patient have a HCPOA?    {Blank single:19197::"yes","no"} If yes, name and contact information:  Does patient have a living will or  MOST form?  {Blank single:19197::"yes","no"}  Past Medical History:  Past Medical History:  Diagnosis Date   Broken leg 06/2018   Left leg   Calculus of kidney    Diabetes mellitus without complication ([place]) 93/03/9023   Hyperlipidemia    Hypertension    Impotence, organic    Insomnia    Need for shingles vaccine 07/30/2021   Testicular hypofunction    Type II diabetes mellitus with neurological manifestations [facility])     Surgical History:  Past Surgical History:  Procedure Laterality Date   COLONOSCOPY WITH PROPOFOL N/A 10/22/2018   Procedure: COLONOSCOPY WITH PROPOFOL;  Surgeon: Jonathon Bellows, MD;  [facility] ENDOSCOPY;  Service: Gastroenterology;  Laterality: N/A;   NASAL SINUS SURGERY  2004   REFRACTIVE SURGERY Bilateral    SPLENECTOMY  1963   TONSILLECTOMY      Medications:  Current Outpatient Medications on File Prior to Visit  Medication Sig   amLODipine (NORVASC) 5 MG tablet  TAKE 1 TABLET EVERY DAY   benazepril (LOTENSIN) 20 MG tablet TAKE 1 TABLET EVERY DAY   Blood Glucose Calibration (TRUE METRIX LEVEL 3) High SOLN    Blood Glucose Monitoring Suppl (TRUE METRIX METER) w/Device KIT USE AS DIRECTED   cyanocobalamin 1000 MCG tablet Take 1,000 mcg by mouth daily.   DROPLET PEN NEEDLES 32G X 4 MM MISC USE 2 (TWO) TIMES DAILY AS NEEDED.   DULoxetine (CYMBALTA) 60 MG capsule TAKE 1 CAPSULE EVERY DAY   empagliflozin (JARDIANCE) 25 MG TABS tablet Take 1 tablet (25 mg total) by mouth daily.   glucose blood (TRUE METRIX BLOOD GLUCOSE TEST) test strip 1 each by Other route 2 (two) times daily. Use as instructed   insulin degludec (TRESIBA FLEXTOUCH) 100 UNIT/ML FlexTouch Pen Inject 18 Units into the skin at bedtime.   metFORMIN (GLUCOPHAGE) 1000 MG tablet TAKE 1 TABLET (1,000 MG) BY MOUTH 2 TIMES DAILY WITH MEALS   metFORMIN (GLUCOPHAGE-XR) 500 MG 24 hr tablet Take 2 tablets (1,000 mg total) by mouth 2 (two) times daily.   rosuvastatin (CRESTOR) 40 MG tablet Take 1 tablet (40 mg total) by mouth daily.   sildenafil (VIAGRA) 100 MG tablet TAKE 1 TABLET DAILY AS NEEDED FOR ERECTILE DYSFUNCTION   traZODone (DESYREL) 50 MG tablet Take 1 tablet (50 mg total) by mouth at bedtime as needed for sleep.   TRUEplus Lancets 28G MISC 1 each by Other route in the morning and at bedtime.   No current facility-administered medications on file prior to visit.    Allergies:  Allergies  Allergen Reactions   Sulfa Antibiotics    Sulfacetamide Sodium     Social History:  Social History   Socioeconomic History   Marital status: Married    Spouse name: Not on file   Number of children: Not on file   Years of education: Not on file   Highest education level: Not on file  Occupational History   Not on file  Tobacco Use   Smoking status: Former    Types: Cigarettes    Quit date: 01/27/1989    Years since quitting: 33.7   Smokeless tobacco: Former    Types: Chew    Quit date:  09/29/1990  Vaping Use   Vaping Use: Never used  Substance and Sexual Activity   Alcohol use: Yes    Alcohol/week: 0.0 standard drinks of alcohol    Comment: rare,none last 24hrs   Drug use: No   Sexual activity: Yes  Other Topics Concern  Not on file  Social History Narrative   Not on file   Social Determinants of Health   Financial Resource Strain: Low Risk  (05/05/2022)   Overall Financial Resource Strain (CARDIA)    Difficulty of Paying Living Expenses: Not hard at all  Food Insecurity: No Food Insecurity (09/02/2021)   Hunger Vital Sign    Worried About Running Out of Food in the Last Year: Never true    Ran Out of Food in the Last Year: Never true  Transportation Needs: No Transportation Needs (05/05/2022)   PRAPARE - Hydrologist (Medical): No    Lack of Transportation (Non-Medical): No  Physical Activity: Unknown (09/02/2021)   Exercise Vital Sign    Days of Exercise per Week: 5 days    Minutes of Exercise per Session: Not on file  Stress: No Stress Concern Present (09/02/2021)   [place]    Feeling of Stress : Not at all  Social Connections: Moderately Integrated (09/02/2021)   Social Connection and Isolation Panel [NHANES]    Frequency of Communication with Friends and Family: More than three times a week    Frequency of Social Gatherings with Friends and Family: More than three times a week    Attends Religious Services: More than 4 times per year    Active Member of [employer] or Organizations: No    Attends Archivist Meetings: Never    Marital Status: Married  Human resources officer Violence: Not At Risk (09/02/2021)   Humiliation, Afraid, Rape, and Kick questionnaire    Fear of Current or Ex-Partner: No    Emotionally Abused: No    Physically Abused: No    Sexually Abused: No   Social History   Tobacco Use  Smoking Status Former   Types: Cigarettes   Quit date:  01/27/1989   Years since quitting: 33.7  Smokeless Tobacco Former   Types: Chew   Quit date: 09/29/1990   Social History   Substance and Sexual Activity  Alcohol Use Yes   Alcohol/week: 0.0 standard drinks of alcohol   Comment: rare,none last 24hrs    Family History:  Family History  Problem Relation Age of Onset   Cancer Father     Past medical history, surgical history, medications, allergies, family history and social history reviewed with patient today and changes made to appropriate areas of the chart.   ROS  All other ROS negative except what is listed above and in the HPI.      Objective:    There were no vitals taken for this visit.  Wt Readings from Last 3 Encounters:  07/01/22 203 lb 1.6 oz (92.1 kg)  03/25/22 203 lb 6.4 oz (92.3 kg)  12/09/21 200 lb (90.7 kg)    No results found.  Physical Exam     08/31/2020    2:38 PM  6CIT Screen  What Year? 0 points  What month? 0 points  What time? 0 points  Count back from 20 0 points  Months in reverse 0 points  Repeat phrase 0 points  Total Score 0 points    Cognitive Testing - 6-CIT  Correct? Score   What year is it? {YES NO:22349} {Numbers; 0-4:31231} Yes = 0    No = 4  What month is it? {YES NO:22349} {Numbers; 0-4:31231} Yes = 0    No = 3  Remember:     Pia [place]au, [place], [place]  What time is it? {YES NO:22349} {Numbers; 0-4:31231} Yes = 0    No = 3  Count backwards from 20 to 1 {YES NO:22349} {Numbers; 0-4:31231} Correct = 0    1 error = 2   More than 1 error = 4  Say the months of the year in reverse. {YES NO:22349} {Numbers; 0-4:31231} Correct = 0    1 error = 2   More than 1 error = 4  What address did I ask you to remember? {YES NO:22349} {NUMBERS; 0-10:5044} Correct = 0  1 error = 2    2 error = 4    3 error = 6    4 error = 8    All wrong = 10       TOTAL SCORE  {Numbers; 2-40:97353}/29   Interpretation:  {Desc; normal/abnormal:11317::"Normal"}  Normal (0-7) Abnormal (8-28)    Results for orders placed or performed in visit on 07/01/22  Bayer DCA Hb A1c Waived  Result Value Ref Range   HB A1C (BAYER DCA - WAIVED) 7.2 (H) 4.8 - 5.6 %      Assessment & Plan:   Problem List Items Addressed This Visit       Cardiovascular and Mediastinum   Hypertension associated with diabetes ([place])   Essential hypertension - Primary     Endocrine   Type II diabetes mellitus with neurological manifestations (HCC)   Diabetes mellitus ([place])     Other   Hyperlipidemia   Vitamin B 12 deficiency     Preventative Services:  AAA screening:  Health Risk Assessment and Personalized Prevention Plan: Bone Mass Measurements: Breast Cancer Screening: CVD Screening:  Cervical Cancer Screening: Colon Cancer Screening:  Depression Screening:  Diabetes Screening:  Glaucoma Screening:  Hepatitis B vaccine: Hepatitis C screening:  HIV Screening: Flu Vaccine: Lung cancer Screening: Obesity Screening:  Pneumonia Vaccines (2): STI Screening:  Follow up plan: No follow-ups on file.   LABORATORY TESTING:  - Pap smear: {Blank JMEQAS:34196::"QIW done","not applicable","up to date","done elsewhere"}  IMMUNIZATIONS:   - Tdap: Tetanus vaccination status reviewed: {tetanus status:315746}. - Influenza: {Blank single:19197::"Up to date","Administered today","Postponed to flu season","Refused","Given elsewhere"} - Pneumovax: {Blank single:19197::"Up to date","Administered today","Not applicable","Refused","Given elsewhere"} - Prevnar: {Blank single:19197::"Up to date","Administered today","Not applicable","Refused","Given elsewhere"} - Zostavax vaccine: {Blank single:19197::"Up to date","Administered today","Not applicable","Refused","Given elsewhere"}  SCREENING: -Mammogram: {Blank single:19197::"Up to date","Ordered today","Not applicable","Refused","Done elsewhere"}  - Colonoscopy: {Blank single:19197::"Up to date","Ordered today","Not applicable","Refused","Done elsewhere"}   - Bone Density: {Blank single:19197::"Up to date","Ordered today","Not applicable","Refused","Done elsewhere"}  -Hearing Test: {Blank single:19197::"Up to date","Ordered today","Not applicable","Refused","Done elsewhere"}  -Spirometry: {Blank single:19197::"Up to date","Ordered today","Not applicable","Refused","Done elsewhere"}   PATIENT COUNSELING:   Advised to take 1 mg of folate supplement per day if capable of pregnancy.   Sexuality: Discussed sexually transmitted diseases, partner selection, use of condoms, avoidance of unintended pregnancy  and contraceptive alternatives.   Advised to avoid cigarette smoking.  I discussed with the patient that most people either abstain from alcohol or drink within safe limits (<=14/week and <=4 drinks/occasion for males, <=7/weeks and <= 3 drinks/occasion for females) and that the risk for alcohol disorders and other health effects rises proportionally with the number of drinks per week and how often a drinker exceeds daily limits.  Discussed cessation/primary prevention of drug use and availability of treatment for abuse.   Diet: Encouraged to adjust caloric intake to maintain  or achieve ideal body weight, to reduce intake of dietary saturated fat and total fat, to limit sodium intake by avoiding high sodium  foods and not adding table salt, and to maintain adequate dietary potassium and calcium preferably from fresh fruits, vegetables, and low-fat dairy products.    stressed the importance of regular exercise  Injury prevention: Discussed safety belts, safety helmets, smoke detector, smoking near bedding or upholstery.   Dental health: Discussed importance of regular tooth brushing, flossing, and dental visits.    NEXT PREVENTATIVE PHYSICAL DUE IN 1 YEAR. No follow-ups on file.

## 2022-10-08 NOTE — Telephone Encounter (Signed)
Requested Prescriptions  Pending Prescriptions Disp Refills   metFORMIN (GLUCOPHAGE) 1000 MG tablet [Pharmacy Med Name: METFORMIN HYDROCHLORIDE 1000 MG Tablet] 180 tablet 0    Sig: TAKE 1 TABLET (1,000 MG) BY MOUTH 2 TIMES DAILY WITH MEALS     Endocrinology:  Diabetes - Biguanides Failed - 10/08/2022  4:05 AM      Failed - Cr in normal range and within 360 days    Creatinine, Ser  Date Value Ref Range Status  03/25/2022 0.75 (L) 0.76 - 1.27 mg/dL Final         Failed - CBC within normal limits and completed in the last 12 months    WBC  Date Value Ref Range Status  12/02/2021 8.1 3.4 - 10.8 x10E3/uL Final   RBC  Date Value Ref Range Status  12/02/2021 4.81 4.14 - 5.80 x10E6/uL Final   Hemoglobin  Date Value Ref Range Status  12/02/2021 15.3 13.0 - 17.7 g/dL Final   Hematocrit  Date Value Ref Range Status  12/02/2021 44.2 37.5 - 51.0 % Final   MCHC  Date Value Ref Range Status  12/02/2021 34.6 31.5 - 35.7 g/dL Final   [facility]  Date Value Ref Range Status  12/02/2021 31.8 26.6 - 33.0 pg Final   MCV  Date Value Ref Range Status  12/02/2021 92 79 - 97 fL Final   No results found for: "PLTCOUNTKUC", "LABPLAT", "POCPLA" RDW  Date Value Ref Range Status  12/02/2021 13.1 11.6 - 15.4 % Final         Passed - HBA1C is between 0 and 7.9 and within 180 days    Hemoglobin A1C  Date Value Ref Range Status  05/22/2016 7.6  Final   HB A1C (BAYER DCA - WAIVED)  Date Value Ref Range Status  07/01/2022 7.2 (H) 4.8 - 5.6 % Final    Comment:             Prediabetes: 5.7 - 6.4          Diabetes: >6.4          Glycemic control for adults with diabetes: <7.0          Passed - eGFR in normal range and within 360 days    GFR calc Af Amer  Date Value Ref Range Status  05/25/2020 107 >59 mL/min/1.73 Final    Comment:    **[facility] currently reports eGFR in compliance with the current**   recommendations of the [employer]. [facility] will   update reporting as new  guidelines are published from the NKF-ASN   Task force.    GFR calc non Af Amer  Date Value Ref Range Status  05/25/2020 92 >59 mL/min/1.73 Final   eGFR  Date Value Ref Range Status  03/25/2022 98 >59 mL/min/1.73 Final         Passed - B12 Level in normal range and within 720 days    Vitamin B-12  Date Value Ref Range Status  03/25/2022 697 232 - 1,245 pg/mL Final         Passed - Valid encounter within last 6 months    Recent Outpatient Visits           3 months ago Type II diabetes mellitus with neurological manifestations ([place])   [facility] Mecum, Erin E, PA-C   6 months ago Hypertension associated with diabetes ([place])   [place], Karen, NP   10 months ago Essential hypertension   [employer], Customer service manager,  MD   12 months ago Diabetes mellitus without complication ([place])   [facility] Vigg, Avanti, MD   1 year ago Diabetes mellitus without complication ([place])   [place], Avanti, MD       Future Appointments             Tomorrow Jon Billings, NP [place], [facility]             benazepril (LOTENSIN) 20 MG tablet [Pharmacy Med Name: BENAZEPRIL HYDROCHLORIDE 20 MG Tablet] 90 tablet 0    Sig: TAKE 1 TABLET EVERY DAY     Cardiovascular:  ACE Inhibitors Failed - 10/08/2022  4:05 AM      Failed - Cr in normal range and within 180 days    Creatinine, Ser  Date Value Ref Range Status  03/25/2022 0.75 (L) 0.76 - 1.27 mg/dL Final         Failed - K in normal range and within 180 days    Potassium  Date Value Ref Range Status  03/25/2022 4.7 3.5 - 5.2 mmol/L Final         Passed - Patient is not pregnant      Passed - Last BP in normal range    BP Readings from Last 1 Encounters:  07/01/22 107/69         Passed - Valid encounter within last 6 months    Recent Outpatient Visits           3 months ago Type II diabetes mellitus with neurological manifestations  ([place])   [facility] Mecum, Erin E, PA-C   6 months ago Hypertension associated with diabetes ([place])   [place], Karen, NP   10 months ago Essential hypertension   [facility] Vigg, Avanti, MD   12 months ago Diabetes mellitus without complication ([place])   [facility] Vigg, Avanti, MD   1 year ago Diabetes mellitus without complication ([place])   [place], Avanti, MD       Future Appointments             Tomorrow Jon Billings, NP [place], [facility]             amLODipine (NORVASC) 5 MG tablet [Pharmacy Med Name: AMLODIPINE BESYLATE 5 MG Tablet] 90 tablet 0    Sig: TAKE 1 TABLET EVERY DAY     Cardiovascular: Calcium Channel Blockers 2 Passed - 10/08/2022  4:05 AM      Passed - Last BP in normal range    BP Readings from Last 1 Encounters:  07/01/22 107/69         Passed - Last Heart Rate in normal range    Pulse Readings from Last 1 Encounters:  07/01/22 68         Passed - Valid encounter within last 6 months    Recent Outpatient Visits           3 months ago Type II diabetes mellitus with neurological manifestations ([place])   [facility] Mecum, Erin E, PA-C   6 months ago Hypertension associated with diabetes ([place])   [place], Karen, NP   10 months ago Essential hypertension   [facility] Vigg, Avanti, MD   12 months ago Diabetes mellitus without complication ([place])   [facility] Vigg, Avanti, MD   1 year ago Diabetes mellitus without complication ([facility])   [facility] Vigg, Avanti, MD  Future Appointments             Tomorrow Jon Billings, NP [facility], [facility]             DULoxetine (CYMBALTA) 60 MG capsule [Pharmacy Med Name: DULOXETINE HYDROCHLORIDE 60 MG Capsule Delayed Release Particles] 90 capsule 0    Sig: TAKE [place] DAY     Psychiatry:  Antidepressants - SNRI - duloxetine Failed - 10/08/2022  4:05 AM      Failed - Cr in normal range and within 360 days    Creatinine, Ser  Date Value Ref Range Status  03/25/2022 0.75 (L) 0.76 - 1.27 mg/dL Final         Passed - eGFR is 30 or above and within 360 days    GFR calc Af Amer  Date Value Ref Range Status  05/25/2020 107 >59 mL/min/1.73 Final    Comment:    **[facility] currently reports eGFR in compliance with the current**   recommendations of the [employer]. [facility] will   update reporting as new guidelines are published from the NKF-ASN   Task force.    GFR calc non Af Amer  Date Value Ref Range Status  05/25/2020 92 >59 mL/min/1.73 Final   eGFR  Date Value Ref Range Status  03/25/2022 98 >59 mL/min/1.73 Final         Passed - Completed PHQ-2 or PHQ-9 in the last 360 days      Passed - Last BP in normal range    BP Readings from Last 1 Encounters:  07/01/22 107/69         Passed - Valid encounter within last 6 months    Recent Outpatient Visits           3 months ago Type II diabetes mellitus with neurological manifestations ([place])   [facility] Mecum, Erin E, PA-C   6 months ago Hypertension associated with diabetes ([place])   [place], Karen, NP   10 months ago Essential hypertension   [place] Vigg, Avanti, MD   12 months ago Diabetes mellitus without complication ([place])   [facility] Vigg, Avanti, MD   1 year ago Diabetes mellitus without complication ([place])   [place], Avanti, MD       Future Appointments             Tomorrow Jon Billings, NP [place], [facility]

## 2022-10-09 ENCOUNTER — Ambulatory Visit (INDEPENDENT_AMBULATORY_CARE_PROVIDER_SITE_OTHER): Payer: Medicare HMO | Admitting: Nurse Practitioner

## 2022-10-09 ENCOUNTER — Encounter: Payer: Self-pay | Admitting: Nurse Practitioner

## 2022-10-09 VITALS — BP 115/70 | HR 62 | Temp 97.8°F | Ht 69.5 in | Wt 205.8 lb

## 2022-10-09 DIAGNOSIS — E538 Deficiency of other specified B group vitamins: Secondary | ICD-10-CM

## 2022-10-09 DIAGNOSIS — E1159 Type 2 diabetes mellitus with other circulatory complications: Secondary | ICD-10-CM

## 2022-10-09 DIAGNOSIS — E1149 Type 2 diabetes mellitus with other diabetic neurological complication: Secondary | ICD-10-CM

## 2022-10-09 DIAGNOSIS — Z87891 Personal history of nicotine dependence: Secondary | ICD-10-CM | POA: Diagnosis not present

## 2022-10-09 DIAGNOSIS — E1369 Other specified diabetes mellitus with other specified complication: Secondary | ICD-10-CM | POA: Diagnosis not present

## 2022-10-09 DIAGNOSIS — Z794 Long term (current) use of insulin: Secondary | ICD-10-CM

## 2022-10-09 DIAGNOSIS — Z0000 Encounter for general adult medical examination without abnormal findings: Secondary | ICD-10-CM | POA: Diagnosis not present

## 2022-10-09 DIAGNOSIS — Z7189 Other specified counseling: Secondary | ICD-10-CM

## 2022-10-09 DIAGNOSIS — I10 Essential (primary) hypertension: Secondary | ICD-10-CM | POA: Diagnosis not present

## 2022-10-09 DIAGNOSIS — I152 Hypertension secondary to endocrine disorders: Secondary | ICD-10-CM | POA: Diagnosis not present

## 2022-10-09 DIAGNOSIS — E119 Type 2 diabetes mellitus without complications: Secondary | ICD-10-CM

## 2022-10-09 DIAGNOSIS — E785 Hyperlipidemia, unspecified: Secondary | ICD-10-CM | POA: Diagnosis not present

## 2022-10-09 DIAGNOSIS — Z7984 Long term (current) use of oral hypoglycemic drugs: Secondary | ICD-10-CM

## 2022-10-09 LAB — URINALYSIS, ROUTINE W REFLEX MICROSCOPIC
Bilirubin, UA: NEGATIVE
Leukocytes,UA: NEGATIVE
Nitrite, UA: NEGATIVE
Protein,UA: NEGATIVE
RBC, UA: NEGATIVE
Specific Gravity, UA: 1.015 (ref 1.005–1.030)
Urobilinogen, Ur: 2 mg/dL — ABNORMAL HIGH (ref 0.2–1.0)
pH, UA: 6 (ref 5.0–7.5)

## 2022-10-09 LAB — MICROALBUMIN, URINE WAIVED
Creatinine, Urine Waived: 50 mg/dL (ref 10–300)
Microalb, Ur Waived: 10 mg/L (ref 0–19)

## 2022-10-09 MED ORDER — BENAZEPRIL HCL 20 MG PO TABS: 20.0000 mg | ORAL_TABLET | Freq: Every day | ORAL | 1 refills | Status: DC

## 2022-10-09 MED ORDER — AMLODIPINE BESYLATE 5 MG PO TABS: 5.0000 mg | ORAL_TABLET | Freq: Every day | ORAL | 1 refills | Status: DC

## 2022-10-09 NOTE — Progress Notes (Signed)
BP 115/70   Pulse 62   Temp 97.8 F (36.6 C) (Oral)   Ht 5' 9.5" (1.765 m)   Wt 205 lb 12.8 oz (93.4 kg)   SpO2 95%   BMI 29.96 kg/m    Subjective:    Patient ID: Mark Allen, male    DOB: [birth year], 72 y.o.   MRN: [id number]  HPI: Mark Allen is a 72 y.o. male presenting on 10/09/2022 for comprehensive medical examination. Current medical complaints include:none  He currently lives with: Interim Problems from his last visit: no  DIABETES Hypoglycemic episodes: occasional Polydipsia/polyuria: some days Visual disturbance: no Chest pain: no Paresthesias: yes Glucose Monitoring: yes             Accucheck frequency: Daily             Fasting glucose: 120-140             Post prandial:             Evening:             Before meals: Taking Insulin?: yes             Long acting insulin: Tresiba 18u             Short acting insulin: Blood Pressure Monitoring: not checking Retinal Examination: Up to Date Foot Exam: Up to Date Diabetic Education: Not Completed Pneumovax: Up to Date Influenza: Not up to Date Aspirin: no   HYPERTENSION / HYPERLIPIDEMIA Satisfied with current treatment? no Duration of hypertension: years BP monitoring frequency: not checking BP range:  BP medication side effects: no Past BP meds: amlodipine and benazepril Duration of hyperlipidemia: years Cholesterol medication side effects: no Cholesterol supplements: none Past cholesterol medications: rosuvastatin (crestor) Medication compliance: excellent compliance Aspirin: no Recent stressors: no Recurrent headaches: no Visual changes: no Palpitations: no Dyspnea: no Chest pain: no Lower extremity edema: no Dizzy/lightheaded: no   Functional Status Survey: Is the patient deaf or have difficulty hearing?: Yes Does the patient have difficulty seeing, even when wearing glasses/contacts?: No Does the patient have difficulty concentrating, remembering, or making decisions?: No Does the  patient have difficulty walking or climbing stairs?: No Does the patient have difficulty dressing or bathing?: No Does the patient have difficulty doing errands alone such as visiting a doctor's office or shopping?: No  FALL RISK:    10/09/2022   10:24 AM 07/01/2022    9:06 AM 03/25/2022    9:45 AM 12/09/2021    9:28 AM 09/11/2021   10:22 AM  Fall Risk   Falls in the past year? 0 0 0 0 0  Number falls in past yr: 0 0 0 0 0  Injury with Fall? 0 0 0 0 0  Risk for fall due to : No Fall Risks No Fall Risks No Fall Risks No Fall Risks No Fall Risks  Follow up Falls evaluation completed Falls evaluation completed Falls evaluation completed Falls evaluation completed Falls evaluation completed    Depression Screen    10/09/2022   10:24 AM 07/01/2022    9:06 AM 03/25/2022    9:46 AM 12/09/2021    9:28 AM 09/11/2021   10:22 AM  Depression screen PHQ 2/9  Decreased Interest 0 0 0 0 0  Down, Depressed, Hopeless 0 0 0 0 0  PHQ - 2 Score 0 0 0 0 0  Altered sleeping 0 0 0 0 0  Tired, decreased energy 0 0 1 0 0  Change in  appetite 0 0 0 0 0  Feeling bad or failure about yourself  0 0 0 0 0  Trouble concentrating 0 0 0 0 0  Moving slowly or fidgety/restless 0 0 0 0 0  Suicidal thoughts 0 0 0 0 0  PHQ-9 Score 0 0 1 0 0  Difficult doing work/chores Not difficult at all  Not difficult at all Not difficult at all     Advanced Directives Has an Advance directive.  Does not wish to make any changes.   Past Medical History:  Past Medical History:  Diagnosis Date   Broken leg 06/2018   Left leg   Calculus of kidney    Diabetes mellitus without complication ([place]) 84/09/3242   Hyperlipidemia    Hypertension    Impotence, organic    Insomnia    Need for shingles vaccine 07/30/2021   Testicular hypofunction    Type II diabetes mellitus with neurological manifestations [facility])     Surgical History:  Past Surgical History:  Procedure Laterality Date   COLONOSCOPY WITH PROPOFOL N/A 10/22/2018    Procedure: COLONOSCOPY WITH PROPOFOL;  Surgeon: Jonathon Bellows, MD;  Location: [facility] ENDOSCOPY;  Service: Gastroenterology;  Laterality: N/A;   NASAL SINUS SURGERY  2004   REFRACTIVE SURGERY Bilateral    SPLENECTOMY  1963   TONSILLECTOMY      Medications:  Current Outpatient Medications on File Prior to Visit  Medication Sig   Blood Glucose Calibration (TRUE METRIX LEVEL 3) High SOLN    Blood Glucose Monitoring Suppl (TRUE METRIX METER) w/Device KIT USE AS DIRECTED   cyanocobalamin 1000 MCG tablet Take 1,000 mcg by mouth daily.   DROPLET PEN NEEDLES 32G X 4 MM MISC USE 2 (TWO) TIMES DAILY AS NEEDED.   DULoxetine (CYMBALTA) 60 MG capsule TAKE 1 CAPSULE EVERY DAY   empagliflozin (JARDIANCE) 25 MG TABS tablet Take 1 tablet (25 mg total) by mouth daily.   glucose blood (TRUE METRIX BLOOD GLUCOSE TEST) test strip 1 each by Other route 2 (two) times daily. Use as instructed   insulin degludec (TRESIBA FLEXTOUCH) 100 UNIT/ML FlexTouch Pen Inject 18 Units into the skin at bedtime.   metFORMIN (GLUCOPHAGE) 1000 MG tablet TAKE 1 TABLET (1,000 MG) BY MOUTH 2 TIMES DAILY WITH MEALS   rosuvastatin (CRESTOR) 40 MG tablet Take 1 tablet (40 mg total) by mouth daily.   traZODone (DESYREL) 50 MG tablet Take 1 tablet (50 mg total) by mouth at bedtime as needed for sleep.   TRUEplus Lancets 28G MISC 1 each by Other route in the morning and at bedtime.   No current facility-administered medications on file prior to visit.    Allergies:  Allergies  Allergen Reactions   Sulfa Antibiotics    Sulfacetamide Sodium     Social History:  Social History   Socioeconomic History   Marital status: Married    Spouse name: Not on file   Number of children: Not on file   Years of education: Not on file   Highest education level: Not on file  Occupational History   Not on file  Tobacco Use   Smoking status: Former    Types: Cigarettes    Quit date: 01/27/1989    Years since quitting: 33.7   Smokeless tobacco:  Former    Types: Chew    Quit date: 09/29/1990  Vaping Use   Vaping Use: Never used  Substance and Sexual Activity   Alcohol use: Yes    Alcohol/week: 0.0 standard drinks of alcohol  Comment: rare,none last 24hrs   Drug use: No   Sexual activity: Yes  Other Topics Concern   Not on file  Social History Narrative   Not on file   Social Determinants of Health   Financial Resource Strain: Low Risk  (05/05/2022)   Overall Financial Resource Strain (CARDIA)    Difficulty of Paying Living Expenses: Not hard at all  Food Insecurity: No Food Insecurity (09/02/2021)   Hunger Vital Sign    Worried About Running Out of Food in the Last Year: Never true    Ran Out of Food in the Last Year: Never true  Transportation Needs: No Transportation Needs (05/05/2022)   PRAPARE - Hydrologist (Medical): No    Lack of Transportation (Non-Medical): No  Physical Activity: Unknown (09/02/2021)   Exercise Vital Sign    Days of Exercise per Week: 5 days    Minutes of Exercise per Session: Not on file  Stress: No Stress Concern Present (09/02/2021)   [place]    Feeling of Stress : Not at all  Social Connections: Moderately Integrated (09/02/2021)   Social Connection and Isolation Panel [NHANES]    Frequency of Communication with Friends and Family: More than three times a week    Frequency of Social Gatherings with Friends and Family: More than three times a week    Attends Religious Services: More than 4 times per year    Active Member of [employer] or Organizations: No    Attends Archivist Meetings: Never    Marital Status: Married  Human resources officer Violence: Not At Risk (09/02/2021)   Humiliation, Afraid, Rape, and Kick questionnaire    Fear of Current or Ex-Partner: No    Emotionally Abused: No    Physically Abused: No    Sexually Abused: No   Social History   Tobacco Use  Smoking Status Former    Types: Cigarettes   Quit date: 01/27/1989   Years since quitting: 33.7  Smokeless Tobacco Former   Types: Chew   Quit date: 09/29/1990   Social History   Substance and Sexual Activity  Alcohol Use Yes   Alcohol/week: 0.0 standard drinks of alcohol   Comment: rare,none last 24hrs    Family History:  Family History  Problem Relation Age of Onset   Cancer Father     Past medical history, surgical history, medications, allergies, family history and social history reviewed with patient today and changes made to appropriate areas of the chart.   Review of Systems  HENT:         Denies vision changes.  Eyes:  Negative for blurred vision and double vision.  Respiratory:  Negative for shortness of breath.   Cardiovascular:  Negative for chest pain, palpitations and leg swelling.  Neurological:  Negative for dizziness, tingling and headaches.  Endo/Heme/Allergies:  Negative for polydipsia.       Denies Polyuria   All other ROS negative except what is listed above and in the HPI.      Objective:    BP 115/70   Pulse 62   Temp 97.8 F (36.6 C) (Oral)   Ht 5' 9.5" (1.765 m)   Wt 205 lb 12.8 oz (93.4 kg)   SpO2 95%   BMI 29.96 kg/m   Wt Readings from Last 3 Encounters:  10/09/22 205 lb 12.8 oz (93.4 kg)  07/01/22 203 lb 1.6 oz (92.1 kg)  03/25/22 203 lb 6.4 oz (  92.3 kg)    No results found.  Physical Exam Vitals and nursing note reviewed.  Constitutional:      General: He is not in acute distress.    Appearance: Normal appearance. He is not ill-appearing, toxic-appearing or diaphoretic.  HENT:     Head: Normocephalic.     Right Ear: Tympanic membrane, ear canal and external ear normal.     Left Ear: Tympanic membrane, ear canal and external ear normal.     Nose: Nose normal. No congestion or rhinorrhea.     Mouth/Throat:     Mouth: Mucous membranes are moist.  Eyes:     General:        Right eye: No discharge.        Left eye: No discharge.     Extraocular  Movements: Extraocular movements intact.     Conjunctiva/sclera: Conjunctivae normal.     Pupils: Pupils are equal, round, and reactive to light.  Cardiovascular:     Rate and Rhythm: Normal rate and regular rhythm.     Heart sounds: No murmur heard. Pulmonary:     Effort: Pulmonary effort is normal. No respiratory distress.     Breath sounds: Normal breath sounds. No wheezing, rhonchi or rales.  Abdominal:     General: Abdomen is flat. Bowel sounds are normal. There is no distension.     Palpations: Abdomen is soft.     Tenderness: There is no abdominal tenderness. There is no guarding.  Musculoskeletal:     Cervical back: Normal range of motion and neck supple.  Skin:    General: Skin is warm and dry.     Capillary Refill: Capillary refill takes less than 2 seconds.  Neurological:     General: No focal deficit present.     Mental Status: He is alert and oriented to person, place, and time.     Cranial Nerves: No cranial nerve deficit.     Motor: No weakness.     Deep Tendon Reflexes: Reflexes normal.  Psychiatric:        Mood and Affect: Mood normal.        Behavior: Behavior normal.        Thought Content: Thought content normal.        Judgment: Judgment normal.        08/31/2020    2:38 PM  6CIT Screen  What Year? 0 points  What month? 0 points  What time? 0 points  Count back from 20 0 points  Months in reverse 0 points  Repeat phrase 0 points  Total Score 0 points    Cognitive Testing - 6-CIT  Correct? Score   What year is it? yes 0 Yes = 0    No = 4  What month is it? yes 0 Yes = 0    No = 3  Remember:     Pia [place]au, [place], [place]     What time is it? yes 0 Yes = 0    No = 3  Count backwards from 20 to 1 yes 0 Correct = 0    1 error = 2   More than 1 error = 4  Say the months of the year in reverse. yes 0 Correct = 0    1 error = 2   More than 1 error = 4  What address did I ask you to remember? no 2 Correct = 0  1 error = 2    2 error =  4    3  error = 6    4 error = 8    All wrong = 10       TOTAL SCORE  2/28   Interpretation:  Normal  Normal (0-7) Abnormal (8-28)    Results for orders placed or performed in visit on 07/01/22  Bayer DCA Hb A1c Waived  Result Value Ref Range   HB A1C (BAYER DCA - WAIVED) 7.2 (H) 4.8 - 5.6 %      Assessment & Plan:   Problem List Items Addressed This Visit       Cardiovascular and Mediastinum   Hypertension associated with diabetes ([place])    Chronic.  Controlled.  Continue with current medication regimen on Amlodipine '5mg'$  and Benzapril '40mg'$  daily.  Labs ordered today.  Refills sent today.  Return to clinic in 5 months for reevaluation.  Call sooner if concerns arise.  Continue to check blood pressures at home.       Relevant Medications   amLODipine (NORVASC) 5 MG tablet   benazepril (LOTENSIN) 20 MG tablet   Essential hypertension    Chronic.  Controlled.  Continue with current medication regimen on Amlodipine '5mg'$  and Benzapril '40mg'$  daily.  Labs ordered today.  Refills sent today.  Return to clinic in 5 months for reevaluation.  Call sooner if concerns arise.        Relevant Medications   amLODipine (NORVASC) 5 MG tablet   benazepril (LOTENSIN) 20 MG tablet     Endocrine   Type II diabetes mellitus with neurological manifestations (HCC)    Chronic, stable. Currently taking 18 units tresiba daily, jardiance '25mg'$ ,and  metformin 1,'000mg'$  BID. A1C today is 7.6% . Sees improvement with Duloxetine daily.  Continue current regimen. Refills sent to the pharmacy. Follow-up in 3 months.       Relevant Medications   benazepril (LOTENSIN) 20 MG tablet   Other Relevant Orders   HgB A1c   Microalbumin, Urine Waived   Diabetes mellitus (HCC)    Chronic, stable. Currently taking 18 units tresiba daily, jardiance '25mg'$ ,and  metformin 1,'000mg'$  BID. A1C today is 7.6% . Sees improvement with Duloxetine daily.  Continue current regimen. Refills sent to the pharmacy. Follow-up in 3 months.        Relevant Medications   benazepril (LOTENSIN) 20 MG tablet     Other   Hyperlipidemia    Chronic.  Controlled.  Continue with current medication regimen of Crestor '40mg'$  daily.  Refills sent today.  Labs ordered today.  Return to clinic in 5 months for reevaluation.  Call sooner if concerns arise.        Relevant Medications   amLODipine (NORVASC) 5 MG tablet   benazepril (LOTENSIN) 20 MG tablet   Vitamin B 12 deficiency    Labs ordered at visit today.  Will make recommendations based on lab results.        Relevant Orders   B12   Advanced care planning/counseling discussion    A voluntary discussion about advance care planning including the explanation and discussion of advance directives was extensively discussed  with the patient for 10 minutes with patient.  Explanation about the health care proxy and Living will was reviewed and packet with forms with explanation of how to fill them out was given.  During this discussion, the patient was able to identify a health care proxy as his wife and already has the paperwork filled out and does not wish to make any changes.  Other Visit Diagnoses     Encounter for annual wellness exam in Medicare patient    -  Primary   Annual physical exam       Health maintenance reviewed during visit today.  Labs ordered.  Vaccines up to date.  Colonoscopy up to date.   Relevant Orders   TSH   PSA   Lipid panel   CBC with Differential/Platelet   Comprehensive metabolic panel   Urinalysis, Routine w reflex microscopic   Diabetes mellitus without complication (HCC)       Relevant Medications   benazepril (LOTENSIN) 20 MG tablet        Preventative Services:  Health Risk Assessment and Personalized Prevention Plan: Up to date Bone Mass Measurements: NA CVD Screening: Up to date Colon Cancer Screening: Up to date Depression Screening: Up to date Diabetes Screening: Up to date Glaucoma Screening: Up to date Hepatitis B vaccine:  NA Hepatitis C screening: Up to date HIV Screening: Up to date Flu Vaccine: Up to date Lung cancer Screening: NA Obesity Screening: Up to date Pneumonia Vaccines (2): Up to date STI Screening: NA PSA screening: UP to date  Discussed aspirin prophylaxis for myocardial infarction prevention and decision was it was not indicated  LABORATORY TESTING:  Health maintenance labs ordered today as discussed above.   The natural history of prostate cancer and ongoing controversy regarding screening and potential treatment outcomes of prostate cancer has been discussed with the patient. The meaning of a false positive PSA and a false negative PSA has been discussed. He indicates understanding of the limitations of this screening test and wishes to proceed with screening PSA testing.   IMMUNIZATIONS:   - Tdap: Tetanus vaccination status reviewed: last tetanus booster within 10 years. - Influenza: Up to date - Pneumovax: Up to date - Prevnar: Up to date - Zostavax vaccine: Up to date  SCREENING: - Colonoscopy: Up to date  Discussed with patient purpose of the colonoscopy is to detect colon cancer at curable precancerous or early stages   - AAA Screening: Not applicable  -Hearing Test: Not applicable  -Spirometry: Not applicable   PATIENT COUNSELING:    Sexuality: Discussed sexually transmitted diseases, partner selection, use of condoms, avoidance of unintended pregnancy  and contraceptive alternatives.   Advised to avoid cigarette smoking.  I discussed with the patient that most people either abstain from alcohol or drink within safe limits (<=14/week and <=4 drinks/occasion for males, <=7/weeks and <= 3 drinks/occasion for females) and that the risk for alcohol disorders and other health effects rises proportionally with the number of drinks per week and how often a drinker exceeds daily limits.  Discussed cessation/primary prevention of drug use and availability of treatment for abuse.    Diet: Encouraged to adjust caloric intake to maintain  or achieve ideal body weight, to reduce intake of dietary saturated fat and total fat, to limit sodium intake by avoiding high sodium foods and not adding table salt, and to maintain adequate dietary potassium and calcium preferably from fresh fruits, vegetables, and low-fat dairy products.    stressed the importance of regular exercise  Injury prevention: Discussed safety belts, safety helmets, smoke detector, smoking near bedding or upholstery.   Dental health: Discussed importance of regular tooth brushing, flossing, and dental visits.   Follow up plan: NEXT PREVENTATIVE PHYSICAL DUE IN 1 YEAR. Return in about 5 months (around 03/10/2023) for HTN, HLD, DM2 FU.

## 2022-10-09 NOTE — Assessment & Plan Note (Signed)
A voluntary discussion about advance care planning including the explanation and discussion of advance directives was extensively discussed  with the patient for 10 minutes with patient.  Explanation about the health care proxy and Living will was reviewed and packet with forms with explanation of how to fill them out was given.  During this discussion, the patient was able to identify a health care proxy as his wife and already has the paperwork filled out and does not wish to make any changes.

## 2022-10-09 NOTE — Assessment & Plan Note (Signed)
Chronic, stable. Currently taking 18 units tresiba daily, jardiance '25mg'$ ,and  metformin 1,'000mg'$  BID. A1C today is 7.6% . Sees improvement with Duloxetine daily.  Continue current regimen. Refills sent to the pharmacy. Follow-up in 3 months.

## 2022-10-09 NOTE — Assessment & Plan Note (Signed)
Chronic.  Controlled.  Continue with current medication regimen on Amlodipine '5mg'$  and Benzapril '40mg'$  daily.  Labs ordered today.  Refills sent today.  Return to clinic in 5 months for reevaluation.  Call sooner if concerns arise.  Continue to check blood pressures at home.

## 2022-10-09 NOTE — Assessment & Plan Note (Signed)
Chronic.  Controlled.  Continue with current medication regimen of Crestor '40mg'$  daily.  Refills sent today.  Labs ordered today.  Return to clinic in 5 months for reevaluation.  Call sooner if concerns arise.

## 2022-10-09 NOTE — Assessment & Plan Note (Signed)
Chronic.  Controlled.  Continue with current medication regimen on Amlodipine '5mg'$  and Benzapril '40mg'$  daily.  Labs ordered today.  Refills sent today.  Return to clinic in 5 months for reevaluation.  Call sooner if concerns arise.

## 2022-10-09 NOTE — Assessment & Plan Note (Signed)
Labs ordered at visit today.  Will make recommendations based on lab results.   

## 2022-10-10 LAB — CBC WITH DIFFERENTIAL/PLATELET
Basophils Absolute: 0 10*3/uL (ref 0.0–0.2)
Basos: 1 %
EOS (ABSOLUTE): 0.2 10*3/uL (ref 0.0–0.4)
Eos: 4 %
Hematocrit: 46.3 % (ref 37.5–51.0)
Hemoglobin: 15.4 g/dL (ref 13.0–17.7)
Immature Grans (Abs): 0 10*3/uL (ref 0.0–0.1)
Immature Granulocytes: 0 %
Lymphocytes Absolute: 1.9 10*3/uL (ref 0.7–3.1)
Lymphs: 30 %
MCH: 31.3 pg (ref 26.6–33.0)
MCHC: 33.3 g/dL (ref 31.5–35.7)
MCV: 94 fL (ref 79–97)
Monocytes Absolute: 0.8 10*3/uL (ref 0.1–0.9)
Monocytes: 13 %
Neutrophils Absolute: 3.4 10*3/uL (ref 1.4–7.0)
Neutrophils: 52 %
Platelets: 366 10*3/uL (ref 150–450)
RBC: 4.92 x10E6/uL (ref 4.14–5.80)
RDW: 12.3 % (ref 11.6–15.4)
WBC: 6.4 10*3/uL (ref 3.4–10.8)

## 2022-10-10 LAB — LIPID PANEL
Chol/HDL Ratio: 2.9 ratio (ref 0.0–5.0)
Cholesterol, Total: 152 mg/dL (ref 100–199)
HDL: 53 mg/dL (ref >39)
LDL Chol Calc (NIH): 76 mg/dL (ref 0–99)
Triglycerides: 133 mg/dL (ref 0–149)
VLDL Cholesterol Cal: 23 mg/dL (ref 5–40)

## 2022-10-10 LAB — COMPREHENSIVE METABOLIC PANEL
ALT: 20 IU/L (ref 0–44)
AST: 17 IU/L (ref 0–40)
Albumin/Globulin Ratio: 1.9 (ref 1.2–2.2)
Albumin: 4.6 g/dL (ref 3.9–4.9)
Alkaline Phosphatase: 82 IU/L (ref 44–121)
BUN/Creatinine Ratio: 23 (ref 10–24)
BUN: 19 mg/dL (ref 8–27)
Bilirubin Total: 0.4 mg/dL (ref 0.0–1.2)
CO2: 24 mmol/L (ref 20–29)
Calcium: 9.6 mg/dL (ref 8.6–10.2)
Chloride: 98 mmol/L (ref 96–106)
Creatinine, Ser: 0.84 mg/dL (ref 0.76–1.27)
Globulin, Total: 2.4 g/dL (ref 1.5–4.5)
Glucose: 115 mg/dL — ABNORMAL HIGH (ref 70–99)
Potassium: 4.6 mmol/L (ref 3.5–5.2)
Sodium: 134 mmol/L (ref 134–144)
Total Protein: 7 g/dL (ref 6.0–8.5)
eGFR: 94 mL/min/{1.73_m2} (ref >59)

## 2022-10-10 LAB — HEMOGLOBIN A1C
Est. average glucose Bld gHb Est-mCnc: 169 mg/dL
Hgb A1c MFr Bld: 7.5 % — ABNORMAL HIGH (ref 4.8–5.6)

## 2022-10-10 LAB — TSH: TSH: 2.13 u[IU]/mL (ref 0.450–4.500)

## 2022-10-10 LAB — PSA: Prostate Specific Ag, Serum: 0.2 ng/mL (ref 0.0–4.0)

## 2022-10-10 LAB — VITAMIN B12: Vitamin B-12: 530 pg/mL (ref 232–1245)

## 2022-10-10 NOTE — Addendum Note (Signed)
Addended by: Jon Billings on: 10/10/2022 08:03 AM   Modules accepted: Level of Service

## 2022-10-10 NOTE — Progress Notes (Signed)
Hi Mark Allen. It was nice to see you yesterday.  Your lab work looks good.  Your A1c is well controlled at 7.5.  Your microalbumin has improved some which is good news.  No concerns at this time. Continue with your current medication regimen.  Follow up as discussed.  Please let me know if you have any questions.

## 2022-10-20 ENCOUNTER — Other Ambulatory Visit: Payer: Self-pay

## 2022-10-20 MED ORDER — EMPAGLIFLOZIN 25 MG PO TABS: 25.0000 mg | ORAL_TABLET | Freq: Every day | ORAL | 1 refills | Status: DC

## 2022-10-21 ENCOUNTER — Other Ambulatory Visit: Payer: Self-pay | Admitting: Nurse Practitioner

## 2022-10-21 DIAGNOSIS — E785 Hyperlipidemia, unspecified: Secondary | ICD-10-CM

## 2022-10-21 DIAGNOSIS — G4700 Insomnia, unspecified: Secondary | ICD-10-CM

## 2022-10-22 NOTE — Telephone Encounter (Signed)
Future visit in 4 months. Requested Prescriptions  Pending Prescriptions Disp Refills   rosuvastatin (CRESTOR) 40 MG tablet [Pharmacy Med Name: ROSUVASTATIN CALCIUM 40 MG Tablet] 90 tablet 1    Sig: TAKE 1 TABLET EVERY DAY     Cardiovascular:  Antilipid - Statins 2 Failed - 10/21/2022  7:09 PM      Failed - Lipid Panel in normal range within the last 12 months    Cholesterol, Total  Date Value Ref Range Status  10/09/2022 152 100 - 199 mg/dL Final   Cholesterol Piccolo, Waived  Date Value Ref Range Status  03/23/2017 145 <200 mg/dL Final    Comment:                            Desirable                <200                         Borderline High      200- 239                         High                     >239    LDL Chol Calc (NIH)  Date Value Ref Range Status  10/09/2022 76 0 - 99 mg/dL Final   HDL  Date Value Ref Range Status  10/09/2022 53 >39 mg/dL Final   Triglycerides  Date Value Ref Range Status  10/09/2022 133 0 - 149 mg/dL Final   Triglycerides Piccolo,Waived  Date Value Ref Range Status  03/23/2017 74 <150 mg/dL Final    Comment:                            Normal                   <150                         Borderline High     150 - 199                         High                200 - 499                         Very High                >499          Passed - Cr in normal range and within 360 days    Creatinine, Ser  Date Value Ref Range Status  10/09/2022 0.84 0.76 - 1.27 mg/dL Final         Passed - Patient is not pregnant      Passed - Valid encounter within last 12 months    Recent Outpatient Visits           1 week ago Encounter for annual wellness exam in Medicare patient   [place], NP   3 months ago Type II diabetes mellitus with neurological manifestations [facility])   [place] Mecum, Dani Gobble, PA-C  7 months ago Hypertension associated with diabetes [facility])   [place], NP   10 months ago Essential hypertension   [place] Vigg, Avanti, MD   1 year ago Diabetes mellitus without complication [facility])   [place] Vigg, Avanti, MD       Future Appointments             In 4 months Jon Billings, NP [place], [facility]             traZODone (DESYREL) 50 MG tablet [Pharmacy Med Name: TRAZODONE HYDROCHLORIDE 50 MG Tablet] 90 tablet 1    Sig: TAKE 1 TABLET (50 MG TOTAL) BY MOUTH AT BEDTIME AS NEEDED FOR SLEEP.     Psychiatry: Antidepressants - Serotonin Modulator Passed - 10/21/2022  7:09 PM      Passed - Valid encounter within last 6 months    Recent Outpatient Visits           1 week ago Encounter for annual wellness exam in Medicare patient   [place], Karen, NP   3 months ago Type II diabetes mellitus with neurological manifestations ([place])   [place], Erin E, PA-C   7 months ago Hypertension associated with diabetes [facility])   [place] Jon Billings, NP   10 months ago Essential hypertension   [place] Vigg, Avanti, MD   1 year ago Diabetes mellitus without complication [facility])   [place] Charlynne Cousins, MD       Future Appointments             In 4 months Jon Billings, NP [place]

## 2022-11-03 ENCOUNTER — Other Ambulatory Visit: Payer: Self-pay | Admitting: Nurse Practitioner

## 2022-11-03 DIAGNOSIS — E1149 Type 2 diabetes mellitus with other diabetic neurological complication: Secondary | ICD-10-CM

## 2022-11-04 ENCOUNTER — Ambulatory Visit (INDEPENDENT_AMBULATORY_CARE_PROVIDER_SITE_OTHER): Payer: Medicare HMO

## 2022-11-04 ENCOUNTER — Encounter: Payer: Self-pay | Admitting: Nurse Practitioner

## 2022-11-04 VITALS — Ht 69.5 in | Wt 205.0 lb

## 2022-11-04 DIAGNOSIS — Z0000 Encounter for general adult medical examination without abnormal findings: Secondary | ICD-10-CM

## 2022-11-04 NOTE — Patient Instructions (Signed)
Mark Allen , Thank you for taking time to come for your Medicare Wellness Visit. I appreciate your ongoing commitment to your health goals. Please review the following plan we discussed and let me know if I can assist you in the future.   These are the goals we discussed:  Goals      DIET - EAT MORE FRUITS AND VEGETABLES     Monitor and Manage My Blood Sugar-Diabetes Type 2     Timeframe:  Long-Range Goal Priority:  High Start Date:                             Expected End Date:                       Follow Up Date 3 month follow up    - check blood sugar at prescribed times - enter blood sugar readings and medication or insulin into daily log - take the blood sugar meter to all doctor visits    Why is this important?   Checking your blood sugar at home helps to keep it from getting very high or very low.  Writing the results in a diary or log helps the doctor know how to care for you.  Your blood sugar log should have the time, date and the results.  Also, write down the amount of insulin or other medicine that you take.  Other information, like what you ate, exercise done and how you were feeling, will also be helpful.     Notes:      Patient Stated     08/31/2020, wants to start exercising 3 days a week     Patient Stated     Would like to get back to the gym     Track and Manage My Blood Pressure-Hypertension     Timeframe:  Long-Range Goal Priority:  High Start Date:                             Expected End Date:                       Follow Up Date 3 month follow up    - check blood pressure weekly - write blood pressure results in a log or diary    Why is this important?   You won't feel high blood pressure, but it can still hurt your blood vessels.  High blood pressure can cause heart or kidney problems. It can also cause a stroke.  Making lifestyle changes like losing a little weight or eating less salt will help.  Checking your blood pressure at home and at  different times of the day can help to control blood pressure.  If the doctor prescribes medicine remember to take it the way the doctor ordered.  Call the office if you cannot afford the medicine or if there are questions about it.     Notes:         This is a list of the screening recommended for you and due dates:  Health Maintenance  Topic Date Due   COVID-19 Vaccine (3 - Pfizer risk series) 01/30/2020   Eye exam for diabetics  09/19/2022   Hemoglobin A1C  04/09/2023   Yearly kidney function blood test for diabetes  10/10/2023   Yearly kidney health urinalysis for diabetes  10/10/2023  Complete foot exam   10/10/2023   Colon Cancer Screening  10/23/2023   Medicare Annual Wellness Visit  11/05/2023   DTaP/Tdap/Td vaccine (3 - Tdap) 05/25/2030   Pneumonia Vaccine  Completed   Flu Shot  Completed   Hepatitis C Screening: USPSTF Recommendation to screen - Ages 18-79 yo.  Completed   Zoster (Shingles) Vaccine  Completed   HPV Vaccine  Aged Out    Advanced directives: no  Conditions/risks identified: none  Next appointment: Follow up in one year for your annual wellness visit. 11/10/23 @ 8:15 am by phone  Preventive Care 65 Years and Older, Male  Preventive care refers to lifestyle choices and visits with your health care provider that can promote health and wellness. What does preventive care include? A yearly physical exam. This is also called an annual well check. Dental exams once or twice a year. Routine eye exams. Ask your health care provider how often you should have your eyes checked. Personal lifestyle choices, including: Daily care of your teeth and gums. Regular physical activity. Eating a healthy diet. Avoiding tobacco and drug use. Limiting alcohol use. Practicing safe sex. Taking low doses of aspirin every day. Taking vitamin and mineral supplements as recommended by your health care provider. What happens during an annual well check? The services and  screenings done by your health care provider during your annual well check will depend on your age, overall health, lifestyle risk factors, and family history of disease. Counseling  Your health care provider may ask you questions about your: Alcohol use. Tobacco use. Drug use. Emotional well-being. Home and relationship well-being. Sexual activity. Eating habits. History of falls. Memory and ability to understand (cognition). Work and work Statistician. Screening  You may have the following tests or measurements: Height, weight, and BMI. Blood pressure. Lipid and cholesterol levels. These may be checked every 5 years, or more frequently if you are over 65 years old. Skin check. Lung cancer screening. You may have this screening every year starting at age 63 if you have a 30-pack-year history of smoking and currently smoke or have quit within the past 15 years. Fecal occult blood test (FOBT) of the stool. You may have this test every year starting at age 6. Flexible sigmoidoscopy or colonoscopy. You may have a sigmoidoscopy every 5 years or a colonoscopy every 10 years starting at age 27. Prostate cancer screening. Recommendations will vary depending on your family history and other risks. Hepatitis C blood test. Hepatitis B blood test. Sexually transmitted disease (STD) testing. Diabetes screening. This is done by checking your blood sugar (glucose) after you have not eaten for a while (fasting). You may have this done every 1-3 years. Abdominal aortic aneurysm (AAA) screening. You may need this if you are a current or former smoker. Osteoporosis. You may be screened starting at age 77 if you are at high risk. Talk with your health care provider about your test results, treatment options, and if necessary, the need for more tests. Vaccines  Your health care provider may recommend certain vaccines, such as: Influenza vaccine. This is recommended every year. Tetanus, diphtheria, and  acellular pertussis (Tdap, Td) vaccine. You may need a Td booster every 10 years. Zoster vaccine. You may need this after age 62. Pneumococcal 13-valent conjugate (PCV13) vaccine. One dose is recommended after age 59. Pneumococcal polysaccharide (PPSV23) vaccine. One dose is recommended after age 44. Talk to your health care provider about which screenings and vaccines you need and how often you  need them. This information is not intended to replace advice given to you by your health care provider. Make sure you discuss any questions you have with your health care provider. Document Released: 10/12/2015 Document Revised: 06/04/2016 Document Reviewed: 07/17/2015 Elsevier Interactive Patient Education  2017 [place] Prevention in the Home Falls can cause injuries. They can happen to people of all ages. There are many things you can do to make your home safe and to help prevent falls. What can I do on the outside of my home? Regularly fix the edges of walkways and driveways and fix any cracks. Remove anything that might make you trip as you walk through a door, such as a raised step or threshold. Trim any bushes or trees on the path to your home. Use bright outdoor lighting. Clear any walking paths of anything that might make someone trip, such as rocks or tools. Regularly check to see if handrails are loose or broken. Make sure that both sides of any steps have handrails. Any raised decks and porches should have guardrails on the edges. Have any leaves, snow, or ice cleared regularly. Use sand or salt on walking paths during winter. Clean up any spills in your garage right away. This includes oil or grease spills. What can I do in the bathroom? Use night lights. Install grab bars by the toilet and in the tub and shower. Do not use towel bars as grab bars. Use non-skid mats or decals in the tub or shower. If you need to sit down in the shower, use a plastic, non-slip stool. Keep  the floor dry. Clean up any water that spills on the floor as soon as it happens. Remove soap buildup in the tub or shower regularly. Attach bath mats securely with double-sided non-slip rug tape. Do not have throw rugs and other things on the floor that can make you trip. What can I do in the bedroom? Use night lights. Make sure that you have a light by your bed that is easy to reach. Do not use any sheets or blankets that are too big for your bed. They should not hang down onto the floor. Have a firm chair that has side arms. You can use this for support while you get dressed. Do not have throw rugs and other things on the floor that can make you trip. What can I do in the kitchen? Clean up any spills right away. Avoid walking on wet floors. Keep items that you use a lot in easy-to-reach places. If you need to reach something above you, use a strong step stool that has a grab bar. Keep electrical cords out of the way. Do not use floor polish or wax that makes floors slippery. If you must use wax, use non-skid floor wax. Do not have throw rugs and other things on the floor that can make you trip. What can I do with my stairs? Do not leave any items on the stairs. Make sure that there are handrails on both sides of the stairs and use them. Fix handrails that are broken or loose. Make sure that handrails are as long as the stairways. Check any carpeting to make sure that it is firmly attached to the stairs. Fix any carpet that is loose or worn. Avoid having throw rugs at the top or bottom of the stairs. If you do have throw rugs, attach them to the floor with carpet tape. Make sure that you have a light switch at  the top of the stairs and the bottom of the stairs. If you do not have them, ask someone to add them for you. What else can I do to help prevent falls? Wear shoes that: Do not have high heels. Have rubber bottoms. Are comfortable and fit you well. Are closed at the toe. Do not  wear sandals. If you use a stepladder: Make sure that it is fully opened. Do not climb a closed stepladder. Make sure that both sides of the stepladder are locked into place. Ask someone to hold it for you, if possible. Clearly mark and make sure that you can see: Any grab bars or handrails. First and last steps. Where the edge of each step is. Use tools that help you move around (mobility aids) if they are needed. These include: Canes. Walkers. Scooters. Crutches. Turn on the lights when you go into a dark area. Replace any light bulbs as soon as they burn out. Set up your furniture so you have a clear path. Avoid moving your furniture around. If any of your floors are uneven, fix them. If there are any pets around you, be aware of where they are. Review your medicines with your doctor. Some medicines can make you feel dizzy. This can increase your chance of falling. Ask your doctor what other things that you can do to help prevent falls. This information is not intended to replace advice given to you by your health care provider. Make sure you discuss any questions you have with your health care provider. Document Released: 07/12/2009 Document Revised: 02/21/2016 Document Reviewed: 10/20/2014 Elsevier Interactive Patient Education  2017 [employer].

## 2022-11-04 NOTE — Progress Notes (Signed)
Virtual Visit via Telephone Note  I connected with  Mark Allen on 11/04/22 at  8:15 AM EST by telephone and verified that I am speaking with the correct person using two identifiers.  Location: Patient: home Provider: [facility] Persons participating in the virtual visit: patient/Nurse Health Advisor   I discussed the limitations, risks, security and privacy concerns of performing an evaluation and management service by telephone and the availability of in person appointments. The patient expressed understanding and agreed to proceed.  Interactive audio and video telecommunications were attempted between this nurse and patient, however failed, due to patient having technical difficulties OR patient did not have access to video capability.  We continued and completed visit with audio only.  Some vital signs may be absent or patient reported.   Dionisio David, LPN  Subjective:   Mark Allen is a 72 y.o. male who presents for Medicare Annual/Subsequent preventive examination.  Review of Systems     Cardiac Risk Factors include: advanced age (>71mn, >>34women);diabetes mellitus;male gender;hypertension     Objective:    There were no vitals filed for this visit. There is no height or weight on file to calculate BMI.     11/04/2022    8:23 AM 09/02/2021   12:28 PM 08/31/2020    2:34 PM 10/22/2018   10:03 AM 07/19/2018    2:07 PM 10/16/2016    2:54 PM  Advanced Directives  Does Patient Have a Medical Advance Directive? No Yes Yes Yes No No  Type of Advance Directive  Living [place] will    Copy of [place] Chart?   No - copy requested No - copy requested    Would patient like information on creating a medical advance directive? No - Patient declined    No - Patient declined     Current Medications (verified) Outpatient Encounter Medications as of 11/04/2022  Medication Sig    amLODipine (NORVASC) 5 MG tablet Take 1 tablet (5 mg total) by mouth daily.   benazepril (LOTENSIN) 20 MG tablet Take 1 tablet (20 mg total) by mouth daily.   Blood Glucose Calibration (TRUE METRIX LEVEL 3) High SOLN    Blood Glucose Monitoring Suppl (TRUE METRIX METER) w/Device KIT USE AS DIRECTED   cyanocobalamin 1000 MCG tablet Take 1,000 mcg by mouth daily.   DROPLET PEN NEEDLES 32G X 4 MM MISC USE 2 (TWO) TIMES DAILY AS NEEDED.   DULoxetine (CYMBALTA) 60 MG capsule TAKE 1 CAPSULE EVERY DAY   empagliflozin (JARDIANCE) 25 MG TABS tablet Take 1 tablet (25 mg total) by mouth daily.   glucose blood (TRUE METRIX BLOOD GLUCOSE TEST) test strip 1 each by Other route 2 (two) times daily. Use as instructed   insulin degludec (TRESIBA FLEXTOUCH) 100 UNIT/ML FlexTouch Pen Inject 18 Units into the skin at bedtime.   metFORMIN (GLUCOPHAGE) 1000 MG tablet TAKE 1 TABLET (1,000 MG) BY MOUTH 2 TIMES DAILY WITH MEALS   rosuvastatin (CRESTOR) 40 MG tablet TAKE 1 TABLET EVERY DAY   traZODone (DESYREL) 50 MG tablet TAKE 1 TABLET (50 MG TOTAL) BY MOUTH AT BEDTIME AS NEEDED FOR SLEEP.   TRUEplus Lancets 28G MISC 1 each by Other route in the morning and at bedtime.   No facility-administered encounter medications on file as of 11/04/2022.    Allergies (verified) Sulfa antibiotics and Sulfacetamide sodium   History: Past Medical History:  Diagnosis Date   Broken leg  06/2018   Left leg   Calculus of kidney    Diabetes mellitus without complication ([place]) 95/02/3874   Hyperlipidemia    Hypertension    Impotence, organic    Insomnia    Need for shingles vaccine 07/30/2021   Testicular hypofunction    Type II diabetes mellitus with neurological manifestations [facility])    Past Surgical History:  Procedure Laterality Date   COLONOSCOPY WITH PROPOFOL N/A 10/22/2018   Procedure: COLONOSCOPY WITH PROPOFOL;  Surgeon: Jonathon Bellows, MD;  Location: [facility] ENDOSCOPY;  Service: Gastroenterology;  Laterality: N/A;   NASAL  SINUS SURGERY  2004   REFRACTIVE SURGERY Bilateral    SPLENECTOMY  1963   TONSILLECTOMY     Family History  Problem Relation Age of Onset   Cancer Father    Social History   Socioeconomic History   Marital status: Married    Spouse name: Not on file   Number of children: Not on file   Years of education: Not on file   Highest education level: Not on file  Occupational History   Not on file  Tobacco Use   Smoking status: Former    Types: Cigarettes    Quit date: 01/27/1989    Years since quitting: 33.7   Smokeless tobacco: Former    Types: Chew    Quit date: 09/29/1990  Vaping Use   Vaping Use: Never used  Substance and Sexual Activity   Alcohol use: Yes    Alcohol/week: 0.0 standard drinks of alcohol    Comment: rare,none last 24hrs   Drug use: No   Sexual activity: Yes  Other Topics Concern   Not on file  Social History Narrative   Not on file   Social Determinants of Health   Financial Resource Strain: Low Risk  (11/04/2022)   Overall Financial Resource Strain (CARDIA)    Difficulty of Paying Living Expenses: Not hard at all  Food Insecurity: No Food Insecurity (11/04/2022)   Hunger Vital Sign    Worried About Running Out of Food in the Last Year: Never true    Ran Out of Food in the Last Year: Never true  Transportation Needs: No Transportation Needs (11/04/2022)   PRAPARE - Hydrologist (Medical): No    Lack of Transportation (Non-Medical): No  Physical Activity: Inactive (11/04/2022)   Exercise Vital Sign    Days of Exercise per Week: 0 days    Minutes of Exercise per Session: 0 min  Stress: No Stress Concern Present (11/04/2022)   [place]    Feeling of Stress : Not at all  Social Connections: Moderately Integrated (11/04/2022)   Social Connection and Isolation Panel [NHANES]    Frequency of Communication with Friends and Family: More than three times a week     Frequency of Social Gatherings with Friends and Family: Twice a week    Attends Religious Services: 1 to 4 times per year    Active Member of [employer] or Organizations: No    Attends Music therapist: Never    Marital Status: Married    Tobacco Counseling Counseling given: Not Answered   Clinical Intake:  Pre-visit preparation completed: Yes  Pain : No/denies pain     Nutritional Risks: None Diabetes: Yes CBG done?: No Did pt. bring in CBG monitor from home?: No  How often do you need to have someone help you when you read instructions, pamphlets, or other written materials from  your doctor or pharmacy?: 1 - Never  Diabetic?yes Nutrition Risk Assessment:  Has the patient had any N/V/D within the last 2 months?  No  Does the patient have any non-healing wounds?  No  Has the patient had any unintentional weight loss or weight gain?  No   Diabetes:  Is the patient diabetic?  Yes  If diabetic, was a CBG obtained today?  No  Did the patient bring in their glucometer from home?  No  How often do you monitor your CBG's? Twice/day.   Financial Strains and Diabetes Management:  Are you having any financial strains with the device, your supplies or your medication? No .  Does the patient want to be seen by Chronic Care Management for management of their diabetes?  No  Would the patient like to be referred to a Nutritionist or for Diabetic Management?  No   Diabetic Exams:  Diabetic Eye Exam: Completed 09/19/21. Overdue for diabetic eye exam. Pt has been advised about the importance in completing this exam.  Diabetic Foot Exam: Completed 10/09/22. Pt has been advised about the importance in completing this exam.   Interpreter Needed?: No  Information entered by :: Kirke Shaggy, LPN   Activities of Daily Living    11/04/2022    8:24 AM 10/31/2022   10:14 AM  In your present state of health, do you have any difficulty performing the following activities:   Hearing? 0 0  Vision? 0 0  Difficulty concentrating or making decisions? 0 0  Walking or climbing stairs? 0 0  Dressing or bathing? 0 0  Doing errands, shopping? 0 0  Preparing Food and eating ? N N  Using the Toilet? N N  In the past six months, have you accidently leaked urine? N N  Do you have problems with loss of bowel control? N N  Managing your Medications? N N  Managing your Finances? N N  Housekeeping or managing your Housekeeping? N N    Patient Care Team: Jon Billings, NP as PCP - General (Nurse Practitioner) Lane Hacker, [facility] (Pharmacist)  Indicate any recent Medical Services you may have received from other than [facility] providers in the past year (date may be approximate).     Assessment:   This is a routine wellness examination for Ohn.  Hearing/Vision screen Hearing Screening - Comments:: Wears aids Vision Screening - Comments:: Readers- [facility]  Dietary issues and exercise activities discussed: Current Exercise Habits: The patient does not participate in regular exercise at present   Goals Addressed             This Visit's Progress    DIET - EAT MORE FRUITS AND VEGETABLES         Depression Screen    11/04/2022    8:22 AM 10/09/2022   10:24 AM 07/01/2022    9:06 AM 03/25/2022    9:46 AM 12/09/2021    9:28 AM 09/11/2021   10:22 AM 09/02/2021   12:27 PM  PHQ 2/9 Scores  PHQ - 2 Score 0 0 0 0 0 0 0  PHQ- 9 Score 0 0 0 1 0 0     Fall Risk    11/04/2022    8:24 AM 10/31/2022   10:14 AM 10/09/2022   10:24 AM 07/01/2022    9:06 AM 03/25/2022    9:45 AM  Fall Risk   Falls in the past year? 0 0 0 0 0  Number falls in past yr: 0 0 0  0 0  Injury with Fall? 0 0 0 0 0  Risk for fall due to : No Fall Risks  No Fall Risks No Fall Risks No Fall Risks  Follow up Falls prevention discussed;Falls evaluation completed  Falls evaluation completed Falls evaluation completed Falls evaluation completed    FALL RISK PREVENTION PERTAINING TO THE  HOME:  Any stairs in or around the home? Yes  If so, are there any without handrails? No  Home free of loose throw rugs in walkways, pet beds, electrical cords, etc? Yes  Adequate lighting in your home to reduce risk of falls? Yes   ASSISTIVE DEVICES UTILIZED TO PREVENT FALLS:  Life alert? No  Use of a cane, walker or w/c? No  Grab bars in the bathroom? No  Shower chair or bench in shower? Yes  Elevated toilet seat or a handicapped toilet? Yes   Cognitive Function:        11/04/2022    8:29 AM 08/31/2020    2:38 PM  6CIT Screen  What Year? 0 points 0 points  What month? 0 points 0 points  What time? 0 points 0 points  Count back from 20 0 points 0 points  Months in reverse 0 points 0 points  Repeat phrase 2 points 0 points  Total Score 2 points 0 points    Immunizations Immunization History  Administered Date(s) Administered   Fluad Quad(high Dose 65+) 06/12/2020, 06/18/2021, 07/01/2022   Influenza, High Dose Seasonal PF 06/16/2018, 07/01/2019   Influenza,inj,Quad PF,6+ Mos 07/06/2015, 06/25/2016, 06/23/2017   Influenza-Unspecified 08/17/2014, 06/22/2019   PFIZER(Purple Top)SARS-COV-2 Vaccination 12/12/2019, 01/02/2020   Pneumococcal Conjugate-13 03/15/2018   Pneumococcal Polysaccharide-23 06/12/2020   Pneumococcal-Unspecified 07/10/2008   Td 07/17/2009, 05/25/2020   Zoster Recombinat (Shingrix) 07/30/2021, 03/25/2022   Zoster, Live 08/10/2012    TDAP status: Up to date  Flu Vaccine status: Up to date  Pneumococcal vaccine status: Up to date  Covid-19 vaccine status: Completed vaccines  Qualifies for Shingles Vaccine? Yes   Zostavax completed Yes   Shingrix Completed?: Yes  Screening Tests Health Maintenance  Topic Date Due   COVID-19 Vaccine (3 - Pfizer risk series) 01/30/2020   OPHTHALMOLOGY EXAM  09/19/2022   HEMOGLOBIN A1C  04/09/2023   Diabetic kidney evaluation - eGFR measurement  10/10/2023   Diabetic kidney evaluation - Urine ACR  10/10/2023    FOOT EXAM  10/10/2023   COLONOSCOPY (Pts 45-33yr Insurance coverage will need to be confirmed)  10/23/2023   Medicare Annual Wellness (AWV)  11/05/2023   DTaP/Tdap/Td (3 - Tdap) 05/25/2030   Pneumonia Vaccine 70 Years old  Completed   INFLUENZA VACCINE  Completed   Hepatitis C Screening  Completed   Zoster Vaccines- Shingrix  Completed   HPV VACCINES  Aged Out    Health Maintenance  Health Maintenance Due  Topic Date Due   COVID-19 Vaccine (3 - Pfizer risk series) 01/30/2020   OPHTHALMOLOGY EXAM  09/19/2022    Colorectal cancer screening: Type of screening: Colonoscopy. Completed 10/22/18. Repeat every 5 years  Lung Cancer Screening: (Low Dose CT Chest recommended if Age 70-80years, 30 pack-year currently smoking OR have quit w/in 15years.) does not qualify.     Additional Screening:  Hepatitis C Screening: does qualify; Completed 08/21/15  Vision Screening: Recommended annual ophthalmology exams for early detection of glaucoma and other disorders of the eye. Is the patient up to date with their annual eye exam?  Yes  Who is the provider or what is the name of  the office in which the patient attends annual eye exams? [place] If pt is not established with a provider, would they like to be referred to a provider to establish care? No .   Dental Screening: Recommended annual dental exams for proper oral hygiene  Community Resource Referral / Chronic Care Management: CRR required this visit?  No   CCM required this visit?  No      Plan:     I have personally reviewed and noted the following in the patient's chart:   Medical and social history Use of alcohol, tobacco or illicit drugs  Current medications and supplements including opioid prescriptions. Patient is not currently taking opioid prescriptions. Functional ability and status Nutritional status Physical activity Advanced directives List of other physicians Hospitalizations, surgeries, and ER visits in  previous 12 months Vitals Screenings to include cognitive, depression, and falls Referrals and appointments  In addition, I have reviewed and discussed with patient certain preventive protocols, quality metrics, and best practice recommendations. A written personalized care plan for preventive services as well as general preventive health recommendations were provided to patient.     Dionisio David, LPN   12/04/9022   Nurse Notes: none

## 2022-11-04 NOTE — Telephone Encounter (Signed)
Requested Prescriptions  Pending Prescriptions Disp Refills   TRESIBA FLEXTOUCH 100 UNIT/ML FlexTouch Pen [Pharmacy Med Name: TRESIBA FLEXTOUCH 100 UNIT/ML Solution Pen-injector] 15 mL 0    Sig: INJECT 16 UNITS UNDER THE SKIN AT BEDTIME     Endocrinology:  Diabetes - Insulins Passed - 11/03/2022  2:16 AM      Passed - HBA1C is between 0 and 7.9 and within 180 days    Hemoglobin A1C  Date Value Ref Range Status  05/22/2016 7.6  Final   HB A1C (BAYER DCA - WAIVED)  Date Value Ref Range Status  07/01/2022 7.2 (H) 4.8 - 5.6 % Final    Comment:             Prediabetes: 5.7 - 6.4          Diabetes: >6.4          Glycemic control for adults with diabetes: <7.0    Hgb A1c MFr Bld  Date Value Ref Range Status  10/09/2022 7.5 (H) 4.8 - 5.6 % Final    Comment:             Prediabetes: 5.7 - 6.4          Diabetes: >6.4          Glycemic control for adults with diabetes: <7.0          Passed - Valid encounter within last 6 months    Recent Outpatient Visits           3 weeks ago Encounter for annual wellness exam in Medicare patient   [place], Karen, NP   4 months ago Type II diabetes mellitus with neurological manifestations ([place])   [place], Erin E, PA-C   7 months ago Hypertension associated with diabetes [facility])   [place] Jon Billings, NP   11 months ago Essential hypertension   [place] Vigg, Avanti, MD   1 year ago Diabetes mellitus without complication [facility])   [place] Vigg, Avanti, MD       Future Appointments             In 4 months Jon Billings, NP [place], [facility]

## 2022-11-06 DIAGNOSIS — L821 Other seborrheic keratosis: Secondary | ICD-10-CM | POA: Diagnosis not present

## 2022-11-06 DIAGNOSIS — L814 Other melanin hyperpigmentation: Secondary | ICD-10-CM | POA: Diagnosis not present

## 2022-11-06 DIAGNOSIS — C44321 Squamous cell carcinoma of skin of nose: Secondary | ICD-10-CM | POA: Diagnosis not present

## 2022-11-06 DIAGNOSIS — Z85828 Personal history of other malignant neoplasm of skin: Secondary | ICD-10-CM | POA: Diagnosis not present

## 2022-11-06 DIAGNOSIS — L578 Other skin changes due to chronic exposure to nonionizing radiation: Secondary | ICD-10-CM | POA: Diagnosis not present

## 2022-11-06 DIAGNOSIS — D492 Neoplasm of unspecified behavior of bone, soft tissue, and skin: Secondary | ICD-10-CM | POA: Diagnosis not present

## 2022-11-11 ENCOUNTER — Other Ambulatory Visit: Payer: Self-pay | Admitting: Family Medicine

## 2022-11-11 NOTE — Telephone Encounter (Signed)
Requested by interface surescripts. Medication dose discontinued. 10/09/22. Requested Prescriptions  Refused Prescriptions Disp Refills   metFORMIN (GLUCOPHAGE-XR) 500 MG 24 hr tablet [Pharmacy Med Name: METFORMIN ER 500MG 24HR TABS] 360 tablet 1    Sig: TAKE 2 TABLETS(1000 MG) BY MOUTH TWICE DAILY     Endocrinology:  Diabetes - Biguanides Passed - 11/11/2022  3:13 AM      Passed - Cr in normal range and within 360 days    Creatinine, Ser  Date Value Ref Range Status  10/09/2022 0.84 0.76 - 1.27 mg/dL Final         Passed - HBA1C is between 0 and 7.9 and within 180 days    Hemoglobin A1C  Date Value Ref Range Status  05/22/2016 7.6  Final   HB A1C (BAYER DCA - WAIVED)  Date Value Ref Range Status  07/01/2022 7.2 (H) 4.8 - 5.6 % Final    Comment:             Prediabetes: 5.7 - 6.4          Diabetes: >6.4          Glycemic control for adults with diabetes: <7.0    Hgb A1c MFr Bld  Date Value Ref Range Status  10/09/2022 7.5 (H) 4.8 - 5.6 % Final    Comment:             Prediabetes: 5.7 - 6.4          Diabetes: >6.4          Glycemic control for adults with diabetes: <7.0          Passed - eGFR in normal range and within 360 days    GFR calc Af Amer  Date Value Ref Range Status  05/25/2020 107 >59 mL/min/1.73 Final    Comment:    **[facility] currently reports eGFR in compliance with the current**   recommendations of the [employer]. [facility] will   update reporting as new guidelines are published from the NKF-ASN   Task force.    GFR calc non Af Amer  Date Value Ref Range Status  05/25/2020 92 >59 mL/min/1.73 Final   eGFR  Date Value Ref Range Status  10/09/2022 94 >59 mL/min/1.73 Final         Passed - B12 Level in normal range and within 720 days    Vitamin B-12  Date Value Ref Range Status  10/09/2022 530 232 - 1,245 pg/mL Final         Passed - Valid encounter within last 6 months    Recent Outpatient Visits           1 month ago  Encounter for annual wellness exam in Medicare patient   [place], Karen, NP   4 months ago Type II diabetes mellitus with neurological manifestations ([place])   [place], Erin E, PA-C   7 months ago Hypertension associated with diabetes [facility])   [place] Jon Billings, NP   11 months ago Essential hypertension   [place] Vigg, Avanti, MD   1 year ago Diabetes mellitus without complication [facility])   [place] Vigg, Avanti, MD       Future Appointments             In 3 months Jon Billings, NP [place], [facility]  Passed - CBC within normal limits and completed in the last 12 months    WBC  Date Value Ref Range Status  10/09/2022 6.4 3.4 - 10.8 x10E3/uL Final   RBC  Date Value Ref Range Status  10/09/2022 4.92 4.14 - 5.80 x10E6/uL Final   Hemoglobin  Date Value Ref Range Status  10/09/2022 15.4 13.0 - 17.7 g/dL Final   Hematocrit  Date Value Ref Range Status  10/09/2022 46.3 37.5 - 51.0 % Final   MCHC  Date Value Ref Range Status  10/09/2022 33.3 31.5 - 35.7 g/dL Final   [facility]  Date Value Ref Range Status  10/09/2022 31.3 26.6 - 33.0 pg Final   MCV  Date Value Ref Range Status  10/09/2022 94 79 - 97 fL Final   No results found for: "PLTCOUNTKUC", "LABPLAT", "POCPLA" RDW  Date Value Ref Range Status  10/09/2022 12.3 11.6 - 15.4 % Final

## 2022-11-24 ENCOUNTER — Ambulatory Visit: Payer: Medicare HMO

## 2022-11-24 NOTE — Patient Outreach (Signed)
Care Management & Coordination Services Pharmacy Note  11/24/2022 Name:  Mark Allen MRN:  [id number] DOB:  December 11, 1950  Summary: -Makes too much money for PAP -Pleasant 72 year old male presents for f/u CCM visit. He is currently working full time as an IT consultant. He also [place] farm and has a donkey and some cows. He had a horse but they lost it recently after 45 years -Is married with 3 children, 4 granddaughters, and 1 great grand daughter  Recommendations/Changes made from today's visit: -None  Subjective: Mark Allen is an 72 y.o. year old male who is a primary patient of Jon Billings, NP.  The care coordination team was consulted for assistance with disease management and care coordination needs.    Engaged with patient by telephone for follow up visit.   Objective:  Lab Results  Component Value Date   CREATININE 0.84 10/09/2022   BUN 19 10/09/2022   EGFR 94 10/09/2022   GFRNONAA 92 05/25/2020   GFRAA 107 05/25/2020   NA 134 10/09/2022   K 4.6 10/09/2022   CALCIUM 9.6 10/09/2022   CO2 24 10/09/2022   GLUCOSE 115 (H) 10/09/2022    Lab Results  Component Value Date/Time   HGBA1C 7.5 (H) 10/09/2022 10:41 AM   HGBA1C 7.2 (H) 07/01/2022 09:30 AM   HGBA1C 7.6 (H) 03/25/2022 10:08 AM   HGBA1C 7.8 (H) 12/02/2021 09:28 AM   HGBA1C 7.6 05/22/2016 12:00 AM   MICROALBUR 10 10/09/2022 10:36 AM   MICROALBUR 30 (H) 10/09/2021 09:09 AM    Last diabetic Eye exam:  Lab Results  Component Value Date/Time   HMDIABEYEEXA Retinopathy (A) 09/19/2021 12:00 AM    Last diabetic Foot exam: No results found for: "HMDIABFOOTEX"   Lab Results  Component Value Date   CHOL 152 10/09/2022   HDL 53 10/09/2022   LDLCALC 76 10/09/2022   TRIG 133 10/09/2022   CHOLHDL 2.9 10/09/2022       Latest Ref Rng & Units 10/09/2022   10:41 AM 03/25/2022   10:08 AM 12/02/2021    9:30 AM  Hepatic Function  Total Protein 6.0 - 8.5 g/dL 7.0  7.0  6.3   Albumin  3.9 - 4.9 g/dL 4.6  4.6  4.2   AST 0 - 40 IU/L '17  12  10   '$ ALT 0 - 44 IU/L 20  20    Alk Phosphatase 44 - 121 IU/L 82  68  66   Total Bilirubin 0.0 - 1.2 mg/dL 0.4  0.5  0.3     Lab Results  Component Value Date/Time   TSH 2.130 10/09/2022 10:41 AM   TSH 1.760 07/23/2021 09:00 AM       Latest Ref Rng & Units 10/09/2022   10:41 AM 12/02/2021    9:28 AM 10/09/2021    9:12 AM  CBC  WBC 3.4 - 10.8 x10E3/uL 6.4  8.1  7.1   Hemoglobin 13.0 - 17.7 g/dL 15.4  15.3  15.7   Hematocrit 37.5 - 51.0 % 46.3  44.2  45.9   Platelets 150 - 450 x10E3/uL 366  249  356     Lab Results  Component Value Date/Time   VITAMINB12 530 10/09/2022 10:41 AM   VITAMINB12 697 03/25/2022 10:08 AM    Clinical ASCVD: Yes  The 10-year ASCVD risk score (Arnett DK, et al., 2019) is: 21.6%   Values used to calculate the score:     Age: 72 years  Sex: Male     Is Non-Hispanic African American: No     Diabetic: Yes     Tobacco smoker: No     Systolic Blood Pressure: 123456 mmHg     Is BP treated: Yes     HDL Cholesterol: 53 mg/dL     Total Cholesterol: 152 mg/dL    Other: (CHADS2VASc if Afib, MMRC or CAT for COPD, ACT, DEXA)     11/04/2022    8:22 AM 10/09/2022   10:24 AM 07/01/2022    9:06 AM  Depression screen PHQ 2/9  Decreased Interest 0 0 0  Down, Depressed, Hopeless 0 0 0  PHQ - 2 Score 0 0 0  Altered sleeping 0 0 0  Tired, decreased energy 0 0 0  Change in appetite 0 0 0  Feeling bad or failure about yourself  0 0 0  Trouble concentrating 0 0 0  Moving slowly or fidgety/restless 0 0 0  Suicidal thoughts 0 0 0  PHQ-9 Score 0 0 0  Difficult doing work/chores Not difficult at all Not difficult at all      Social History   Tobacco Use  Smoking Status Former   Types: Cigarettes   Quit date: 01/27/1989   Years since quitting: 33.8  Smokeless Tobacco Former   Types: Chew   Quit date: 09/29/1990   BP Readings from Last 3 Encounters:  10/09/22 115/70  07/01/22 107/69  03/25/22 132/79    Pulse Readings from Last 3 Encounters:  10/09/22 62  07/01/22 68  03/25/22 72   Wt Readings from Last 3 Encounters:  11/04/22 205 lb (93 kg)  10/09/22 205 lb 12.8 oz (93.4 kg)  07/01/22 203 lb 1.6 oz (92.1 kg)   BMI Readings from Last 3 Encounters:  11/04/22 29.84 kg/m  10/09/22 29.96 kg/m  07/01/22 29.41 kg/m    Allergies  Allergen Reactions   Sulfa Antibiotics    Sulfacetamide Sodium     Medications Reviewed Today     Reviewed by Dionisio David, LPN (Licensed Practical Nurse) on 11/04/22 at [phone]  Med List Status: <None>   Medication Order Taking? Sig Documenting Provider Last Dose Status Informant  amLODipine (NORVASC) 5 MG tablet [id number] Yes Take 1 tablet (5 mg total) by mouth daily. Jon Billings, NP Taking Active   benazepril (LOTENSIN) 20 MG tablet [id number] Yes Take 1 tablet (20 mg total) by mouth daily. Jon Billings, NP Taking Active   Blood Glucose Calibration (TRUE METRIX LEVEL 3) High SOLN [id number] Yes  [provider] Taking Active   Blood Glucose Monitoring Suppl (TRUE METRIX METER) w/Device KIT [id number] Yes USE AS DIRECTED Virgil, [place], DO Taking Active   cyanocobalamin 1000 MCG tablet [id number] Yes Take 1,000 mcg by mouth daily. [provider] Taking Active   DROPLET PEN NEEDLES 32G X 4 MM MISC [id number] Yes USE 2 (TWO) TIMES DAILY AS NEEDED. [name], [place] Taking Active   DULoxetine (CYMBALTA) 60 MG capsule [id number] Yes TAKE 1 CAPSULE EVERY DAY Jon Billings, NP Taking Active   empagliflozin (JARDIANCE) 25 MG TABS tablet [id number] Yes Take 1 tablet (25 mg total) by mouth daily. Jon Billings, NP Taking Active   glucose blood (TRUE METRIX BLOOD GLUCOSE TEST) test strip [id number] Yes 1 each by Other route 2 (two) times daily. Use as instructed Mecum, Erin E, PA-C Taking Active   insulin degludec (TRESIBA FLEXTOUCH) 100 UNIT/ML FlexTouch Pen [id number] Yes Inject 18 Units into the skin at bedtime.  Mecum, Dani Gobble,  PA-C Taking Active   metFORMIN (GLUCOPHAGE) 1000 MG tablet [id number] Yes TAKE 1 TABLET (1,000 MG) BY MOUTH 2 TIMES DAILY WITH MEALS Jon Billings, NP Taking Active   rosuvastatin (CRESTOR) 40 MG tablet [id number] Yes TAKE 1 TABLET EVERY DAY Jon Billings, NP Taking Active   traZODone (DESYREL) 50 MG tablet [id number] Yes TAKE 1 TABLET (50 MG TOTAL) BY MOUTH AT BEDTIME AS NEEDED FOR SLEEP. Jon Billings, NP Taking Active   TRUEplus Lancets 28G [place] [id number] Yes 1 each by Other route in the morning and at bedtime. Jon Billings, NP Taking Active             SDOH:  (Social Determinants of Health) assessments and interventions performed: No SDOH Interventions    Flowsheet Row Care Coordination from 11/24/2022 in [place] from 11/04/2022 in [place] Management from 05/05/2022 in [place] Office Visit from 03/25/2022 in [place] from 09/02/2021 in [place] Interventions       Food Insecurity Interventions -- Intervention Not Indicated -- -- Intervention Not Indicated  Housing Interventions -- Intervention Not Indicated -- -- Intervention Not Indicated  Transportation Interventions Intervention Not Indicated Intervention Not Indicated Intervention Not Indicated -- Intervention Not Indicated  Utilities Interventions -- Intervention Not Indicated -- -- --  Alcohol Usage Interventions -- Intervention Not Indicated (Score <7) -- -- --  Depression Interventions/Treatment  -- -- -- PHQ2-9 Score <4 Follow-up Not Indicated --  Financial Strain Interventions Intervention Not Indicated Intervention Not Indicated Intervention Not Indicated -- Intervention Not Indicated  Physical Activity Interventions -- Patient Refused -- -- Intervention Not Indicated  Stress Interventions -- Intervention Not Indicated -- --  Intervention Not Indicated  Social Connections Interventions -- Intervention Not Indicated -- -- Intervention Not Indicated       Medication Assistance:  -Patient makes too much for PAP   Name and location of Current pharmacy:  [facility] [address] [place] [address] Phone: [phone] Fax: [place], [place] [address] [address] Phone: [phone] Fax: [place] [id number], [place] [address] Phone: [phone] Fax: [phone]  [facility] [place], [place] [address] Phone: [phone] Fax: [phone]  [facility] [id number] [place] [place], [place] [address] Phone: [phone] Fax: [phone]   Assessment/Plan  Hypertension (BP goal <130/80) BP Readings from Last 3 Encounters:  10/09/22 115/70  07/01/22 107/69  03/25/22 132/79   -Controlled -Current treatment: Amlodipine 5 mg once daily Appropriate, Effective, Safe, Accessible Benazepril 20 mg once daily Appropriate, Effective, Safe, Accessible -Medications previously tried: none  -Current home readings: at goal -Reviewed for side effects - no problems noted. Denies hypotensive/hypertensive symptoms -Educated on Daily salt intake goal < 2300 mg; Importance of home blood pressure monitoring; Proper BP monitoring technique; -Counseled to monitor BP at home 1-2x/week, document, and provide log at future appointments -Counseled on diet and exercise extensively Recommended to continue current medication   Hyperlipidemia: (LDL goal < 70) The 10-year ASCVD risk score (Arnett DK, et al., 2019) is: 21.6%  Values used to calculate the score:     Age: 70  years     Sex: Male     Is Non-Hispanic African American: No     Diabetic: Yes     Tobacco smoker: No     Systolic Blood Pressure: 123456 mmHg     Is BP treated: Yes     HDL Cholesterol: 53 mg/dL     Total Cholesterol: 152 mg/dL Lab Results  Component Value Date   CHOL 152 10/09/2022   CHOL 151 03/25/2022   CHOL 136 10/02/2021   Lab Results  Component Value Date   HDL 53 10/09/2022   HDL 55 03/25/2022   HDL 52 10/02/2021   Lab Results  Component Value Date   LDLCALC 76 10/09/2022   LDLCALC 76 03/25/2022   LDLCALC 62 10/02/2021   Lab Results  Component Value Date   TRIG 133 10/09/2022   TRIG 109 03/25/2022   TRIG 124 10/02/2021   Lab Results  Component Value Date   CHOLHDL 2.9 10/09/2022   CHOLHDL 2.7 03/25/2022   CHOLHDL 2.6 10/02/2021   No results found for: "LDLDIRECT" Last vitamin D No results found for: "25OHVITD2", "25OHVITD3", "VD25OH" Lab Results  Component Value Date   TSH 2.130 10/09/2022   -Controlled -Current treatment: Rosuvastatin 40 mg once daily Appropriate, Effective, Safe, Accessible -Reviewed for side effects/tolerability - no issues -Educated on Exercise goal of 150 minutes per week; -Counseled on diet and exercise extensively Recommended to continue current medication   Diabetes (A1c goal <8%) Lab Results  Component Value Date   HGBA1C 7.5 (H) 10/09/2022   HGBA1C 7.2 (H) 07/01/2022   HGBA1C 7.6 (H) 03/25/2022   Lab Results  Component Value Date   MICROALBUR 10 10/09/2022   LDLCALC 76 10/09/2022   CREATININE 0.84 10/09/2022    Lab Results  Component Value Date   NA 134 10/09/2022   K 4.6 10/09/2022   CREATININE 0.84 10/09/2022   EGFR 94 10/09/2022   GFRNONAA 92 05/25/2020   GLUCOSE 115 (H) 10/09/2022    Lab Results  Component Value Date   WBC 6.4 10/09/2022   HGB 15.4 10/09/2022   HCT 46.3 10/09/2022   MCV 94 10/09/2022   PLT 366 10/09/2022    Lab Results  Component Value Date   LABMICR Comment 10/09/2022    LABMICR See below: 09/16/2016   MICROALBUR 10 10/09/2022   MICROALBUR 30 (H) 10/09/2021   -Controlled -Current medications: Metformin 1000 mg twice daily Appropriate, Effective, Safe, Accessible Jardiance 25 mg once daily Appropriate, Effective, Safe, Accessible Tresiba Appropriate, Effective, Safe, Accessible -Tried/failed: Trulicity -Current home glucose readings Glucose:  August 2023: AM: 140-155 (Pre-prandial) Lunch: 200 (After food) -Denies hypoglycemic/hyperglycemic symptoms -Current meal patterns: occasional sweets, knows to minimize intake of carbs -Current exercise: active outdoors, golf 2x/week.  August 2023: patient working full time and makes too much for PAP. Is able to afford Jardiance. States has diarrhea 4x/week. Recommend Metformin ER trial Feb 2024: Extensive time was spent counseling patient on the pathophysiology and risk for complications with uncontrolled Diabetes.  Used teach back method for counseling on ADA diabetic diet.  Time spent on explaining role of carbohydrates versus sugars impacting blood glucose levels and meal planning. Current exercise consists of walking 10 minutes three times a week, plan is to increase to 20 minutes 5 days a week and then after 2-3 weeks increase to 30 minute 5 days a week.  -Makes too much for PAP    Depression/Anxiety (Goal: PHQ9<5) -  Controlled -Current treatment: Duloxetine '60mg'$  Appropriate, Effective, Safe, Accessible -Medications previously tried/failed: N/A -PHQ9:     11/04/2022    8:22 AM 10/09/2022   10:24 AM 07/01/2022    9:06 AM  Depression screen PHQ 2/9  Decreased Interest 0 0 0  Down, Depressed, Hopeless 0 0 0  PHQ - 2 Score 0 0 0  Altered sleeping 0 0 0  Tired, decreased energy 0 0 0  Change in appetite 0 0 0  Feeling bad or failure about yourself  0 0 0  Trouble concentrating 0 0 0  Moving slowly or fidgety/restless 0 0 0  Suicidal thoughts 0 0 0  PHQ-9 Score 0 0 0  Difficult doing work/chores Not  difficult at all Not difficult at all    -GAD7:     10/09/2022   10:25 AM 07/01/2022    9:07 AM 03/25/2022    9:46 AM 12/09/2021    9:29 AM  GAD 7 : Generalized Anxiety Score  Nervous, Anxious, on Edge 0 0 0 0  Control/stop worrying 0 0 0 0  Worry too much - different things 0 0 0 0  Trouble relaxing 0 0 0 0  Restless 0 0 0 0  Easily annoyed or irritable 0 0 0 0  Afraid - awful might happen 0 0 0 0  Total GAD 7 Score 0 0 0 0  Anxiety Difficulty Not difficult at all Not difficult at all Not difficult at all Not difficult at all  -Educated on Benefits of medication for symptom control -Recommended to continue current medication  CPP F/U November 2024  [place] Constable, Sherian Rein.D. - [phone]

## 2022-12-08 DIAGNOSIS — Z85828 Personal history of other malignant neoplasm of skin: Secondary | ICD-10-CM | POA: Diagnosis not present

## 2022-12-08 DIAGNOSIS — L814 Other melanin hyperpigmentation: Secondary | ICD-10-CM | POA: Diagnosis not present

## 2022-12-08 DIAGNOSIS — C44311 Basal cell carcinoma of skin of nose: Secondary | ICD-10-CM | POA: Diagnosis not present

## 2022-12-20 ENCOUNTER — Other Ambulatory Visit: Payer: Self-pay | Admitting: Nurse Practitioner

## 2022-12-20 DIAGNOSIS — E119 Type 2 diabetes mellitus without complications: Secondary | ICD-10-CM

## 2022-12-20 DIAGNOSIS — I10 Essential (primary) hypertension: Secondary | ICD-10-CM

## 2022-12-22 NOTE — Telephone Encounter (Signed)
Rx requested by [facility] mail order- only 2 not sent there Will forward Amlodipine and benazepril  Requested Prescriptions  Pending Prescriptions Disp Refills   amLODipine (NORVASC) 5 MG tablet [Pharmacy Med Name: AMLODIPINE BESYLATE 5 MG Tablet] 90 tablet 3    Sig: TAKE 1 TABLET EVERY DAY     Cardiovascular: Calcium Channel Blockers 2 Passed - 12/20/2022  2:57 AM      Passed - Last BP in normal range    BP Readings from Last 1 Encounters:  10/09/22 115/70         Passed - Last Heart Rate in normal range    Pulse Readings from Last 1 Encounters:  10/09/22 62         Passed - Valid encounter within last 6 months    Recent Outpatient Visits           2 months ago Encounter for annual wellness exam in Medicare patient   [place] Jon Billings, NP   5 months ago Type II diabetes mellitus with neurological manifestations ([place])   [place], Erin E, PA-C   9 months ago Hypertension associated with diabetes [facility])   [place] Jon Billings, NP   1 year ago Essential hypertension   [place] Vigg, Avanti, MD   1 year ago Diabetes mellitus without complication [facility])   [place] Vigg, Avanti, MD       Future Appointments             In 2 months Jon Billings, NP [place], [facility]             DULoxetine (CYMBALTA) 60 MG capsule [Pharmacy Med Name: DULOXETINE HYDROCHLORIDE 60 MG Capsule Delayed Release Particles] 90 capsule 3    Sig: TAKE [place]     Psychiatry: Antidepressants - SNRI - duloxetine Passed - 12/20/2022  2:57 AM      Passed - Cr in normal range and within 360 days    Creatinine, Ser  Date Value Ref Range Status  10/09/2022 0.84 0.76 - 1.27 mg/dL Final         Passed - eGFR is 30 or above and within 360 days    GFR calc Af Amer  Date Value Ref Range Status  05/25/2020 107  >59 mL/min/1.73 Final    Comment:    **[facility] currently reports eGFR in compliance with the current**   recommendations of the [employer]. [facility] will   update reporting as new guidelines are published from the NKF-ASN   Task force.    GFR calc non Af Amer  Date Value Ref Range Status  05/25/2020 92 >59 mL/min/1.73 Final   eGFR  Date Value Ref Range Status  10/09/2022 94 >59 mL/min/1.73 Final         Passed - Completed PHQ-2 or PHQ-9 in the last 360 days      Passed - Last BP in normal range    BP Readings from Last 1 Encounters:  10/09/22 115/70         Passed - Valid encounter within last 6 months    Recent Outpatient Visits           2 months ago Encounter for annual wellness exam in Medicare patient   [place], NP   5 months ago Type II diabetes mellitus with neurological manifestations ([place])  [place], Dani Gobble, PA-C   9 months ago Hypertension associated with diabetes [facility])   [place] Jon Billings, NP   1 year ago Essential hypertension   [place] Vigg, Avanti, MD   1 year ago Diabetes mellitus without complication ([place])   [place] Vigg, Avanti, MD       Future Appointments             In 2 months Jon Billings, NP [place], [facility]             benazepril (LOTENSIN) 20 MG tablet [Pharmacy Med Name: BENAZEPRIL HYDROCHLORIDE 20 MG Tablet] 90 tablet 3    Sig: TAKE 1 TABLET EVERY DAY     Cardiovascular:  ACE Inhibitors Passed - 12/20/2022  2:57 AM      Passed - Cr in normal range and within 180 days    Creatinine, Ser  Date Value Ref Range Status  10/09/2022 0.84 0.76 - 1.27 mg/dL Final         Passed - K in normal range and within 180 days    Potassium  Date Value Ref Range Status  10/09/2022 4.6 3.5 - 5.2 mmol/L Final         Passed -  Patient is not pregnant      Passed - Last BP in normal range    BP Readings from Last 1 Encounters:  10/09/22 115/70         Passed - Valid encounter within last 6 months    Recent Outpatient Visits           2 months ago Encounter for annual wellness exam in Medicare patient   [place] Jon Billings, NP   5 months ago Type II diabetes mellitus with neurological manifestations ([place])   [place], Erin E, PA-C   9 months ago Hypertension associated with diabetes ([place])   [place] Jon Billings, NP   1 year ago Essential hypertension   [place] Vigg, Avanti, MD   1 year ago Diabetes mellitus without complication [facility])   [place] Vigg, Avanti, MD       Future Appointments             In 2 months Jon Billings, NP [place], [facility]             metFORMIN (GLUCOPHAGE) 1000 MG tablet [Pharmacy Med Name: METFORMIN HYDROCHLORIDE 1000 MG Tablet] 180 tablet 3    Sig: TAKE 1 [place]     Endocrinology:  Diabetes - Biguanides Passed - 12/20/2022  2:57 AM      Passed - Cr in normal range and within 360 days    Creatinine, Ser  Date Value Ref Range Status  10/09/2022 0.84 0.76 - 1.27 mg/dL Final         Passed - HBA1C is between 0 and 7.9 and within 180 days    Hemoglobin A1C  Date Value Ref Range Status  05/22/2016 7.6  Final   HB A1C (BAYER DCA - WAIVED)  Date Value Ref Range Status  07/01/2022 7.2 (H) 4.8 - 5.6 % Final    Comment:             Prediabetes: 5.7 - 6.4  Diabetes: >6.4          Glycemic control for adults with diabetes: <7.0    Hgb A1c MFr Bld  Date Value Ref Range Status  10/09/2022 7.5 (H) 4.8 - 5.6 % Final    Comment:             Prediabetes: 5.7 - 6.4          Diabetes: >6.4          Glycemic control for adults with diabetes: <7.0           Passed - eGFR in normal range and within 360 days    GFR calc Af Amer  Date Value Ref Range Status  05/25/2020 107 >59 mL/min/1.73 Final    Comment:    **[facility] currently reports eGFR in compliance with the current**   recommendations of the [employer]. [facility] will   update reporting as new guidelines are published from the NKF-ASN   Task force.    GFR calc non Af Amer  Date Value Ref Range Status  05/25/2020 92 >59 mL/min/1.73 Final   eGFR  Date Value Ref Range Status  10/09/2022 94 >59 mL/min/1.73 Final         Passed - B12 Level in normal range and within 720 days    Vitamin B-12  Date Value Ref Range Status  10/09/2022 530 232 - 1,245 pg/mL Final         Passed - Valid encounter within last 6 months    Recent Outpatient Visits           2 months ago Encounter for annual wellness exam in Medicare patient   [place] Jon Billings, NP   5 months ago Type II diabetes mellitus with neurological manifestations ([place])   [place] Mecum, Erin E, PA-C   9 months ago Hypertension associated with diabetes ([place])   [place] Jon Billings, NP   1 year ago Essential hypertension   [place] Vigg, Avanti, MD   1 year ago Diabetes mellitus without complication [facility])   [place] Vigg, Avanti, MD       Future Appointments             In 2 months Jon Billings, NP [place], [facility]            Passed - CBC within normal limits and completed in the last 12 months    WBC  Date Value Ref Range Status  10/09/2022 6.4 3.4 - 10.8 x10E3/uL Final   RBC  Date Value Ref Range Status  10/09/2022 4.92 4.14 - 5.80 x10E6/uL Final   Hemoglobin  Date Value Ref Range Status  10/09/2022 15.4 13.0 - 17.7 g/dL Final   Hematocrit  Date Value Ref Range Status  10/09/2022 46.3 37.5 - 51.0 % Final    MCHC  Date Value Ref Range Status  10/09/2022 33.3 31.5 - 35.7 g/dL Final   [facility]  Date Value Ref Range Status  10/09/2022 31.3 26.6 - 33.0 pg Final   MCV  Date Value Ref Range Status  10/09/2022 94 79 - 97 fL Final   No results found for: "PLTCOUNTKUC", "LABPLAT", "POCPLA" RDW  Date Value Ref Range Status  10/09/2022 12.3 11.6 - 15.4 % Final

## 2023-01-01 ENCOUNTER — Telehealth: Payer: Self-pay

## 2023-01-01 NOTE — Progress Notes (Cosign Needed)
Care Management & Coordination Services Pharmacy Team  Reason for Encounter: Diabetes  Contacted patient to discuss diabetes disease state. Spoke with patient on 01/01/2023    Recent office visits:  None noted  Recent consult visits:  12/08/22-Bradley Mitchell Heir, MD (Dermatology) Seen for basal cell carcinoma.  Hospital visits:  None in previous 6 months  Medications: Outpatient Encounter Medications as of 01/01/2023  Medication Sig   amLODipine (NORVASC) 5 MG tablet Take 1 tablet (5 mg total) by mouth daily.   benazepril (LOTENSIN) 20 MG tablet TAKE 1 TABLET EVERY DAY   Blood Glucose Calibration (TRUE METRIX LEVEL 3) High SOLN    Blood Glucose Monitoring Suppl (TRUE METRIX METER) w/Device KIT USE AS DIRECTED   cyanocobalamin 1000 MCG tablet Take 1,000 mcg by mouth daily.   DROPLET PEN NEEDLES 32G X 4 MM MISC USE 2 (TWO) TIMES DAILY AS NEEDED.   DULoxetine (CYMBALTA) 60 MG capsule TAKE 1 CAPSULE EVERY DAY   empagliflozin (JARDIANCE) 25 MG TABS tablet Take 1 tablet (25 mg total) by mouth daily.   glucose blood (TRUE METRIX BLOOD GLUCOSE TEST) test strip 1 each by Other route 2 (two) times daily. Use as instructed   insulin degludec (TRESIBA FLEXTOUCH) 100 UNIT/ML FlexTouch Pen INJECT 16 UNITS UNDER THE SKIN AT BEDTIME   metFORMIN (GLUCOPHAGE) 1000 MG tablet TAKE 1 TABLET (1,000 MG) BY MOUTH 2 TIMES DAILY WITH MEALS   rosuvastatin (CRESTOR) 40 MG tablet TAKE 1 TABLET EVERY DAY   traZODone (DESYREL) 50 MG tablet TAKE 1 TABLET (50 MG TOTAL) BY MOUTH AT BEDTIME AS NEEDED FOR SLEEP.   TRUEplus Lancets 28G MISC 1 each by Other route in the morning and at bedtime.   No facility-administered encounter medications on file as of 01/01/2023.    Recent Relevant Labs: Lab Results  Component Value Date/Time   HGBA1C 7.5 (H) 10/09/2022 10:41 AM   HGBA1C 7.2 (H) 07/01/2022 09:30 AM   HGBA1C 7.6 (H) 03/25/2022 10:08 AM   HGBA1C 7.8 (H) 12/02/2021 09:28 AM   HGBA1C 7.6 05/22/2016 12:00 AM    MICROALBUR 10 10/09/2022 10:36 AM   MICROALBUR 30 (H) 10/09/2021 09:09 AM    Kidney Function Lab Results  Component Value Date/Time   CREATININE 0.84 10/09/2022 10:41 AM   CREATININE 0.75 (L) 03/25/2022 10:08 AM   GFRNONAA 92 05/25/2020 02:34 PM   GFRAA 107 05/25/2020 02:34 PM   Current antihyperglycemic regimen:  Jardiance 25 mg take 1 tablet daily Tresiba 100 units inject 16 units at bedtime Metformin 1000 mg take 1 tablet twice daily   Patient verbally confirms he is taking the above medications as directed. Yes  What diet changes have been made to improve diabetes control? Patient states he eats about everything.  What recent interventions/DTPs have been made to improve glycemic control:  None noted  Have there been any recent hospitalizations or ED visits since last visit with PharmD? No  Patient denies hypoglycemic symptoms, including Pale, Sweaty, Shaky, Hungry, Nervous/irritable, and Vision changes  Patient denies hyperglycemic symptoms, including blurry vision, excessive thirst, fatigue, polyuria, and weakness  How often are you checking your blood sugar? twice daily  What are your blood sugars ranging?  Fasting: 135-150 Before meals:  After meals: 180 Bedtime:   During the week, how often does your blood glucose drop below 70? Never  Are you checking your feet daily/regularly? Yes  Adherence Review: Is the patient currently on a STATIN medication? Yes Is the patient currently on ACE/ARB medication? No Does the  patient have >5 day gap between last estimated fill dates? No  Star Rating Drugs:  Jardiance 25 mg Last filled:12/27/22 90 DS, 10/20/22 90 DS Tresiba 100 units Last filled:11/04/22 90 DS, 08/22/22 90 DS Metformin 1000 mg Last filled:10/08/22 90 DS, 08/08/22 90 DS Rosuvastatin 40 mg Last filled:10/23/22 90 DS, 06/01/22 90 DS  Care Gaps: Annual wellness visit in last year? Yes Last eye exam / retinopathy screening:09/19/22 Last diabetic foot  exam:10/09/22   Corrie Mckusick, RMA

## 2023-01-04 ENCOUNTER — Other Ambulatory Visit: Payer: Self-pay | Admitting: Physician Assistant

## 2023-01-04 DIAGNOSIS — E1149 Type 2 diabetes mellitus with other diabetic neurological complication: Secondary | ICD-10-CM

## 2023-01-05 NOTE — Telephone Encounter (Signed)
Requested Prescriptions  Pending Prescriptions Disp Refills   TRUE METRIX BLOOD GLUCOSE TEST test strip [Pharmacy Med Name: TRUE METRIX SELF MONITORING BLOOD GLUCOSE STRIPS   Strip] 200 strip 3    Sig: TEST BLOOD SUGAR TWICE DAILY USE AS INSTRUCTED     Endocrinology: Diabetes - Testing Supplies Passed - 01/04/2023  3:31 AM      Passed - Valid encounter within last 12 months    Recent Outpatient Visits           2 months ago Encounter for annual wellness exam in Medicare patient   [place] [facility] Larae Grooms, NP   6 months ago Type II diabetes mellitus with neurological manifestations (HCC)   [place] [facility] Mecum, Erin E, PA-C   9 months ago Hypertension associated with diabetes [facility])   [place] [facility] Larae Grooms, NP   1 year ago Essential hypertension   [place] [facility] Vigg, Avanti, MD   1 year ago Diabetes mellitus without complication [place] [facility])   [place] [facility] Vigg, Avanti, MD       Future Appointments             In 2 months Larae Grooms, NP [place] [facility], [facility]

## 2023-01-16 ENCOUNTER — Other Ambulatory Visit: Payer: Self-pay | Admitting: Nurse Practitioner

## 2023-01-16 DIAGNOSIS — E1149 Type 2 diabetes mellitus with other diabetic neurological complication: Secondary | ICD-10-CM

## 2023-01-16 NOTE — Telephone Encounter (Signed)
Requested Prescriptions  Pending Prescriptions Disp Refills   TRESIBA FLEXTOUCH 100 UNIT/ML FlexTouch Pen [Pharmacy Med Name: TRESIBA FLEXTOUCH 100 UNIT/ML Solution Pen-injector] 15 mL 0    Sig: INJECT 16 UNITS UNDER THE SKIN AT BEDTIME     Endocrinology:  Diabetes - Insulins Passed - 01/16/2023  2:35 AM      Passed - HBA1C is between 0 and 7.9 and within 180 days    Hemoglobin A1C  Date Value Ref Range Status  05/22/2016 7.6  Final   HB A1C (BAYER DCA - WAIVED)  Date Value Ref Range Status  07/01/2022 7.2 (H) 4.8 - 5.6 % Final    Comment:             Prediabetes: 5.7 - 6.4          Diabetes: >6.4          Glycemic control for adults with diabetes: <7.0    Hgb A1c MFr Bld  Date Value Ref Range Status  10/09/2022 7.5 (H) 4.8 - 5.6 % Final    Comment:             Prediabetes: 5.7 - 6.4          Diabetes: >6.4          Glycemic control for adults with diabetes: <7.0          Passed - Valid encounter within last 6 months    Recent Outpatient Visits           3 months ago Encounter for annual wellness exam in Medicare patient   [place] [facility] Larae Grooms, NP   6 months ago Type II diabetes mellitus with neurological manifestations (HCC)   [place] [facility] Mecum, Erin E, PA-C   9 months ago Hypertension associated with diabetes [facility])   [place] [facility] Larae Grooms, NP   1 year ago Essential hypertension   [place] [facility] Vigg, Avanti, MD   1 year ago Diabetes mellitus without complication [facility])   [place] [facility] Vigg, Avanti, MD       Future Appointments             In 1 month Larae Grooms, NP [place] [facility], [facility]

## 2023-01-19 DIAGNOSIS — Z85828 Personal history of other malignant neoplasm of skin: Secondary | ICD-10-CM | POA: Diagnosis not present

## 2023-01-19 DIAGNOSIS — Z08 Encounter for follow-up examination after completed treatment for malignant neoplasm: Secondary | ICD-10-CM | POA: Diagnosis not present

## 2023-03-10 ENCOUNTER — Encounter: Payer: Self-pay | Admitting: Nurse Practitioner

## 2023-03-10 ENCOUNTER — Ambulatory Visit (INDEPENDENT_AMBULATORY_CARE_PROVIDER_SITE_OTHER): Payer: Medicare HMO | Admitting: Nurse Practitioner

## 2023-03-10 VITALS — BP 127/74 | HR 66 | Temp 97.9°F | Wt 205.4 lb

## 2023-03-10 DIAGNOSIS — E538 Deficiency of other specified B group vitamins: Secondary | ICD-10-CM

## 2023-03-10 DIAGNOSIS — Z794 Long term (current) use of insulin: Secondary | ICD-10-CM | POA: Diagnosis not present

## 2023-03-10 DIAGNOSIS — G4700 Insomnia, unspecified: Secondary | ICD-10-CM | POA: Diagnosis not present

## 2023-03-10 DIAGNOSIS — E119 Type 2 diabetes mellitus without complications: Secondary | ICD-10-CM

## 2023-03-10 DIAGNOSIS — E1159 Type 2 diabetes mellitus with other circulatory complications: Secondary | ICD-10-CM

## 2023-03-10 DIAGNOSIS — E785 Hyperlipidemia, unspecified: Secondary | ICD-10-CM

## 2023-03-10 DIAGNOSIS — E1149 Type 2 diabetes mellitus with other diabetic neurological complication: Secondary | ICD-10-CM | POA: Diagnosis not present

## 2023-03-10 DIAGNOSIS — I152 Hypertension secondary to endocrine disorders: Secondary | ICD-10-CM

## 2023-03-10 DIAGNOSIS — I10 Essential (primary) hypertension: Secondary | ICD-10-CM | POA: Diagnosis not present

## 2023-03-10 MED ORDER — ROSUVASTATIN CALCIUM 40 MG PO TABS: 40.0000 mg | ORAL_TABLET | Freq: Every day | ORAL | 1 refills | Status: DC

## 2023-03-10 MED ORDER — BENAZEPRIL HCL 20 MG PO TABS
20.0000 mg | ORAL_TABLET | Freq: Every day | ORAL | 1 refills | Status: DC
Start: 2023-03-10 — End: 2023-09-14

## 2023-03-10 MED ORDER — TRESIBA FLEXTOUCH 100 UNIT/ML ~~LOC~~ SOPN: 18.0000 [IU] | PEN_INJECTOR | Freq: Every day | SUBCUTANEOUS | 1 refills | Status: DC

## 2023-03-10 MED ORDER — METFORMIN HCL 1000 MG PO TABS: 1000.0000 mg | ORAL_TABLET | Freq: Two times a day (BID) | ORAL | 1 refills | Status: DC

## 2023-03-10 MED ORDER — AMLODIPINE BESYLATE 5 MG PO TABS: 5.0000 mg | ORAL_TABLET | Freq: Every day | ORAL | 1 refills | Status: DC

## 2023-03-10 MED ORDER — TRAZODONE HCL 50 MG PO TABS
50.0000 mg | ORAL_TABLET | Freq: Every evening | ORAL | 1 refills | Status: DC | PRN
Start: 2023-03-10 — End: 2023-08-04

## 2023-03-10 MED ORDER — EMPAGLIFLOZIN 25 MG PO TABS: 25.0000 mg | ORAL_TABLET | Freq: Every day | ORAL | 1 refills | Status: DC

## 2023-03-10 MED ORDER — DULOXETINE HCL 60 MG PO CPEP: 60.0000 mg | ORAL_CAPSULE | Freq: Every day | ORAL | 1 refills | Status: DC

## 2023-03-10 MED ORDER — OZEMPIC (0.25 OR 0.5 MG/DOSE) 2 MG/1.5ML ~~LOC~~ SOPN: 0.2500 mg | PEN_INJECTOR | SUBCUTANEOUS | 2 refills | Status: DC

## 2023-03-10 NOTE — Assessment & Plan Note (Signed)
Chronic.  Controlled.  Continue with current medication regimen on Amlodipine 5mg  and Benzapril 40mg  daily.  Labs ordered today.  Refills sent today.  Return to clinic in 7 months for reevaluation.  Call sooner if concerns arise.  Continue to check blood pressures at home.

## 2023-03-10 NOTE — Assessment & Plan Note (Signed)
Chronic.  Controlled.  Continue with current medication regimen of Crestor 40mg  daily.  Refills sent today.  Labs ordered today.  Return to clinic in 7 months for reevaluation.  Call sooner if concerns arise.

## 2023-03-10 NOTE — Progress Notes (Signed)
BP 127/74   Pulse 66   Temp 97.9 F (36.6 C) (Oral)   Wt 205 lb 6.4 oz (93.2 kg)   SpO2 94%   BMI 29.90 kg/m    Subjective:    Patient ID: Mark Allen, male    DOB: June 04, 1951, 72 y.o.   MRN: [id number]  HPI: Mark Allen is a 72 y.o. male  Chief Complaint  Patient presents with   Hypertension   Hyperlipidemia   Diabetes   DIABETES Hypoglycemic episodes:no Polydipsia/polyuria: increased urination Visual disturbance: no Chest pain: no Paresthesias: yes Glucose Monitoring: yes  Accucheck frequency: Daily  Fasting glucose: 130  Post prandial:  Evening:  Before meals: Taking Insulin?: yes  Long acting insulin: Tresiba 18u  Short acting insulin: Blood Pressure Monitoring: not checking Retinal Examination: not up to date- need to make an appt Foot Exam: Up to Date Diabetic Education: Not Completed Pneumovax: Up to Date Influenza: Not up to Date Aspirin: no  HYPERTENSION / HYPERLIPIDEMIA Satisfied with current treatment? no Duration of hypertension: years BP monitoring frequency: not checking BP range:  BP medication side effects: no Past BP meds: amlodipine and benazepril Duration of hyperlipidemia: years Cholesterol medication side effects: no Cholesterol supplements: none Past cholesterol medications: rosuvastatin (crestor) Medication compliance: excellent compliance Aspirin: no Recent stressors: no Recurrent headaches: no Visual changes: no Palpitations: no Dyspnea: no Chest pain: no Lower extremity edema: no Dizzy/lightheaded: no  Relevant past medical, surgical, family and social history reviewed and updated as indicated. Interim medical history since our last visit reviewed. Allergies and medications reviewed and updated.  Review of Systems  Eyes:  Negative for visual disturbance.  Respiratory:  Negative for chest tightness and shortness of breath.   Cardiovascular:  Negative for chest pain, palpitations and leg swelling.  Endocrine:  Positive for polyuria. Negative for polydipsia.  Neurological:  Negative for dizziness, light-headedness, numbness and headaches.    Per HPI unless specifically indicated above     Objective:    BP 127/74   Pulse 66   Temp 97.9 F (36.6 C) (Oral)   Wt 205 lb 6.4 oz (93.2 kg)   SpO2 94%   BMI 29.90 kg/m   Wt Readings from Last 3 Encounters:  03/10/23 205 lb 6.4 oz (93.2 kg)  11/04/22 205 lb (93 kg)  10/09/22 205 lb 12.8 oz (93.4 kg)    Physical Exam Vitals and nursing note reviewed.  Constitutional:      General: He is not in acute distress.    Appearance: Normal appearance. He is not ill-appearing, toxic-appearing or diaphoretic.  HENT:     Head: Normocephalic.     Right Ear: External ear normal.     Left Ear: External ear normal.     Nose: Nose normal. No congestion or rhinorrhea.     Mouth/Throat:     Mouth: Mucous membranes are moist.  Eyes:     General:        Right eye: No discharge.        Left eye: No discharge.     Extraocular Movements: Extraocular movements intact.     Conjunctiva/sclera: Conjunctivae normal.     Pupils: Pupils are equal, round, and reactive to light.  Cardiovascular:     Rate and Rhythm: Normal rate and regular rhythm.     Heart sounds: No murmur heard. Pulmonary:     Effort: Pulmonary effort is normal. No respiratory distress.     Breath sounds: Normal breath sounds. No wheezing, rhonchi or rales.  Abdominal:     General: Abdomen is flat. Bowel sounds are normal.  Musculoskeletal:     Cervical back: Normal range of motion and neck supple.  Skin:    General: Skin is warm and dry.     Capillary Refill: Capillary refill takes less than 2 seconds.  Neurological:     General: No focal deficit present.     Mental Status: He is alert and oriented to person, place, and time.  Psychiatric:        Mood and Affect: Mood normal.        Behavior: Behavior normal.        Thought Content: Thought content normal.        Judgment: Judgment  normal.     Results for orders placed or performed in visit on 10/09/22  TSH  Result Value Ref Range   TSH 2.130 0.450 - 4.500 uIU/mL  PSA  Result Value Ref Range   Prostate Specific Ag, Serum 0.2 0.0 - 4.0 ng/mL  Lipid panel  Result Value Ref Range   Cholesterol, Total 152 100 - 199 mg/dL   Triglycerides 161 0 - 149 mg/dL   HDL 53 >09 mg/dL   VLDL Cholesterol Cal 23 5 - 40 mg/dL   LDL Chol Calc (NIH) 76 0 - 99 mg/dL   Chol/HDL Ratio 2.9 0.0 - 5.0 ratio  CBC with Differential/Platelet  Result Value Ref Range   WBC 6.4 3.4 - 10.8 x10E3/uL   RBC 4.92 4.14 - 5.80 x10E6/uL   Hemoglobin 15.4 13.0 - 17.7 g/dL   Hematocrit 60.4 54.0 - 51.0 %   MCV 94 79 - 97 fL   MCH 31.3 26.6 - 33.0 pg   MCHC 33.3 31.5 - 35.7 g/dL   RDW 98.1 19.1 - 47.8 %   Platelets 366 150 - 450 x10E3/uL   Neutrophils 52 Not Estab. %   Lymphs 30 Not Estab. %   Monocytes 13 Not Estab. %   Eos 4 Not Estab. %   Basos 1 Not Estab. %   Neutrophils Absolute 3.4 1.4 - 7.0 x10E3/uL   Lymphocytes Absolute 1.9 0.7 - 3.1 x10E3/uL   Monocytes Absolute 0.8 0.1 - 0.9 x10E3/uL   EOS (ABSOLUTE) 0.2 0.0 - 0.4 x10E3/uL   Basophils Absolute 0.0 0.0 - 0.2 x10E3/uL   Immature Granulocytes 0 Not Estab. %   Immature Grans (Abs) 0.0 0.0 - 0.1 x10E3/uL  Comprehensive metabolic panel  Result Value Ref Range   Glucose 115 (H) 70 - 99 mg/dL   BUN 19 8 - 27 mg/dL   Creatinine, Ser 2.95 0.76 - 1.27 mg/dL   eGFR 94 >62 ZH/YQM/5.78   BUN/Creatinine Ratio 23 10 - 24   Sodium 134 134 - 144 mmol/L   Potassium 4.6 3.5 - 5.2 mmol/L   Chloride 98 96 - 106 mmol/L   CO2 24 20 - 29 mmol/L   Calcium 9.6 8.6 - 10.2 mg/dL   Total Protein 7.0 6.0 - 8.5 g/dL   Albumin 4.6 3.9 - 4.9 g/dL   Globulin, Total 2.4 1.5 - 4.5 g/dL   Albumin/Globulin Ratio 1.9 1.2 - 2.2   Bilirubin Total 0.4 0.0 - 1.2 mg/dL   Alkaline Phosphatase 82 44 - 121 IU/L   AST 17 0 - 40 IU/L   ALT 20 0 - 44 IU/L  Urinalysis, Routine w reflex microscopic  Result Value  Ref Range   Specific Gravity, UA 1.015 1.005 - 1.030   pH, UA 6.0 5.0 - 7.5  Color, UA Yellow Yellow   Appearance Ur Clear Clear   Leukocytes,UA Negative Negative   Protein,UA Negative Negative/Trace   Glucose, UA 3+ (A) Negative   Ketones, UA Trace (A) Negative   RBC, UA Negative Negative   Bilirubin, UA Negative Negative   Urobilinogen, Ur 2.0 (H) 0.2 - 1.0 mg/dL   Nitrite, UA Negative Negative   Microscopic Examination Comment   HgB A1c  Result Value Ref Range   Hgb A1c MFr Bld 7.5 (H) 4.8 - 5.6 %   Est. average glucose Bld gHb Est-mCnc 169 mg/dL  Z61  Result Value Ref Range   Vitamin B-12 530 232 - 1,245 pg/mL  Microalbumin, Urine Waived  Result Value Ref Range   Microalb, Ur Waived 10 0 - 19 mg/L   Creatinine, Urine Waived 50 10 - 300 mg/dL   Microalb/Creat Ratio 30-300 (H) <30 mg/g      Assessment & Plan:   Problem List Items Addressed This Visit       Cardiovascular and Mediastinum   Hypertension associated with diabetes (HCC) - Primary    Chronic.  Controlled.  Continue with current medication regimen on Amlodipine 5mg  and Benzapril 40mg  daily.  Labs ordered today.  Refills sent today.  Return to clinic in 7 months for reevaluation.  Call sooner if concerns arise.  Continue to check blood pressures at home.       Relevant Medications   insulin degludec (TRESIBA FLEXTOUCH) 100 UNIT/ML FlexTouch Pen   Semaglutide,0.25 or 0.5MG /DOS, (OZEMPIC, 0.25 OR 0.5 MG/DOSE,) 2 MG/1.5ML SOPN   amLODipine (NORVASC) 5 MG tablet   benazepril (LOTENSIN) 20 MG tablet   empagliflozin (JARDIANCE) 25 MG TABS tablet   metFORMIN (GLUCOPHAGE) 1000 MG tablet   rosuvastatin (CRESTOR) 40 MG tablet   Other Relevant Orders   Comp Met (CMET)   Essential hypertension    Chronic.  Controlled.  Continue with current medication regimen on Amlodipine 5mg  and Benzapril 40mg  daily.  Labs ordered today.  Refills sent today.  Return to clinic in 7 months for reevaluation.  Call sooner if concerns  arise.        Relevant Medications   amLODipine (NORVASC) 5 MG tablet   benazepril (LOTENSIN) 20 MG tablet   rosuvastatin (CRESTOR) 40 MG tablet     Endocrine   Type II diabetes mellitus with neurological manifestations (HCC)    Chronic, stable. Currently taking 18 units tresiba daily, jardiance 25mg ,and  metformin 1,000mg  BID. Labs ordered today.  Will add Ozempic to regimen.  Discussed side effects and benefits of medication during visit today.  Sees improvement with Duloxetine daily.  Continue current regimen. Refills sent to the pharmacy. Follow-up in 7 months.       Relevant Medications   insulin degludec (TRESIBA FLEXTOUCH) 100 UNIT/ML FlexTouch Pen   Semaglutide,0.25 or 0.5MG /DOS, (OZEMPIC, 0.25 OR 0.5 MG/DOSE,) 2 MG/1.5ML SOPN   benazepril (LOTENSIN) 20 MG tablet   empagliflozin (JARDIANCE) 25 MG TABS tablet   metFORMIN (GLUCOPHAGE) 1000 MG tablet   rosuvastatin (CRESTOR) 40 MG tablet   Other Relevant Orders   HgB A1c     Other   Hyperlipidemia    Chronic.  Controlled.  Continue with current medication regimen of Crestor 40mg  daily.  Refills sent today.  Labs ordered today.  Return to clinic in 7 months for reevaluation.  Call sooner if concerns arise.        Relevant Medications   amLODipine (NORVASC) 5 MG tablet   benazepril (LOTENSIN) 20 MG tablet  rosuvastatin (CRESTOR) 40 MG tablet   Other Relevant Orders   Lipid Profile   Insomnia    Chronic.  Controlled.  Continue with current medication regimen of Trazodone.  Labs ordered today.  Return to clinic in 6 months for reevaluation.  Call sooner if concerns arise.        Relevant Medications   traZODone (DESYREL) 50 MG tablet   Vitamin B 12 deficiency    Labs ordered at visit today.  Will make recommendations based on lab results.        Relevant Orders   B12   Other Visit Diagnoses     Diabetes mellitus without complication (HCC)       Relevant Medications   insulin degludec (TRESIBA FLEXTOUCH) 100  UNIT/ML FlexTouch Pen   Semaglutide,0.25 or 0.5MG /DOS, (OZEMPIC, 0.25 OR 0.5 MG/DOSE,) 2 MG/1.5ML SOPN   benazepril (LOTENSIN) 20 MG tablet   empagliflozin (JARDIANCE) 25 MG TABS tablet   metFORMIN (GLUCOPHAGE) 1000 MG tablet   rosuvastatin (CRESTOR) 40 MG tablet        Follow up plan: Return in about 7 months (around 10/10/2023) for Physical and Fasting labs.

## 2023-03-10 NOTE — Assessment & Plan Note (Signed)
Labs ordered at visit today.  Will make recommendations based on lab results.   

## 2023-03-10 NOTE — Assessment & Plan Note (Signed)
Chronic.  Controlled.  Continue with current medication regimen on Amlodipine 5mg  and Benzapril 40mg  daily.  Labs ordered today.  Refills sent today.  Return to clinic in 7 months for reevaluation.  Call sooner if concerns arise.

## 2023-03-10 NOTE — Assessment & Plan Note (Signed)
Chronic.  Controlled.  Continue with current medication regimen of Trazodone.  Labs ordered today.  Return to clinic in 6 months for reevaluation.  Call sooner if concerns arise.   

## 2023-03-10 NOTE — Assessment & Plan Note (Signed)
Chronic, stable. Currently taking 18 units tresiba daily, jardiance 25mg ,and  metformin 1,000mg  BID. Labs ordered today.  Will add Ozempic to regimen.  Discussed side effects and benefits of medication during visit today.  Sees improvement with Duloxetine daily.  Continue current regimen. Refills sent to the pharmacy. Follow-up in 7 months.

## 2023-03-11 LAB — COMPREHENSIVE METABOLIC PANEL
ALT: 18 IU/L (ref 0–44)
AST: 12 IU/L (ref 0–40)
Albumin/Globulin Ratio: 2
Albumin: 4.5 g/dL (ref 3.9–4.9)
Alkaline Phosphatase: 80 IU/L (ref 44–121)
BUN/Creatinine Ratio: 20 (ref 10–24)
BUN: 16 mg/dL (ref 8–27)
Bilirubin Total: 0.3 mg/dL (ref 0.0–1.2)
CO2: 22 mmol/L (ref 20–29)
Calcium: 9.5 mg/dL (ref 8.6–10.2)
Chloride: 101 mmol/L (ref 96–106)
Creatinine, Ser: 0.82 mg/dL (ref 0.76–1.27)
Globulin, Total: 2.3 g/dL (ref 1.5–4.5)
Glucose: 156 mg/dL — ABNORMAL HIGH (ref 70–99)
Potassium: 4.8 mmol/L (ref 3.5–5.2)
Sodium: 138 mmol/L (ref 134–144)
Total Protein: 6.8 g/dL (ref 6.0–8.5)
eGFR: 94 mL/min/{1.73_m2} (ref >59)

## 2023-03-11 LAB — LIPID PANEL
Chol/HDL Ratio: 2.8 ratio (ref 0.0–5.0)
Cholesterol, Total: 138 mg/dL (ref 100–199)
HDL: 50 mg/dL (ref >39)
LDL Chol Calc (NIH): 64 mg/dL (ref 0–99)
Triglycerides: 139 mg/dL (ref 0–149)
VLDL Cholesterol Cal: 24 mg/dL (ref 5–40)

## 2023-03-11 LAB — VITAMIN B12: Vitamin B-12: 465 pg/mL (ref 232–1245)

## 2023-03-11 LAB — HEMOGLOBIN A1C
Est. average glucose Bld gHb Est-mCnc: 166 mg/dL
Hgb A1c MFr Bld: 7.4 % — ABNORMAL HIGH (ref 4.8–5.6)

## 2023-03-11 NOTE — Progress Notes (Signed)
Hi Mark Allen. It was nice to see you yesterday.  Your lab work looks good. Your A1c remains well controlled at 7.4%.  No concerns at this time. Continue with your current medication regimen.  Follow up as discussed.  Please let me know if you have any questions.

## 2023-03-20 ENCOUNTER — Other Ambulatory Visit: Payer: Self-pay | Admitting: Nurse Practitioner

## 2023-03-20 DIAGNOSIS — H8112 Benign paroxysmal vertigo, left ear: Secondary | ICD-10-CM | POA: Diagnosis not present

## 2023-03-20 DIAGNOSIS — G4700 Insomnia, unspecified: Secondary | ICD-10-CM

## 2023-03-20 NOTE — Telephone Encounter (Signed)
Unable to refill per protocol, Rx request is too soon. E-Prescribing Status: Receipt confirmed by pharmacy (03/10/2023  8:53 AM EDT).  Requested Prescriptions  Pending Prescriptions Disp Refills   traZODone (DESYREL) 50 MG tablet [Pharmacy Med Name: TRAZODONE HYDROCHLORIDE 50 MG Tablet] 90 tablet 3    Sig: TAKE 1 TABLET AT BEDTIME AS NEEDED FOR SLEEP.     Psychiatry: Antidepressants - Serotonin Modulator Passed - 03/20/2023  4:10 AM      Passed - Valid encounter within last 6 months    Recent Outpatient Visits           1 week ago Hypertension associated with diabetes [facility])   [place] [facility] Larae Grooms, NP   5 months ago Encounter for annual wellness exam in Medicare patient   [place] [facility] Larae Grooms, NP   8 months ago Type II diabetes mellitus with neurological manifestations [facility])   [place] [facility] Mecum, Erin E, PA-C   12 months ago Hypertension associated with diabetes [facility])   [place] [facility] Larae Grooms, NP   1 year ago Essential hypertension   [place] [facility] Loura Pardon, MD       Future Appointments             In 6 months Larae Grooms, NP [place] [facility], [facility]

## 2023-04-07 DIAGNOSIS — R42 Dizziness and giddiness: Secondary | ICD-10-CM | POA: Diagnosis not present

## 2023-04-08 ENCOUNTER — Encounter: Payer: Self-pay | Admitting: Nurse Practitioner

## 2023-04-09 NOTE — Telephone Encounter (Signed)
Called and scheduled patient on 04/13/2023 @ 3:40 pm.

## 2023-04-13 ENCOUNTER — Ambulatory Visit (INDEPENDENT_AMBULATORY_CARE_PROVIDER_SITE_OTHER): Payer: Medicare HMO | Admitting: Physician Assistant

## 2023-04-13 VITALS — BP 122/70 | HR 69 | Temp 98.2°F | Ht 69.5 in | Wt 201.2 lb

## 2023-04-13 DIAGNOSIS — R42 Dizziness and giddiness: Secondary | ICD-10-CM

## 2023-04-13 DIAGNOSIS — E1149 Type 2 diabetes mellitus with other diabetic neurological complication: Secondary | ICD-10-CM | POA: Diagnosis not present

## 2023-04-13 NOTE — Patient Instructions (Addendum)
How to Use The Ozempic Pen  Ozempic (semaglutide) injection    Please go up to the 0.5 mg dose at your next scheduled injection day  Continue to use the 0.5 mg dose for the next 3 weeks

## 2023-04-13 NOTE — Progress Notes (Signed)
Acute Office Visit   Patient: Mark Allen   DOB: August 12, 1951   72 y.o. Male  MRN: [id number] Visit Date: 04/13/2023  Today's healthcare provider: [name] Melaysia Streed, PA-C  Introduced myself to the patient as a Secondary school teacher and provided education on APPs in clinical practice.    Chief Complaint  Patient presents with   Medication Management    Patient says he his morning time checks most of his readings have been around 200. Patient says his previous medication when he did his mornings checks would be around 100-150.    Constipation   Dizziness    Patient says he has been having some issues with Vertigo. Patient says he has seen ENT in the past and had adjustments recently. Patient says yesterday, he noticed a dizzy spell. Patient has an upcoming appointment in August.    Diabetes    Patient declines having a recent Diabetic Eye Exam.    Subjective    HPI HPI     Medication Management    Additional comments: Patient says he his morning time checks most of his readings have been around 200. Patient says his previous medication when he did his mornings checks would be around 100-150.         Dizziness    Additional comments: Patient says he has been having some issues with Vertigo. Patient says he has seen ENT in the past and had adjustments recently. Patient says yesterday, he noticed a dizzy spell. Patient has an upcoming appointment in August.         Diabetes    Additional comments: Patient declines having a recent Diabetic Eye Exam.       Last edited by Malen Gauze, CMA on 04/13/2023  3:36 PM.       Diabetes, Type 2 - Last A1c 7.4 - Medications: metformin 1000 mg PO BID, Jardiance 25 mg PO every day  - Compliance: excellent  - Checking BG at home: yes checking daily  Reports increase in his blood sugars since starting Ozempic  States typical fasting glucose levels are in 200s now  Added Ozempic last month  He has not increased to 0.5 mg dose yet  He is unsure  if he has been seeing the dose in the window after turning his pen clicks   - Denies symptoms of hypoglycemia, polyuria, polydipsia, numbness extremities, foot ulcers/trauma  Dizziness  Reports an episode of dizziness while he was laying down He has an apt with ENT in Aug  He has a history of vertigo and has had to get Hallpike/Epley maneuver completed several times in the past  He reports having tinnitus in both ears and does have hearing aides    Medications: Outpatient Medications Prior to Visit  Medication Sig   amLODipine (NORVASC) 5 MG tablet Take 1 tablet (5 mg total) by mouth daily.   benazepril (LOTENSIN) 20 MG tablet Take 1 tablet (20 mg total) by mouth daily.   Blood Glucose Calibration (TRUE METRIX LEVEL 3) High SOLN    Blood Glucose Monitoring Suppl (TRUE METRIX METER) w/Device KIT USE AS DIRECTED   cyanocobalamin 1000 MCG tablet Take 1,000 mcg by mouth daily.   DROPLET PEN NEEDLES 32G X 4 MM MISC USE 2 (TWO) TIMES DAILY AS NEEDED.   DULoxetine (CYMBALTA) 60 MG capsule Take 1 capsule (60 mg total) by mouth daily.   empagliflozin (JARDIANCE) 25 MG TABS tablet Take 1 tablet (25 mg total) by mouth daily.  fluticasone (FLONASE) 50 MCG/ACT nasal spray Place 2 sprays into both nostrils daily.   glucose blood (TRUE METRIX BLOOD GLUCOSE TEST) test strip TEST BLOOD SUGAR TWICE DAILY USE AS INSTRUCTED   metFORMIN (GLUCOPHAGE) 1000 MG tablet Take 1 tablet (1,000 mg total) by mouth 2 (two) times daily with a meal.   rosuvastatin (CRESTOR) 40 MG tablet Take 1 tablet (40 mg total) by mouth daily.   Semaglutide,0.25 or 0.5MG /DOS, (OZEMPIC, 0.25 OR 0.5 MG/DOSE,) 2 MG/1.5ML SOPN Inject 0.25 mg into the skin once a week. Start with 0.25MG  once a week x 4 weeks, then increase to 0.5MG  weekly.   traZODone (DESYREL) 50 MG tablet Take 1 tablet (50 mg total) by mouth at bedtime as needed for sleep.   TRUEplus Lancets 28G MISC 1 each by Other route in the morning and at bedtime.   insulin  degludec (TRESIBA FLEXTOUCH) 100 UNIT/ML FlexTouch Pen Inject 18 Units into the skin at bedtime. (Patient not taking: Reported on 04/13/2023)   No facility-administered medications prior to visit.    Review of Systems  Neurological:  Positive for dizziness.         Objective    BP 122/70   Pulse 69   Temp 98.2 F (36.8 C) (Oral)   Ht 5' 9.5" (1.765 m)   Wt 201 lb 3.2 oz (91.3 kg)   SpO2 95%   BMI 29.29 kg/m      Physical Exam Vitals reviewed.  Constitutional:      General: He is awake.     Appearance: Normal appearance. He is well-developed and well-groomed.  HENT:     Head: Normocephalic and atraumatic.     Right Ear: Hearing and ear canal normal.     Left Ear: Hearing and ear canal normal. A middle ear effusion is present.  Eyes:     General: Lids are normal. Gaze aligned appropriately.     Extraocular Movements: Extraocular movements intact.     Conjunctiva/sclera: Conjunctivae normal.     Pupils: Pupils are equal, round, and reactive to light.  Pulmonary:     Effort: Pulmonary effort is normal.  Neurological:     Mental Status: He is alert.  Psychiatric:        Behavior: Behavior is cooperative.       No results found for any visits on 04/13/23.  Assessment & Plan      Return in about 2 months (around 06/14/2023) for Diabetes follow up- discuss Ozempic .      Problem List Items Addressed This Visit       Endocrine   Type II diabetes mellitus with neurological manifestations (HCC) - Primary    Chronic, historic condition  Pt recently had Ozempic added to regimen and is reporting elevated glucose levels despite taking this We reviewed that he is on initial doses that are not likely sufficient to provide adequate management for him and this will need to be a gradual titration to avoid side effects. We reviewed medication administration and manufacturer's instructions were provided in AVS to assist with correct admin from here He is ready to go up to  the 0.5 mg /week dose at this time - recommend he proceed and continue to check glucose levels as he is doing now Continue other medications - will likely need to titrate to higher doses of Ozempic at follow up pending review of A1c Follow up in 2 months for regular monitoring       Other Visit Diagnoses  Dizziness     Acute, recurrent He reports issues with vertigo in the past and states he has apt with ENT in the next few months  Recommend keeping upcoming apt  May need referral to vestibular rehab if symptoms are not resolved after specialty visit Follow up as needed for persistent or progressing symptoms          Return in about 2 months (around 06/14/2023) for Diabetes follow up- discuss Ozempic .   I, Keelon Zurn E Ameliarose Shark, PA-C, have reviewed all documentation for this visit. The documentation on 04/16/23 for the exam, diagnosis, procedures, and orders are all accurate and complete.   Jacquelin Hawking, MHS, PA-C [facility]

## 2023-04-16 NOTE — Assessment & Plan Note (Signed)
Chronic, historic condition  Pt recently had Ozempic added to regimen and is reporting elevated glucose levels despite taking this We reviewed that he is on initial doses that are not likely sufficient to provide adequate management for him and this will need to be a gradual titration to avoid side effects. We reviewed medication administration and manufacturer's instructions were provided in AVS to assist with correct admin from here He is ready to go up to the 0.5 mg /week dose at this time - recommend he proceed and continue to check glucose levels as he is doing now Continue other medications - will likely need to titrate to higher doses of Ozempic at follow up pending review of A1c Follow up in 2 months for regular monitoring

## 2023-04-18 ENCOUNTER — Emergency Department (HOSPITAL_COMMUNITY): Payer: Medicare HMO

## 2023-04-18 ENCOUNTER — Encounter (HOSPITAL_COMMUNITY): Payer: Self-pay | Admitting: Emergency Medicine

## 2023-04-18 ENCOUNTER — Emergency Department (HOSPITAL_COMMUNITY)
Admission: EM | Admit: 2023-04-18 | Discharge: 2023-04-18 | Disposition: A | Discharge status: Home / Self Care | Payer: Medicare HMO | Attending: Emergency Medicine | Admitting: Emergency Medicine

## 2023-04-18 ENCOUNTER — Other Ambulatory Visit: Payer: Self-pay

## 2023-04-18 DIAGNOSIS — S02841A Fracture of lateral orbital wall, right side, initial encounter for closed fracture: Secondary | ICD-10-CM | POA: Diagnosis not present

## 2023-04-18 DIAGNOSIS — S0231XA Fracture of orbital floor, right side, initial encounter for closed fracture: Secondary | ICD-10-CM | POA: Diagnosis not present

## 2023-04-18 DIAGNOSIS — S80211A Abrasion, right knee, initial encounter: Secondary | ICD-10-CM | POA: Diagnosis not present

## 2023-04-18 DIAGNOSIS — S50811A Abrasion of right forearm, initial encounter: Secondary | ICD-10-CM | POA: Insufficient documentation

## 2023-04-18 DIAGNOSIS — S02401A Maxillary fracture, unspecified, initial encounter for closed fracture: Secondary | ICD-10-CM | POA: Diagnosis not present

## 2023-04-18 DIAGNOSIS — S0240CA Maxillary fracture, right side, initial encounter for closed fracture: Secondary | ICD-10-CM | POA: Diagnosis not present

## 2023-04-18 DIAGNOSIS — S0993XA Unspecified injury of face, initial encounter: Secondary | ICD-10-CM | POA: Diagnosis present

## 2023-04-18 DIAGNOSIS — Z041 Encounter for examination and observation following transport accident: Secondary | ICD-10-CM | POA: Diagnosis not present

## 2023-04-18 DIAGNOSIS — S0285XA Fracture of orbit, unspecified, initial encounter for closed fracture: Secondary | ICD-10-CM | POA: Diagnosis not present

## 2023-04-18 DIAGNOSIS — J439 Emphysema, unspecified: Secondary | ICD-10-CM | POA: Diagnosis not present

## 2023-04-18 DIAGNOSIS — I10 Essential (primary) hypertension: Secondary | ICD-10-CM | POA: Diagnosis not present

## 2023-04-18 DIAGNOSIS — S0081XA Abrasion of other part of head, initial encounter: Secondary | ICD-10-CM

## 2023-04-18 LAB — BASIC METABOLIC PANEL
Anion gap: 14 (ref 5–15)
BUN: 17 mg/dL (ref 8–23)
CO2: 22 mmol/L (ref 22–32)
Calcium: 9.5 mg/dL (ref 8.9–10.3)
Chloride: 105 mmol/L (ref 98–111)
Creatinine, Ser: 0.94 mg/dL (ref 0.61–1.24)
GFR, Estimated: >60 mL/min (ref >60)
Glucose, Bld: 183 mg/dL — ABNORMAL HIGH (ref 70–99)
Potassium: 3.2 mmol/L — ABNORMAL LOW (ref 3.5–5.1)
Sodium: 141 mmol/L (ref 135–145)

## 2023-04-18 LAB — CBC
HCT: 46.6 % (ref 39.0–52.0)
Hemoglobin: 15.5 g/dL (ref 13.0–17.0)
MCH: 31.1 pg (ref 26.0–34.0)
MCHC: 33.3 g/dL (ref 30.0–36.0)
MCV: 93.4 fL (ref 80.0–100.0)
Platelets: 340 10*3/uL (ref 150–400)
RBC: 4.99 MIL/uL (ref 4.22–5.81)
RDW: 12.4 % (ref 11.5–15.5)
WBC: 13.8 10*3/uL — ABNORMAL HIGH (ref 4.0–10.5)
nRBC: 0 % (ref 0.0–0.2)

## 2023-04-18 MED ORDER — AMOXICILLIN-POT CLAVULANATE 875-125 MG PO TABS: 1.0000 | ORAL_TABLET | Freq: Two times a day (BID) | ORAL | 0 refills | Status: AC

## 2023-04-18 MED ORDER — OXYCODONE-ACETAMINOPHEN 5-325 MG PO TABS: 1.0000 | ORAL_TABLET | Freq: Four times a day (QID) | ORAL | 0 refills | Status: DC | PRN

## 2023-04-18 MED ORDER — ONDANSETRON HCL 4 MG/2ML IJ SOLN
INTRAMUSCULAR | Status: AC
  Administered 2023-04-18: 4 mg
  Filled 2023-04-18: qty 2

## 2023-04-18 MED ORDER — KETOROLAC TROMETHAMINE 30 MG/ML IJ SOLN
30.0000 mg | Freq: Once | INTRAMUSCULAR | Status: AC
  Administered 2023-04-18: 30 mg via INTRAVENOUS
  Filled 2023-04-18: qty 1

## 2023-04-18 MED ORDER — MORPHINE SULFATE (PF) 4 MG/ML IV SOLN
4.0000 mg | Freq: Once | INTRAVENOUS | Status: AC
  Administered 2023-04-18: 4 mg via INTRAVENOUS
  Filled 2023-04-18: qty 1
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
## 2023-04-18 NOTE — ED Notes (Signed)
Patient was able to stand an put his clothes on with little assistance. Patient ambulated to the wheelchair. Denies dizziness. Understands discharge instructions.

## 2023-04-20 ENCOUNTER — Encounter: Payer: Self-pay | Admitting: Plastic Surgery

## 2023-04-20 ENCOUNTER — Ambulatory Visit: Payer: Medicare HMO | Admitting: Plastic Surgery

## 2023-04-20 ENCOUNTER — Telehealth: Payer: Self-pay | Admitting: *Deleted

## 2023-04-20 DIAGNOSIS — S02841A Fracture of lateral orbital wall, right side, initial encounter for closed fracture: Secondary | ICD-10-CM

## 2023-04-20 DIAGNOSIS — S02401A Maxillary fracture, unspecified, initial encounter for closed fracture: Secondary | ICD-10-CM

## 2023-04-20 DIAGNOSIS — S0292XA Unspecified fracture of facial bones, initial encounter for closed fracture: Secondary | ICD-10-CM
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
## 2023-04-20 NOTE — Telephone Encounter (Signed)
Transition Care Management Follow-up Telephone Call Date of discharge and from where: [facility] 04/18/2023 How have you been since you were released from the hospital? Much better  Any questions or concerns? No  Items Reviewed: Did the pt receive and understand the discharge instructions provided? No  Medications obtained and verified? No  Other? No  Any new allergies since your discharge? No  Dietary orders reviewed? No Do you have support at home?Yes       Follow up appointments reviewed:  [facility] f/u appt confirmed? Yes  Scheduled to see ENT today  Are transportation arrangements needed? No  If their condition worsens, is the pt aware to call PCP or go to the Emergency Dept.? Yes Was the patient provided with contact information for the PCP's office or ED? Yes Was to pt encouraged to call back with questions or concerns? Yes

## 2023-04-23 DIAGNOSIS — S80211A Abrasion, right knee, initial encounter: Secondary | ICD-10-CM | POA: Diagnosis not present

## 2023-04-23 DIAGNOSIS — M25461 Effusion, right knee: Secondary | ICD-10-CM | POA: Diagnosis not present

## 2023-04-23 DIAGNOSIS — M25561 Pain in right knee: Secondary | ICD-10-CM | POA: Diagnosis not present

## 2023-04-29 DIAGNOSIS — M25561 Pain in right knee: Secondary | ICD-10-CM | POA: Diagnosis not present

## 2023-04-30 ENCOUNTER — Ambulatory Visit: Payer: Medicare HMO | Admitting: Plastic Surgery

## 2023-04-30 DIAGNOSIS — S0230XA Fracture of orbital floor, unspecified side, initial encounter for closed fracture: Secondary | ICD-10-CM | POA: Diagnosis not present

## 2023-04-30 DIAGNOSIS — S0292XA Unspecified fracture of facial bones, initial encounter for closed fracture: Secondary | ICD-10-CM

## 2023-04-30 NOTE — Progress Notes (Signed)
Mark Allen returns today for evaluation of a small nondisplaced orbital floor fracture.  He reports doing well with no diplopia no difficulty with vision and his headaches have resolved.  On physical exam there is no periorbital swelling and all extraocular movements are normal and intact.  He will slowly increase activity as tolerated and he may follow-up with me as needed.

## 2023-05-01 DIAGNOSIS — S83411A Sprain of medial collateral ligament of right knee, initial encounter: Secondary | ICD-10-CM | POA: Diagnosis not present

## 2023-05-01 DIAGNOSIS — M1711 Unilateral primary osteoarthritis, right knee: Secondary | ICD-10-CM | POA: Diagnosis not present

## 2023-05-01 DIAGNOSIS — S83281A Other tear of lateral meniscus, current injury, right knee, initial encounter: Secondary | ICD-10-CM | POA: Diagnosis not present

## 2023-05-06 DIAGNOSIS — R42 Dizziness and giddiness: Secondary | ICD-10-CM | POA: Diagnosis not present

## 2023-05-25 DIAGNOSIS — M1711 Unilateral primary osteoarthritis, right knee: Secondary | ICD-10-CM | POA: Diagnosis not present

## 2023-06-04 DIAGNOSIS — M1711 Unilateral primary osteoarthritis, right knee: Secondary | ICD-10-CM | POA: Diagnosis not present

## 2023-06-08 DIAGNOSIS — M1711 Unilateral primary osteoarthritis, right knee: Secondary | ICD-10-CM | POA: Diagnosis not present

## 2023-06-11 DIAGNOSIS — H9311 Tinnitus, right ear: Secondary | ICD-10-CM | POA: Diagnosis not present

## 2023-06-11 DIAGNOSIS — H9193 Unspecified hearing loss, bilateral: Secondary | ICD-10-CM | POA: Diagnosis not present

## 2023-06-11 DIAGNOSIS — R42 Dizziness and giddiness: Secondary | ICD-10-CM | POA: Diagnosis not present

## 2023-06-12 DIAGNOSIS — M1711 Unilateral primary osteoarthritis, right knee: Secondary | ICD-10-CM | POA: Diagnosis not present

## 2023-06-15 ENCOUNTER — Encounter: Payer: Self-pay | Admitting: Physician Assistant

## 2023-06-15 ENCOUNTER — Ambulatory Visit (INDEPENDENT_AMBULATORY_CARE_PROVIDER_SITE_OTHER): Payer: Medicare HMO | Admitting: Physician Assistant

## 2023-06-15 VITALS — BP 125/68 | HR 75 | Ht 69.5 in | Wt 204.4 lb

## 2023-06-15 DIAGNOSIS — E1149 Type 2 diabetes mellitus with other diabetic neurological complication: Secondary | ICD-10-CM | POA: Diagnosis not present

## 2023-06-15 DIAGNOSIS — Z7985 Long-term (current) use of injectable non-insulin antidiabetic drugs: Secondary | ICD-10-CM

## 2023-06-15 MED ORDER — EMPAGLIFLOZIN 25 MG PO TABS
25.0000 mg | ORAL_TABLET | Freq: Every day | ORAL | 1 refills | Status: DC
Start: 2023-06-15 — End: 2023-09-14

## 2023-06-15 MED ORDER — OZEMPIC (0.25 OR 0.5 MG/DOSE) 2 MG/1.5ML ~~LOC~~ SOPN: 0.5000 mg | PEN_INJECTOR | SUBCUTANEOUS | Status: DC

## 2023-06-15 NOTE — Progress Notes (Signed)
Established Patient Office Visit  Name: Mark Allen   MRN: [id number]    DOB: [birth year]   Date:06/15/2023  Today's Provider: Jacquelin Hawking, MHS, PA-C Introduced myself to the patient as a PA-C and provided education on APPs in clinical practice.         Subjective  Chief Complaint  Chief Complaint  Patient presents with   Diabetes    Patient decline having a recent Diabetic Eye Exam at today's visit.    Medication Refill    Patient would like a new prescription for Jardiance to [facility] in [place].     HPI  Diabetes, Type 2 - Last A1c 7.4 - Medications: Ozempic 0.5 mg weekly injection, Metformin 1000 mg PO BID, Jardiance 25 mgPO every day,  - Compliance: has been off Jardiance for about a month due to running out  - Checking BG at home: yes usually around 150s  He denies hypoglycemic events or dizziness, palpitations, diaphoresis, confusion, polydipsia, polyuria     Patient Active Problem List   Diagnosis Date Noted   Advanced care planning/counseling discussion 10/09/2022   Essential hypertension 10/09/2021   Pain due to onychomycosis of toenails of both feet 10/03/2021   Peripheral neuropathy 07/01/2021   Tinnitus 07/01/2021   Diabetes mellitus (HCC) 06/18/2021   Vitamin B 12 deficiency 02/28/2021   ED (erectile dysfunction) 09/19/2018   Insomnia 05/29/2015   Type II diabetes mellitus with neurological manifestations (HCC)    Hypertension associated with diabetes (HCC)    Hyperlipidemia    History of nonmelanoma skin cancer 03/03/2013    Past Surgical History:  Procedure Laterality Date   COLONOSCOPY WITH PROPOFOL N/A 10/22/2018   Procedure: COLONOSCOPY WITH PROPOFOL;  Surgeon: Wyline Mood, MD;  Location: [facility] ENDOSCOPY;  Service: Gastroenterology;  Laterality: N/A;   NASAL SINUS SURGERY  2004   REFRACTIVE SURGERY Bilateral    SPLENECTOMY  1963   TONSILLECTOMY      Family History  Problem Relation Age of Onset   Cancer Father     Social  History   Tobacco Use   Smoking status: Former    Current packs/day: 0.00    Types: Cigarettes    Quit date: 01/27/1989    Years since quitting: 34.4   Smokeless tobacco: Former    Types: Chew    Quit date: 09/29/1990  Substance Use Topics   Alcohol use: Yes    Alcohol/week: 0.0 standard drinks of alcohol    Comment: rare,none last 24hrs     Current Outpatient Medications:    amLODipine (NORVASC) 5 MG tablet, Take 1 tablet (5 mg total) by mouth daily., Disp: 90 tablet, Rfl: 1   benazepril (LOTENSIN) 20 MG tablet, Take 1 tablet (20 mg total) by mouth daily., Disp: 90 tablet, Rfl: 1   Blood Glucose Calibration (TRUE METRIX LEVEL 3) High SOLN, , Disp: , Rfl:    Blood Glucose Monitoring Suppl (TRUE METRIX METER) w/Device KIT, USE AS DIRECTED, Disp: 1 kit, Rfl: 1   cyanocobalamin 1000 MCG tablet, Take 1,000 mcg by mouth daily., Disp: , Rfl:    DULoxetine (CYMBALTA) 60 MG capsule, Take 1 capsule (60 mg total) by mouth daily., Disp: 90 capsule, Rfl: 1   fluticasone (FLONASE) 50 MCG/ACT nasal spray, Place 2 sprays into both nostrils daily., Disp: , Rfl:    glucose blood (TRUE METRIX BLOOD GLUCOSE TEST) test strip, TEST BLOOD SUGAR TWICE DAILY USE AS INSTRUCTED, Disp: 200 strip, Rfl: 1  metFORMIN (GLUCOPHAGE) 1000 MG tablet, Take 1 tablet (1,000 mg total) by mouth 2 (two) times daily with a meal., Disp: 180 tablet, Rfl: 1   rosuvastatin (CRESTOR) 40 MG tablet, Take 1 tablet (40 mg total) by mouth daily., Disp: 90 tablet, Rfl: 1   traZODone (DESYREL) 50 MG tablet, Take 1 tablet (50 mg total) by mouth at bedtime as needed for sleep., Disp: 90 tablet, Rfl: 1   TRUEplus Lancets 28G MISC, 1 each by Other route in the morning and at bedtime., Disp: 210 each, Rfl: 11   empagliflozin (JARDIANCE) 25 MG TABS tablet, Take 1 tablet (25 mg total) by mouth daily., Disp: 90 tablet, Rfl: 1   Semaglutide,0.25 or 0.5MG /DOS, (OZEMPIC, 0.25 OR 0.5 MG/DOSE,) 2 MG/1.5ML SOPN, Inject 0.5 mg into the skin once a week.  Start with 0.25MG  once a week x 4 weeks, then increase to 0.5MG  weekly., Disp: , Rfl:   Allergies  Allergen Reactions   Sulfa Antibiotics    Sulfacetamide Sodium     I personally reviewed active problem list, medication list, health maintenance, notes from last encounter, lab results with the patient/caregiver today.   Review of Systems  Respiratory:  Negative for shortness of breath and wheezing.   Cardiovascular:  Negative for chest pain and palpitations.  Gastrointestinal:  Negative for constipation, diarrhea, nausea and vomiting.  Genitourinary:  Negative for dysuria and frequency.  Neurological:  Negative for dizziness and headaches.  Endo/Heme/Allergies:  Negative for polydipsia.      Objective  Vitals:   06/15/23 1459  BP: 125/68  Pulse: 75  SpO2: 95%  Weight: 204 lb 6.4 oz (92.7 kg)  Height: 5' 9.5" (1.765 m)    Body mass index is 29.75 kg/m.  Physical Exam Vitals reviewed.  Constitutional:      General: He is awake.     Appearance: Normal appearance. He is well-developed and well-groomed.  HENT:     Head: Normocephalic and atraumatic.  Eyes:     Extraocular Movements: Extraocular movements intact.     Conjunctiva/sclera: Conjunctivae normal.     Pupils: Pupils are equal, round, and reactive to light.  Pulmonary:     Effort: Pulmonary effort is normal.  Musculoskeletal:     Cervical back: Normal range of motion.  Skin:    General: Skin is warm and dry.  Neurological:     General: No focal deficit present.     Mental Status: He is alert and oriented to person, place, and time. Mental status is at baseline.  Psychiatric:        Mood and Affect: Mood normal.        Behavior: Behavior normal. Behavior is cooperative.        Thought Content: Thought content normal.        Judgment: Judgment normal.      Recent Results (from the past 2160 hour(s))  Basic metabolic panel     Status: Abnormal   Collection Time: 04/18/23  4:02 PM  Result Value Ref  Range   Sodium 141 135 - 145 mmol/L   Potassium 3.2 (L) 3.5 - 5.1 mmol/L   Chloride 105 98 - 111 mmol/L   CO2 22 22 - 32 mmol/L   Glucose, Bld 183 (H) 70 - 99 mg/dL    Comment: Glucose reference range applies only to samples taken after fasting for at least 8 hours.   BUN 17 8 - 23 mg/dL   Creatinine, Ser 1.61 0.61 - 1.24 mg/dL   Calcium 9.5 8.9 -  10.3 mg/dL   GFR, Estimated >84 >69 mL/min    Comment: (NOTE) Calculated using the CKD-EPI Creatinine Equation (2021)    Anion gap 14 5 - 15    Comment: Performed at [facility], [address], [place], [place] [address]  CBC     Status: Abnormal   Collection Time: 04/18/23  4:02 PM  Result Value Ref Range   WBC 13.8 (H) 4.0 - 10.5 K/uL   RBC 4.99 4.22 - 5.81 MIL/uL   Hemoglobin 15.5 13.0 - 17.0 g/dL   HCT 84.1 32.4 - 40.1 %   MCV 93.4 80.0 - 100.0 fL   MCH 31.1 26.0 - 34.0 pg   MCHC 33.3 30.0 - 36.0 g/dL   RDW 02.7 25.3 - 66.4 %   Platelets 340 150 - 400 K/uL   nRBC 0.0 0.0 - 0.2 %    Comment: Performed at [facility], [address], [place], [place] [address]     PHQ2/9:    06/15/2023    3:04 PM 04/13/2023    3:38 PM 03/10/2023    8:43 AM 11/04/2022    8:22 AM 10/09/2022   10:24 AM  Depression screen PHQ 2/9  Decreased Interest 0 0 0 0 0  Down, Depressed, Hopeless 0 0 0 0 0  PHQ - 2 Score 0 0 0 0 0  Altered sleeping 0 0 0 0 0  Tired, decreased energy 1 0 1 0 0  Change in appetite 0 0 0 0 0  Feeling bad or failure about yourself  0 0 0 0 0  Trouble concentrating 0 0 0 0 0  Moving slowly or fidgety/restless 0 0 0 0 0  Suicidal thoughts 0 0 0 0 0  PHQ-9 Score 1 0 1 0 0  Difficult doing work/chores Not difficult at all Not difficult at all Not difficult at all Not difficult at all Not difficult at all      Fall Risk:    04/13/2023    3:38 PM 03/10/2023    8:43 AM 11/04/2022    8:24 AM 10/31/2022   10:14 AM 10/09/2022   10:24 AM  Fall Risk   Falls in the past year? 0 0 0 0 0  Number falls in past yr: 0 0 0  0 0  Injury with Fall? 0 0 0 0 0  Risk for fall due to : No Fall Risks No Fall Risks No Fall Risks  No Fall Risks  Follow up Falls evaluation completed Falls evaluation completed Falls prevention discussed;Falls evaluation completed  Falls evaluation completed      Functional Status Survey:      Assessment & Plan  Problem List Items Addressed This Visit       Endocrine   Type II diabetes mellitus with neurological manifestations (HCC) - Primary    Chronic, historic condition  Most recent A1c was 7.4- recheck today for monitoring, Results to dictate further management  Currently taking Ozempic 0.5 mg weekly injection, Metformin 1000 mg PO BID, Jardiance 25 mgPO every day (has been out of [place] for about a month but would like refill sent today) Jardiance refills sent per request  Follow up in 3 months or sooner if concerns arise        Relevant Medications   empagliflozin (JARDIANCE) 25 MG TABS tablet   Semaglutide,0.25 or 0.5MG /DOS, (OZEMPIC, 0.25 OR 0.5 MG/DOSE,) 2 MG/1.5ML SOPN   Other Relevant Orders   HgB A1c     Return  in about 3 months (around 09/14/2023) for DM, HTN, HLD.   I, Kahner Yanik E Kristjan Derner, PA-C, have reviewed all documentation for this visit. The documentation on 06/15/23 for the exam, diagnosis, procedures, and orders are all accurate and complete.   Jacquelin Hawking, MHS, PA-C [facility]

## 2023-06-15 NOTE — Assessment & Plan Note (Addendum)
Chronic, historic condition  Most recent A1c was 7.4- recheck today for monitoring, Results to dictate further management  Currently taking Ozempic 0.5 mg weekly injection, Metformin 1000 mg PO BID, Jardiance 25 mgPO every day (has been out of Jaridance for about a month but would like refill sent today) Jardiance refills sent per request  Follow up in 3 months or sooner if concerns arise

## 2023-06-16 DIAGNOSIS — M1711 Unilateral primary osteoarthritis, right knee: Secondary | ICD-10-CM | POA: Diagnosis not present

## 2023-06-16 LAB — HEMOGLOBIN A1C
Est. average glucose Bld gHb Est-mCnc: 180 mg/dL
Hgb A1c MFr Bld: 7.9 % — ABNORMAL HIGH (ref 4.8–5.6)

## 2023-06-17 NOTE — Progress Notes (Signed)
Your A1c was 7.9 which is increased from your previous results This should improve once we get you restarted on the Jardiance but we may need to make other changes if your A1c stays above 7.0  For now please continue to take your current medications and watch your sugar intake.

## 2023-06-23 DIAGNOSIS — M1711 Unilateral primary osteoarthritis, right knee: Secondary | ICD-10-CM | POA: Diagnosis not present

## 2023-06-24 DIAGNOSIS — Z85828 Personal history of other malignant neoplasm of skin: Secondary | ICD-10-CM | POA: Diagnosis not present

## 2023-06-24 DIAGNOSIS — D692 Other nonthrombocytopenic purpura: Secondary | ICD-10-CM | POA: Diagnosis not present

## 2023-06-24 DIAGNOSIS — L578 Other skin changes due to chronic exposure to nonionizing radiation: Secondary | ICD-10-CM | POA: Diagnosis not present

## 2023-06-24 DIAGNOSIS — S81801A Unspecified open wound, right lower leg, initial encounter: Secondary | ICD-10-CM | POA: Diagnosis not present

## 2023-06-24 DIAGNOSIS — C44612 Basal cell carcinoma of skin of right upper limb, including shoulder: Secondary | ICD-10-CM | POA: Diagnosis not present

## 2023-06-24 DIAGNOSIS — C44319 Basal cell carcinoma of skin of other parts of face: Secondary | ICD-10-CM | POA: Diagnosis not present

## 2023-06-24 DIAGNOSIS — D229 Melanocytic nevi, unspecified: Secondary | ICD-10-CM | POA: Diagnosis not present

## 2023-06-24 DIAGNOSIS — D489 Neoplasm of uncertain behavior, unspecified: Secondary | ICD-10-CM | POA: Diagnosis not present

## 2023-06-24 DIAGNOSIS — L821 Other seborrheic keratosis: Secondary | ICD-10-CM | POA: Diagnosis not present

## 2023-06-24 DIAGNOSIS — L814 Other melanin hyperpigmentation: Secondary | ICD-10-CM | POA: Diagnosis not present

## 2023-07-06 DIAGNOSIS — E119 Type 2 diabetes mellitus without complications: Secondary | ICD-10-CM | POA: Diagnosis not present

## 2023-07-06 DIAGNOSIS — H43393 Other vitreous opacities, bilateral: Secondary | ICD-10-CM | POA: Diagnosis not present

## 2023-07-06 DIAGNOSIS — Z961 Presence of intraocular lens: Secondary | ICD-10-CM | POA: Diagnosis not present

## 2023-07-06 DIAGNOSIS — H52223 Regular astigmatism, bilateral: Secondary | ICD-10-CM | POA: Diagnosis not present

## 2023-07-06 LAB — HM DIABETES EYE EXAM

## 2023-07-09 ENCOUNTER — Encounter: Payer: Self-pay | Admitting: Nurse Practitioner

## 2023-07-09 DIAGNOSIS — M1711 Unilateral primary osteoarthritis, right knee: Secondary | ICD-10-CM | POA: Diagnosis not present

## 2023-07-28 ENCOUNTER — Other Ambulatory Visit: Payer: Self-pay | Admitting: Nurse Practitioner

## 2023-07-28 NOTE — Telephone Encounter (Signed)
Requested medication (s) are due for refill today:   Requested medication (s) are on the active medication list: Yes  Last refill:  03/10/23  Future visit scheduled: Yes  Notes to clinic:  See request.    Requested Prescriptions  Pending Prescriptions Disp Refills   OZEMPIC, 0.25 OR 0.5 MG/DOSE, 2 MG/3ML SOPN [Pharmacy Med Name: OZEMPIC 0.25-0.5 MG/DOSE PEN]  2    Sig: INJECT 0.25 MG INTO THE SKIN ONCE A WEEK. START WITH 0.25MG  ONCE A WEEK X 4 WEEKS, THEN INCREASE TO 0.5MG  WEEKLY.     Endocrinology:  Diabetes - GLP-1 Receptor Agonists - semaglutide Failed - 07/28/2023  1:23 AM      Failed - HBA1C in normal range and within 180 days    Hemoglobin A1C  Date Value Ref Range Status  05/22/2016 7.6  Final   HB A1C (BAYER DCA - WAIVED)  Date Value Ref Range Status  07/01/2022 7.2 (H) 4.8 - 5.6 % Final    Comment:             Prediabetes: 5.7 - 6.4          Diabetes: >6.4          Glycemic control for adults with diabetes: <7.0    Hgb A1c MFr Bld  Date Value Ref Range Status  06/15/2023 7.9 (H) 4.8 - 5.6 % Final    Comment:             Prediabetes: 5.7 - 6.4          Diabetes: >6.4          Glycemic control for adults with diabetes: <7.0          Passed - Cr in normal range and within 360 days    Creatinine, Ser  Date Value Ref Range Status  04/18/2023 0.94 0.61 - 1.24 mg/dL Final         Passed - Valid encounter within last 6 months    Recent Outpatient Visits           1 month ago Type II diabetes mellitus with neurological manifestations (HCC)   [place] [facility] Mecum, Erin E, PA-C   3 months ago Type II diabetes mellitus with neurological manifestations (HCC)   [place] [facility] Mecum, Erin E, PA-C   4 months ago Hypertension associated with diabetes [facility])   [place] [facility] Larae Grooms, NP   9 months ago Encounter for annual wellness exam in Medicare patient   [facility]  Larae Grooms, NP   1 year ago Type II diabetes mellitus with neurological manifestations [facility])   [place] [facility] Mecum, [name], PA-C       Future Appointments             In 1 month Larae Grooms, NP [facility]   In 2 months Larae Grooms, NP  [facility], [facility]

## 2023-08-03 ENCOUNTER — Other Ambulatory Visit: Payer: Self-pay | Admitting: Nurse Practitioner

## 2023-08-03 DIAGNOSIS — E785 Hyperlipidemia, unspecified: Secondary | ICD-10-CM

## 2023-08-03 DIAGNOSIS — I10 Essential (primary) hypertension: Secondary | ICD-10-CM

## 2023-08-03 DIAGNOSIS — G4700 Insomnia, unspecified: Secondary | ICD-10-CM

## 2023-08-04 IMAGING — MR MR BRAIN/TEMPORAL BONE/IAC
12 of 13 series · 45 of 48 positions shown · IV contrast (multihance)
Comparison: None.

CLINICAL DATA: Sensorineural hearing loss, unilateral, left ear,
with restricted hearing on the contralateral side
([id number]).

EXAM:
MRI HEAD WITHOUT AND WITH CONTRAST
TECHNIQUE: Multiplanar, multiecho pulse sequences of the brain and surrounding
structures were obtained without and with intravenous contrast.
CONTRAST:  18mL MULTIHANCE GADOBENATE DIMEGLUMINE 529 MG/ML IV SOLN

[Series 5: T1 · sagittal · 4.0mm · 0.72mm/px · 2 of 30 slices shown (1 of 3)]
[im 1/30]
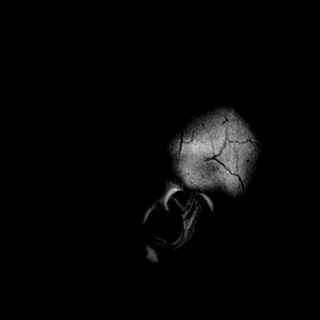
[im 30/30]
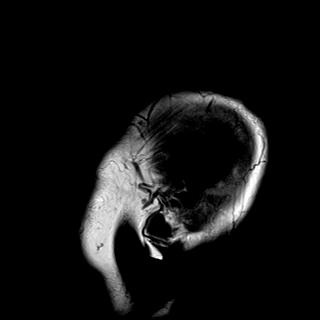

[Series 6: DWI · axial · 3.0mm · 0.94mm/px · z∈[-67,+95]mm · 12 of 184 slices shown]
[im 1/184]
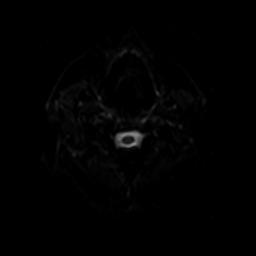
[im 17/184]
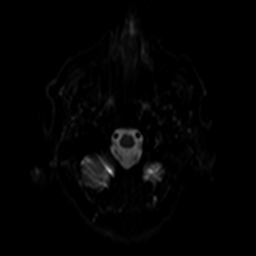
[im 34/184]
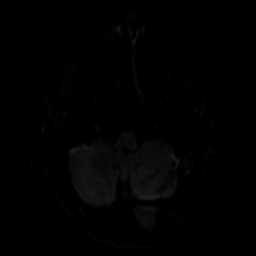
[im 50/184]
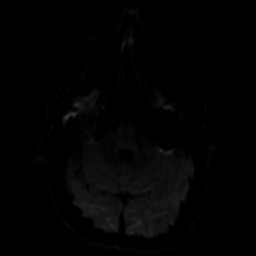
[im 67/184]
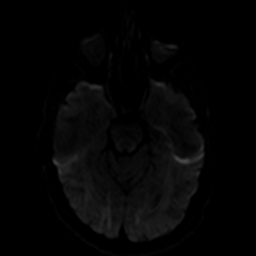
[im 84/184]
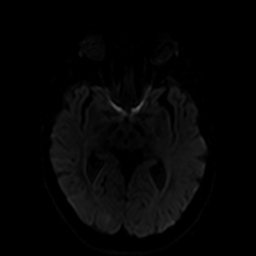
[im 100/184]
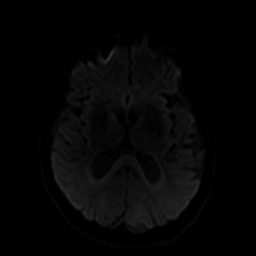
[im 117/184]
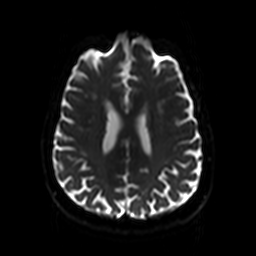
[im 134/184]
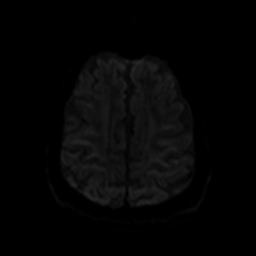
[im 150/184]
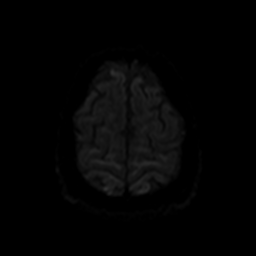
[im 167/184]
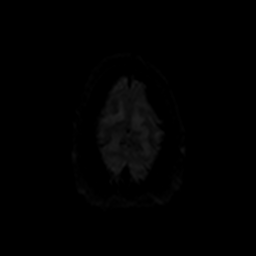
[im 184/184]
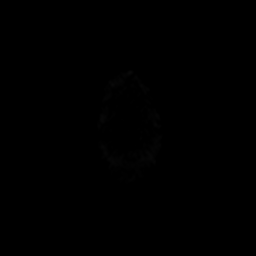

[Series 7: ax dwi_tracew · axial · 3.0mm · 0.94mm/px · z∈[-67,+95]mm · 6 of 92 slices shown]
[im 1/92]
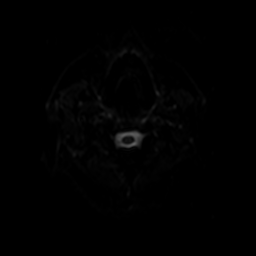
[im 19/92]
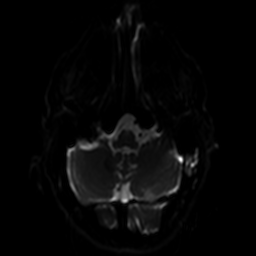
[im 37/92]
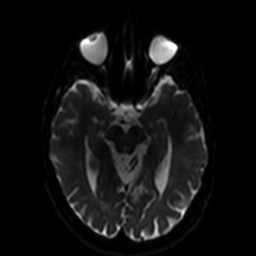
[im 55/92]
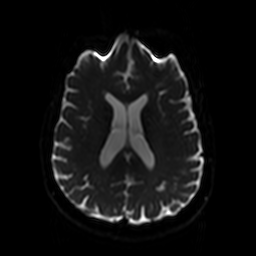
[im 73/92]
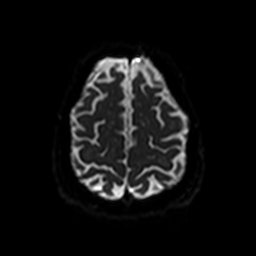
[im 92/92]
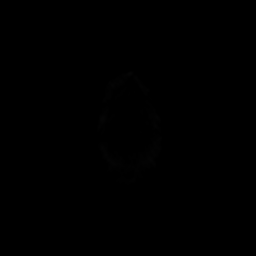

[Series 8: ax dwi_adc · axial · 3.0mm · 0.94mm/px · z∈[-67,+95]mm · 3 of 46 slices shown]
[im 1/46]
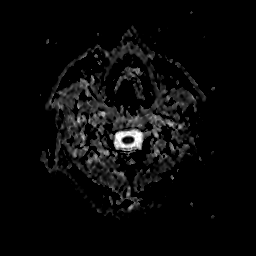
[im 23/46]
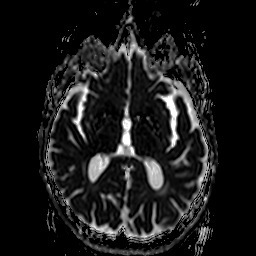
[im 46/46]
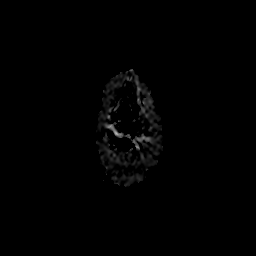

[Series 9: T2 · axial · 4.0mm · 0.36mm/px · z∈[-71,+95]mm · 2 of 33 slices shown]
[im 1/33]
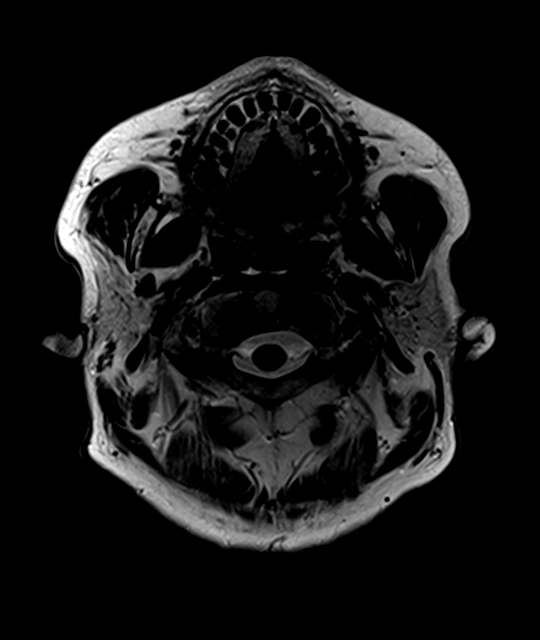
[im 33/33]
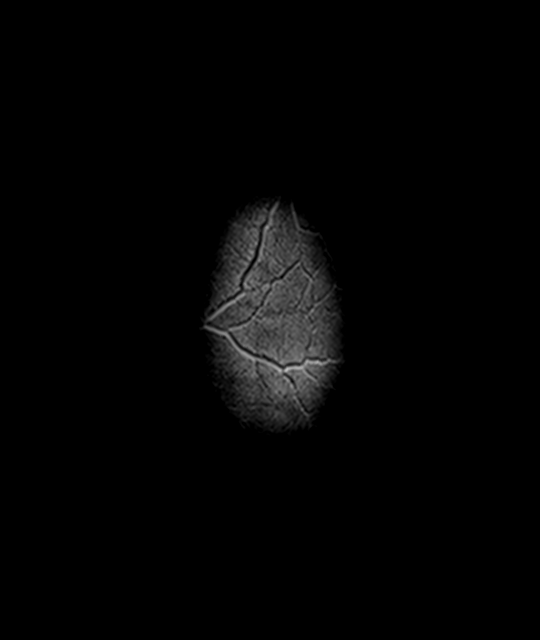

[Series 10: FLAIR · axial · 3.0mm · 0.72mm/px · z∈[-68,+93]mm · 2 of 28 slices shown]
[im 1/28]
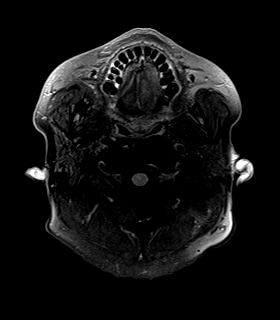
[im 28/28]
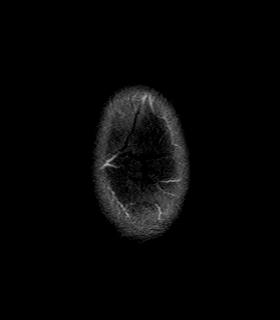

[Series 12: swi_images · axial · 3.0mm · 0.90mm/px · z∈[-65,+88]mm · 3 of 52 slices shown]
[im 1/52]
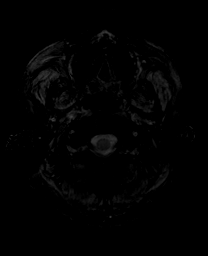
[im 26/52]
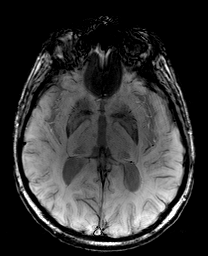
[im 52/52]
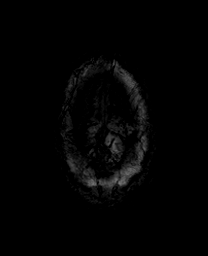

[Series 13: T1 · coronal · 3.0mm · 0.56mm/px · 1 of 13 slices shown (2 of 3)]
[im 1/13]
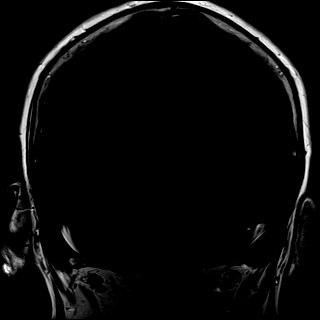

[Series 15: T1 · axial · 3.0mm · 0.50mm/px · 1 of 13 slices shown (3 of 3)]
[im 1/13]
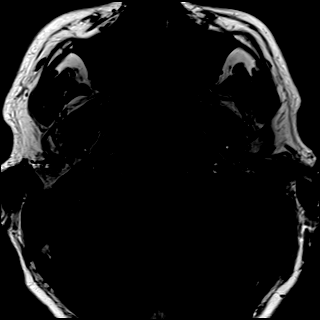

[Series 16: T1 post-contrast · coronal · 3.0mm · 0.56mm/px · 1 of 13 slices shown (1 of 3)]
[im 1/13]
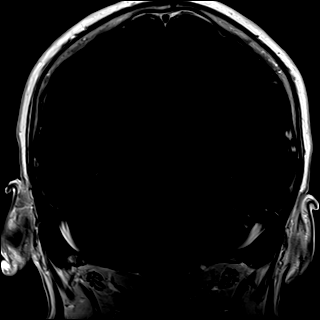

[Series 17: T1 post-contrast · axial · 3.0mm · 0.50mm/px · 1 of 13 slices shown (2 of 3)]
[im 1/13]
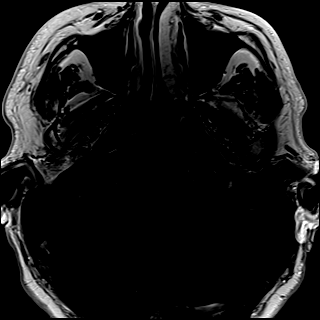

[Series 18: T1 post-contrast · axial · 1.0mm · 0.90mm/px · z∈[-67,+91]mm · 11 of 160 slices shown (3 of 3)]
[im 1/160]
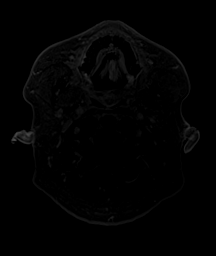
[im 16/160]
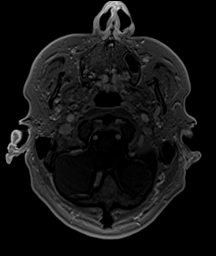
[im 32/160]
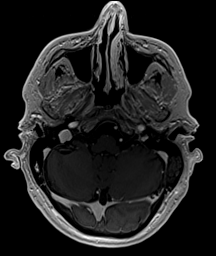
[im 48/160]
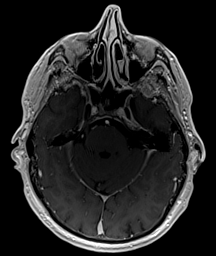
[im 64/160]
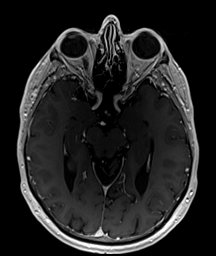
[im 80/160]
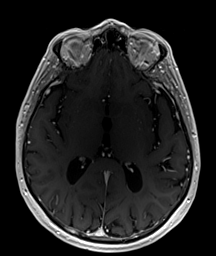
[im 96/160]
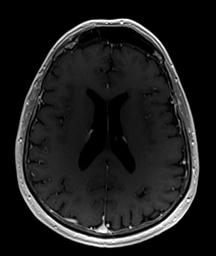
[im 112/160]
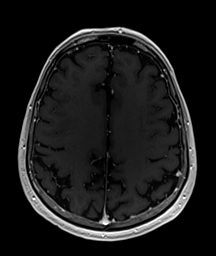
[im 128/160]
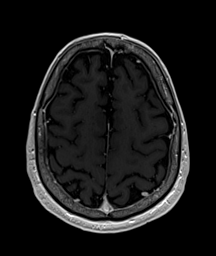
[im 144/160]
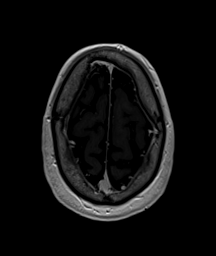
[im 160/160]
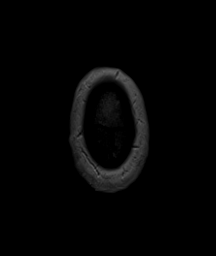

[45 of 48 positions shown; findings below may reference images not displayed]

FINDINGS: Brain: No acute infarction, hemorrhage, hydrocephalus, extra-axial
collection or mass lesion. Scattered foci of T2 hyperintensity are
seen within the white matter of the cerebral hemispheres,
nonspecific, likely related to chronic small vessel ischemia. Mild
parenchymal volume loss.

No cerebellopontine angle mass or internal auditory canal lesion is
demonstrated.Normal appearance of the 7th and 8th cranial nerves
bilaterally. No focus of abnormal contrast enhancement.

Enlarged, partially empty sella.

Vascular: Normal flow voids.High riding right jugular bulb
incidentally noted.

Skull and upper cervical spine: Normal marrow signal.

Sinuses/Orbits: Left lens surgery. Paranasal sinuses are clear.
Bilateral mastoid effusion, left greater than right.

Other: None.
IMPRESSION: 1. No cerebellopontine angle or internal auditory canal mass lesion
or abnormal contrast enhancement.
2. Mild chronic microvascular ischemic changes of the white matter
and mild parenchymal volume loss.
3. Left mastoid effusion.

## 2023-08-04 NOTE — Telephone Encounter (Signed)
Requested Prescriptions  Pending Prescriptions Disp Refills   rosuvastatin (CRESTOR) 40 MG tablet [Pharmacy Med Name: Rosuvastatin Calcium Oral Tablet 40 MG] 90 tablet 1    Sig: TAKE 1 TABLET EVERY DAY     Cardiovascular:  Antilipid - Statins 2 Failed - 08/03/2023  2:59 AM      Failed - Lipid Panel in normal range within the last 12 months    Cholesterol, Total  Date Value Ref Range Status  03/10/2023 138 100 - 199 mg/dL Final   Cholesterol Piccolo, Waived  Date Value Ref Range Status  03/23/2017 145 <200 mg/dL Final    Comment:                            Desirable                <200                         Borderline High      200- 239                         High                     >239    LDL Chol Calc (NIH)  Date Value Ref Range Status  03/10/2023 64 0 - 99 mg/dL Final   HDL  Date Value Ref Range Status  03/10/2023 50 >39 mg/dL Final   Triglycerides  Date Value Ref Range Status  03/10/2023 139 0 - 149 mg/dL Final   Triglycerides Piccolo,Waived  Date Value Ref Range Status  03/23/2017 74 <150 mg/dL Final    Comment:                            Normal                   <150                         Borderline High     150 - 199                         High                200 - 499                         Very High                >499          Passed - Cr in normal range and within 360 days    Creatinine, Ser  Date Value Ref Range Status  04/18/2023 0.94 0.61 - 1.24 mg/dL Final         Passed - Patient is not pregnant      Passed - Valid encounter within last 12 months    Recent Outpatient Visits           1 month ago Type II diabetes mellitus with neurological manifestations (HCC)   [place] [facility] Mecum, Erin E, PA-C   3 months ago Type II diabetes mellitus with neurological manifestations (HCC)   [place] [facility] Mecum, Erin E, PA-C   4 months  ago Hypertension associated with diabetes [facility])   [place]  [facility] Larae Grooms, NP   9 months ago Encounter for annual wellness exam in Medicare patient   [place] [facility] Larae Grooms, NP   1 year ago Type II diabetes mellitus with neurological manifestations (HCC)   [place] [facility] Mecum, [name], PA-C       Future Appointments             In 1 month Larae Grooms, NP [place] [facility]   In 2 months Larae Grooms, NP [place] [facility]             DULoxetine (CYMBALTA) 60 MG capsule [Pharmacy Med Name: DULoxetine HCl Oral Capsule Delayed Release Particles 60 MG] 90 capsule 1    Sig: TAKE 1 CAPSULE EVERY DAY     Psychiatry: Antidepressants - SNRI - duloxetine Passed - 08/03/2023  2:59 AM      Passed - Cr in normal range and within 360 days    Creatinine, Ser  Date Value Ref Range Status  04/18/2023 0.94 0.61 - 1.24 mg/dL Final         Passed - eGFR is 30 or above and within 360 days    GFR calc Af Amer  Date Value Ref Range Status  05/25/2020 107 >59 mL/min/1.73 Final    Comment:    **[facility] currently reports eGFR in compliance with the current**   recommendations of the [employer]. [facility] will   update reporting as new guidelines are published from the NKF-ASN   Task force.    GFR, Estimated  Date Value Ref Range Status  04/18/2023 >60 >60 mL/min Final    Comment:    (NOTE) Calculated using the CKD-EPI Creatinine Equation (2021)    eGFR  Date Value Ref Range Status  03/10/2023 94 >59 mL/min/1.73 Final         Passed - Completed PHQ-2 or PHQ-9 in the last 360 days      Passed - Last BP in normal range    BP Readings from Last 1 Encounters:  06/15/23 125/68         Passed - Valid encounter within last 6 months    Recent Outpatient Visits           1 month ago Type II diabetes mellitus with neurological manifestations (HCC)   [place] [facility] Mecum,  Erin E, PA-C   3 months ago Type II diabetes mellitus with neurological manifestations (HCC)   [place] [facility] Mecum, Erin E, PA-C   4 months ago Hypertension associated with diabetes [facility])   [place] [facility] Larae Grooms, NP   9 months ago Encounter for annual wellness exam in Medicare patient   [place] [facility] Larae Grooms, NP   1 year ago Type II diabetes mellitus with neurological manifestations (HCC)   [place] [facility] Mecum, [name], PA-C       Future Appointments             In 1 month Larae Grooms, NP [place] [facility]   In 2 months Larae Grooms, NP [place] [facility], [facility]             metFORMIN (GLUCOPHAGE) 1000 MG tablet [Pharmacy Med Name: metFORMIN HCl Oral Tablet 1000 MG] 180 tablet 1    Sig: TAKE 1 TABLET  TWICE DAILY WITH MEALS     Endocrinology:  Diabetes - Biguanides Passed - 08/03/2023  2:59 AM      Passed - Cr in normal range and within 360 days    Creatinine, Ser  Date Value Ref Range Status  04/18/2023 0.94 0.61 - 1.24 mg/dL Final         Passed - HBA1C is between 0 and 7.9 and within 180 days    Hemoglobin A1C  Date Value Ref Range Status  05/22/2016 7.6  Final   HB A1C (BAYER DCA - WAIVED)  Date Value Ref Range Status  07/01/2022 7.2 (H) 4.8 - 5.6 % Final    Comment:             Prediabetes: 5.7 - 6.4          Diabetes: >6.4          Glycemic control for adults with diabetes: <7.0    Hgb A1c MFr Bld  Date Value Ref Range Status  06/15/2023 7.9 (H) 4.8 - 5.6 % Final    Comment:             Prediabetes: 5.7 - 6.4          Diabetes: >6.4          Glycemic control for adults with diabetes: <7.0          Passed - eGFR in normal range and within 360 days    GFR calc Af Amer  Date Value Ref Range Status  05/25/2020 107 >59 mL/min/1.73 Final    Comment:    **[facility] currently reports eGFR in compliance  with the current**   recommendations of the [employer]. [facility] will   update reporting as new guidelines are published from the NKF-ASN   Task force.    GFR, Estimated  Date Value Ref Range Status  04/18/2023 >60 >60 mL/min Final    Comment:    (NOTE) Calculated using the CKD-EPI Creatinine Equation (2021)    eGFR  Date Value Ref Range Status  03/10/2023 94 >59 mL/min/1.73 Final         Passed - B12 Level in normal range and within 720 days    Vitamin B-12  Date Value Ref Range Status  03/10/2023 465 232 - 1,245 pg/mL Final         Passed - Valid encounter within last 6 months    Recent Outpatient Visits           1 month ago Type II diabetes mellitus with neurological manifestations (HCC)   [place] [facility] Mecum, Erin E, PA-C   3 months ago Type II diabetes mellitus with neurological manifestations (HCC)   [place] [facility] Mecum, Erin E, PA-C   4 months ago Hypertension associated with diabetes [facility])   [place] [facility] Larae Grooms, NP   9 months ago Encounter for annual wellness exam in Medicare patient   [place] [facility] Larae Grooms, NP   1 year ago Type II diabetes mellitus with neurological manifestations [facility])   [place] [facility] Mecum, [name], PA-C       Future Appointments             In 1 month Larae Grooms, NP  [facility]   In 2 months Larae Grooms, NP  [facility], [facility]            Passed - CBC  within normal limits and completed in the last 12 months    WBC  Date Value Ref Range Status  04/18/2023 13.8 (H) 4.0 - 10.5 K/uL Final   RBC  Date Value Ref Range Status  04/18/2023 4.99 4.22 - 5.81 MIL/uL Final   Hemoglobin  Date Value Ref Range Status  04/18/2023 15.5 13.0 - 17.0 g/dL Final  16/06/9603 54.0 13.0 - 17.7 g/dL Final   HCT  Date Value Ref Range  Status  04/18/2023 46.6 39.0 - 52.0 % Final   Hematocrit  Date Value Ref Range Status  10/09/2022 46.3 37.5 - 51.0 % Final   MCHC  Date Value Ref Range Status  04/18/2023 33.3 30.0 - 36.0 g/dL Final   [facility]  Date Value Ref Range Status  04/18/2023 31.1 26.0 - 34.0 pg Final   MCV  Date Value Ref Range Status  04/18/2023 93.4 80.0 - 100.0 fL Final  10/09/2022 94 79 - 97 fL Final   No results found for: "PLTCOUNTKUC", "LABPLAT", "POCPLA" RDW  Date Value Ref Range Status  04/18/2023 12.4 11.5 - 15.5 % Final  10/09/2022 12.3 11.6 - 15.4 % Final          traZODone (DESYREL) 50 MG tablet [Pharmacy Med Name: traZODone HCl Oral Tablet 50 MG] 90 tablet 1    Sig: TAKE 1 TABLET AT BEDTIME AS NEEDED FOR SLEEP     Psychiatry: Antidepressants - Serotonin Modulator Passed - 08/03/2023  2:59 AM      Passed - Valid encounter within last 6 months    Recent Outpatient Visits           1 month ago Type II diabetes mellitus with neurological manifestations (HCC)   [place] [facility] Mecum, Erin E, PA-C   3 months ago Type II diabetes mellitus with neurological manifestations (HCC)   [place] [facility] Mecum, Erin E, PA-C   4 months ago Hypertension associated with diabetes (HCC)   [place] [facility] Larae Grooms, NP   9 months ago Encounter for annual wellness exam in Medicare patient   [place] [facility] Larae Grooms, NP   1 year ago Type II diabetes mellitus with neurological manifestations (HCC)   [place] [facility] Mecum, [name], PA-C       Future Appointments             In 1 month Larae Grooms, NP [place] [facility], [facility]   In 2 months Larae Grooms, NP [place] [facility], [facility]             amLODipine (NORVASC) 5 MG tablet [Pharmacy Med Name: amLODIPine Besylate Oral Tablet 5 MG] 90 tablet 1    Sig: TAKE 1 TABLET EVERY DAY      Cardiovascular: Calcium Channel Blockers 2 Passed - 08/03/2023  2:59 AM      Passed - Last BP in normal range    BP Readings from Last 1 Encounters:  06/15/23 125/68         Passed - Last Heart Rate in normal range    Pulse Readings from Last 1 Encounters:  06/15/23 75         Passed - Valid encounter within last 6 months    Recent Outpatient Visits           1 month ago Type II diabetes mellitus with neurological manifestations [facility])   [place] [facility] Mecum, [name], PA-C  3 months ago Type II diabetes mellitus with neurological manifestations (HCC)   [place] [facility] Mecum, Erin E, PA-C   4 months ago Hypertension associated with diabetes [facility])   [place] [facility] Larae Grooms, NP   9 months ago Encounter for annual wellness exam in Medicare patient   [place] [facility] Larae Grooms, NP   1 year ago Type II diabetes mellitus with neurological manifestations [facility])   [place] [facility] Mecum, [name], PA-C       Future Appointments             In 1 month Larae Grooms, NP [place] [facility], [facility]   In 2 months Larae Grooms, NP [place] [facility], [facility]

## 2023-08-05 DIAGNOSIS — C44612 Basal cell carcinoma of skin of right upper limb, including shoulder: Secondary | ICD-10-CM | POA: Diagnosis not present

## 2023-09-07 DIAGNOSIS — C44319 Basal cell carcinoma of skin of other parts of face: Secondary | ICD-10-CM | POA: Diagnosis not present

## 2023-09-14 ENCOUNTER — Encounter: Payer: Self-pay | Admitting: Nurse Practitioner

## 2023-09-14 ENCOUNTER — Ambulatory Visit: Payer: Medicare HMO | Admitting: Nurse Practitioner

## 2023-09-14 VITALS — BP 130/81 | HR 63 | Temp 98.5°F | Ht 69.5 in | Wt 202.2 lb

## 2023-09-14 DIAGNOSIS — E1149 Type 2 diabetes mellitus with other diabetic neurological complication: Secondary | ICD-10-CM | POA: Diagnosis not present

## 2023-09-14 DIAGNOSIS — E785 Hyperlipidemia, unspecified: Secondary | ICD-10-CM

## 2023-09-14 DIAGNOSIS — I10 Essential (primary) hypertension: Secondary | ICD-10-CM | POA: Diagnosis not present

## 2023-09-14 DIAGNOSIS — Z7984 Long term (current) use of oral hypoglycemic drugs: Secondary | ICD-10-CM

## 2023-09-14 DIAGNOSIS — E538 Deficiency of other specified B group vitamins: Secondary | ICD-10-CM

## 2023-09-14 DIAGNOSIS — Z1211 Encounter for screening for malignant neoplasm of colon: Secondary | ICD-10-CM

## 2023-09-14 DIAGNOSIS — Z23 Encounter for immunization: Secondary | ICD-10-CM

## 2023-09-14 DIAGNOSIS — E1369 Other specified diabetes mellitus with other specified complication: Secondary | ICD-10-CM

## 2023-09-14 DIAGNOSIS — Z7985 Long-term (current) use of injectable non-insulin antidiabetic drugs: Secondary | ICD-10-CM

## 2023-09-14 DIAGNOSIS — E119 Type 2 diabetes mellitus without complications: Secondary | ICD-10-CM

## 2023-09-14 LAB — MICROALBUMIN, URINE WAIVED
Creatinine, Urine Waived: 50 mg/dL (ref 10–300)
Microalb, Ur Waived: 30 mg/L — ABNORMAL HIGH (ref 0–19)

## 2023-09-14 MED ORDER — BENAZEPRIL HCL 20 MG PO TABS
20.0000 mg | ORAL_TABLET | Freq: Every day | ORAL | 1 refills | Status: DC
Start: 2023-09-14 — End: 2023-10-12

## 2023-09-14 MED ORDER — OZEMPIC (0.25 OR 0.5 MG/DOSE) 2 MG/3ML ~~LOC~~ SOPN: 0.5000 mg | PEN_INJECTOR | SUBCUTANEOUS | 2 refills | Status: DC

## 2023-09-14 MED ORDER — EMPAGLIFLOZIN 25 MG PO TABS
25.0000 mg | ORAL_TABLET | Freq: Every day | ORAL | 1 refills | Status: DC
Start: 2023-09-14 — End: 2023-10-12

## 2023-09-14 NOTE — Assessment & Plan Note (Signed)
Chronic.  Controlled.  Continue with current medication regimen of Crestor 40mg  daily.  Refills sent today.  Labs ordered today.  Return to clinic in 3 months for reevaluation.  Call sooner if concerns arise.

## 2023-09-14 NOTE — Assessment & Plan Note (Signed)
Chronic.  Controlled.  Continue with current medication regimen on Amlodipine 5mg  and Benzapril 20mg  daily.  Labs ordered today.  Refills sent today.  Return to clinic in 3 months for reevaluation.  Call sooner if concerns arise.

## 2023-09-14 NOTE — Assessment & Plan Note (Signed)
Chronic, Controlled but has been trending up. Most recent A1c was 7.9- recheck today for monitoring, Results to dictate further management  Currently taking Ozempic 0.25 mg weekly injection, Metformin 1000 mg PO BID, Jardiance 25 mgPO every day (has been out of Jaridance for about 6 weeks but would like refill sent today) Jardiance refills sent per request  Ozempic increased to 0.5mg  weekly. Follow up in 3 months or sooner if concerns arise

## 2023-09-14 NOTE — Progress Notes (Signed)
BP 130/81 (BP Location: Left Arm, Patient Position: Sitting, Cuff Size: Normal)   Pulse 63   Temp 98.5 F (36.9 C) (Oral)   Ht 5' 9.5" (1.765 m)   Wt 202 lb 3.2 oz (91.7 kg)   SpO2 97%   BMI 29.43 kg/m    Subjective:    Patient ID: Mark Allen, male    DOB: [birth year], 72 y.o.   MRN: [id number]  HPI: Mark Allen is a 72 y.o. male  Chief Complaint  Patient presents with   3 month follow up   Diabetes   Hyperlipidemia   Hypertension   Medication Refill    Ozempic and jardiance- would like to discuss medications    DIABETES Ozempic 0.25mg  weekly, metformin 1000mg  BID, Jardiance 25mg .  He has been out of the [facility] [place] for about 6 weeks.  Hypoglycemic episodes:no Polydipsia/polyuria: increased urination Visual disturbance: no Chest pain: no Paresthesias: yes Glucose Monitoring: yes  Accucheck frequency: Daily  Fasting glucose: 140-160  Post prandial:  Evening:  Before meals: Taking Insulin?:   Long acting insulin:   Short acting insulin: Blood Pressure Monitoring: not checking Retinal Examination: up to date Foot Exam: Up to Date Diabetic Education: Not Completed Pneumovax: Up to Date Influenza: Not up to Date Aspirin: no  HYPERTENSION / HYPERLIPIDEMIA Satisfied with current treatment? no Duration of hypertension: years BP monitoring frequency: not checking BP range:  BP medication side effects: no Past BP meds: amlodipine and benazepril Duration of hyperlipidemia: years Cholesterol medication side effects: no Cholesterol supplements: none Past cholesterol medications: rosuvastatin (crestor) Medication compliance: excellent compliance Aspirin: no Recent stressors: no Recurrent headaches: no Visual changes: no Palpitations: no Dyspnea: no Chest pain: no Lower extremity edema: no Dizzy/lightheaded: no  Relevant past medical, surgical, family and social history reviewed and updated as indicated. Interim medical history since our last visit  reviewed. Allergies and medications reviewed and updated.  Review of Systems  Eyes:  Negative for visual disturbance.  Respiratory:  Negative for chest tightness and shortness of breath.   Cardiovascular:  Negative for chest pain, palpitations and leg swelling.  Endocrine: Negative for polydipsia and polyuria.  Neurological:  Negative for dizziness, light-headedness, numbness and headaches.    Per HPI unless specifically indicated above     Objective:    BP 130/81 (BP Location: Left Arm, Patient Position: Sitting, Cuff Size: Normal)   Pulse 63   Temp 98.5 F (36.9 C) (Oral)   Ht 5' 9.5" (1.765 m)   Wt 202 lb 3.2 oz (91.7 kg)   SpO2 97%   BMI 29.43 kg/m   Wt Readings from Last 3 Encounters:  09/14/23 202 lb 3.2 oz (91.7 kg)  06/15/23 204 lb 6.4 oz (92.7 kg)  04/18/23 200 lb (90.7 kg)    Physical Exam Vitals and nursing note reviewed.  Constitutional:      General: He is not in acute distress.    Appearance: Normal appearance. He is not ill-appearing, toxic-appearing or diaphoretic.  HENT:     Head: Normocephalic.     Right Ear: External ear normal.     Left Ear: External ear normal.     Nose: Nose normal. No congestion or rhinorrhea.     Mouth/Throat:     Mouth: Mucous membranes are moist.  Eyes:     General:        Right eye: No discharge.        Left eye: No discharge.     Extraocular Movements: Extraocular movements intact.  Conjunctiva/sclera: Conjunctivae normal.     Pupils: Pupils are equal, round, and reactive to light.  Cardiovascular:     Rate and Rhythm: Normal rate and regular rhythm.     Heart sounds: No murmur heard. Pulmonary:     Effort: Pulmonary effort is normal. No respiratory distress.     Breath sounds: Normal breath sounds. No wheezing, rhonchi or rales.  Abdominal:     General: Abdomen is flat. Bowel sounds are normal.  Musculoskeletal:     Cervical back: Normal range of motion and neck supple.  Skin:    General: Skin is warm and  dry.     Capillary Refill: Capillary refill takes less than 2 seconds.  Neurological:     General: No focal deficit present.     Mental Status: He is alert and oriented to person, place, and time.  Psychiatric:        Mood and Affect: Mood normal.        Behavior: Behavior normal.        Thought Content: Thought content normal.        Judgment: Judgment normal.     Results for orders placed or performed in visit on 07/09/23  HM DIABETES EYE EXAM   Collection Time: 07/06/23 12:00 AM  Result Value Ref Range   HM Diabetic Eye Exam No Retinopathy No Retinopathy      Assessment & Plan:   Problem List Items Addressed This Visit       Cardiovascular and Mediastinum   Essential hypertension   Chronic.  Controlled.  Continue with current medication regimen on Amlodipine 5mg  and Benzapril 20mg  daily.  Labs ordered today.  Refills sent today.  Return to clinic in 3 months for reevaluation.  Call sooner if concerns arise.       Relevant Medications   benazepril (LOTENSIN) 20 MG tablet   Other Relevant Orders   Comp Met (CMET)     Endocrine   Type II diabetes mellitus with neurological manifestations (HCC)   Chronic, Controlled but has been trending up. Most recent A1c was 7.9- recheck today for monitoring, Results to dictate further management  Currently taking Ozempic 0.25 mg weekly injection, Metformin 1000 mg PO BID, Jardiance 25 mgPO every day (has been out of Jaridance for about 6 weeks but would like refill sent today) Jardiance refills sent per request  Ozempic increased to 0.5mg  weekly. Follow up in 3 months or sooner if concerns arise       Relevant Medications   Semaglutide,0.25 or 0.5MG /DOS, (OZEMPIC, 0.25 OR 0.5 MG/DOSE,) 2 MG/3ML SOPN   benazepril (LOTENSIN) 20 MG tablet   empagliflozin (JARDIANCE) 25 MG TABS tablet   Other Relevant Orders   Microalbumin, Urine Waived   HgB A1c   RESOLVED: Diabetes mellitus (HCC) - Primary   Relevant Medications    Semaglutide,0.25 or 0.5MG /DOS, (OZEMPIC, 0.25 OR 0.5 MG/DOSE,) 2 MG/3ML SOPN   benazepril (LOTENSIN) 20 MG tablet   empagliflozin (JARDIANCE) 25 MG TABS tablet     Other   Hyperlipidemia   Chronic.  Controlled.  Continue with current medication regimen of Crestor 40mg  daily.  Refills sent today.  Labs ordered today.  Return to clinic in 3 months for reevaluation.  Call sooner if concerns arise.       Relevant Medications   benazepril (LOTENSIN) 20 MG tablet   Other Relevant Orders   Lipid Profile   Vitamin B 12 deficiency   Labs ordered at visit today.  Will make recommendations based  on lab results.        Relevant Orders   B12   Other Visit Diagnoses       Need for influenza vaccination       Relevant Orders   Flu Vaccine Trivalent High Dose (Fluad)     Diabetes mellitus without complication (HCC)       Relevant Medications   Semaglutide,0.25 or 0.5MG /DOS, (OZEMPIC, 0.25 OR 0.5 MG/DOSE,) 2 MG/3ML SOPN   benazepril (LOTENSIN) 20 MG tablet   empagliflozin (JARDIANCE) 25 MG TABS tablet     Screening for colon cancer       Relevant Orders   Ambulatory referral to Gastroenterology        Follow up plan: Return in about 3 months (around 12/13/2023) for Physical and Fasting labs.

## 2023-09-14 NOTE — Assessment & Plan Note (Signed)
Labs ordered at visit today.  Will make recommendations based on lab results.   

## 2023-09-15 LAB — LIPID PANEL
Chol/HDL Ratio: 2.6 {ratio} (ref 0.0–5.0)
Cholesterol, Total: 134 mg/dL (ref 100–199)
HDL: 52 mg/dL (ref >39)
LDL Chol Calc (NIH): 66 mg/dL (ref 0–99)
Triglycerides: 82 mg/dL (ref 0–149)
VLDL Cholesterol Cal: 16 mg/dL (ref 5–40)

## 2023-09-15 LAB — VITAMIN B12: Vitamin B-12: 469 pg/mL (ref 232–1245)

## 2023-09-15 LAB — COMPREHENSIVE METABOLIC PANEL
ALT: 17 [IU]/L (ref 0–44)
AST: 13 [IU]/L (ref 0–40)
Albumin: 4.4 g/dL (ref 3.9–4.9)
Alkaline Phosphatase: 73 [IU]/L (ref 44–121)
BUN/Creatinine Ratio: 19 (ref 10–24)
BUN: 15 mg/dL (ref 8–27)
Bilirubin Total: 0.3 mg/dL (ref 0.0–1.2)
CO2: 23 mmol/L (ref 20–29)
Calcium: 9.4 mg/dL (ref 8.6–10.2)
Chloride: 101 mmol/L (ref 96–106)
Creatinine, Ser: 0.77 mg/dL (ref 0.76–1.27)
Globulin, Total: 2.4 g/dL (ref 1.5–4.5)
Glucose: 144 mg/dL — ABNORMAL HIGH (ref 70–99)
Potassium: 4.6 mmol/L (ref 3.5–5.2)
Sodium: 139 mmol/L (ref 134–144)
Total Protein: 6.8 g/dL (ref 6.0–8.5)
eGFR: 96 mL/min/{1.73_m2} (ref >59)

## 2023-09-15 LAB — HEMOGLOBIN A1C
Est. average glucose Bld gHb Est-mCnc: 183 mg/dL
Hgb A1c MFr Bld: 8 % — ABNORMAL HIGH (ref 4.8–5.6)

## 2023-10-07 ENCOUNTER — Encounter: Payer: Self-pay | Admitting: *Deleted

## 2023-10-12 ENCOUNTER — Encounter: Payer: Self-pay | Admitting: Nurse Practitioner

## 2023-10-12 ENCOUNTER — Ambulatory Visit (INDEPENDENT_AMBULATORY_CARE_PROVIDER_SITE_OTHER): Payer: Medicare HMO | Admitting: Nurse Practitioner

## 2023-10-12 VITALS — BP 116/76 | HR 75 | Temp 97.9°F | Ht 70.25 in | Wt 194.0 lb

## 2023-10-12 DIAGNOSIS — Z7985 Long-term (current) use of injectable non-insulin antidiabetic drugs: Secondary | ICD-10-CM | POA: Diagnosis not present

## 2023-10-12 DIAGNOSIS — E785 Hyperlipidemia, unspecified: Secondary | ICD-10-CM | POA: Diagnosis not present

## 2023-10-12 DIAGNOSIS — Z0000 Encounter for general adult medical examination without abnormal findings: Secondary | ICD-10-CM

## 2023-10-12 DIAGNOSIS — Z7984 Long term (current) use of oral hypoglycemic drugs: Secondary | ICD-10-CM

## 2023-10-12 DIAGNOSIS — G4700 Insomnia, unspecified: Secondary | ICD-10-CM

## 2023-10-12 DIAGNOSIS — E538 Deficiency of other specified B group vitamins: Secondary | ICD-10-CM

## 2023-10-12 DIAGNOSIS — I10 Essential (primary) hypertension: Secondary | ICD-10-CM

## 2023-10-12 DIAGNOSIS — Z7189 Other specified counseling: Secondary | ICD-10-CM

## 2023-10-12 DIAGNOSIS — E1149 Type 2 diabetes mellitus with other diabetic neurological complication: Secondary | ICD-10-CM | POA: Diagnosis not present

## 2023-10-12 LAB — URINALYSIS, ROUTINE W REFLEX MICROSCOPIC
Bilirubin, UA: NEGATIVE
Leukocytes,UA: NEGATIVE
Nitrite, UA: NEGATIVE
Protein,UA: NEGATIVE
RBC, UA: NEGATIVE
Specific Gravity, UA: 1.02 (ref 1.005–1.030)
Urobilinogen, Ur: 0.2 mg/dL (ref 0.2–1.0)
pH, UA: 5.5 (ref 5.0–7.5)

## 2023-10-12 MED ORDER — ROSUVASTATIN CALCIUM 40 MG PO TABS: 40.0000 mg | ORAL_TABLET | Freq: Every day | ORAL | 1 refills | Status: DC

## 2023-10-12 MED ORDER — BENAZEPRIL HCL 20 MG PO TABS: 20.0000 mg | ORAL_TABLET | Freq: Every day | ORAL | 1 refills | Status: DC

## 2023-10-12 MED ORDER — OZEMPIC (0.25 OR 0.5 MG/DOSE) 2 MG/3ML ~~LOC~~ SOPN: 0.5000 mg | PEN_INJECTOR | SUBCUTANEOUS | 2 refills | Status: DC

## 2023-10-12 MED ORDER — AMLODIPINE BESYLATE 5 MG PO TABS: 5.0000 mg | ORAL_TABLET | Freq: Every day | ORAL | 1 refills | Status: DC

## 2023-10-12 MED ORDER — EMPAGLIFLOZIN 25 MG PO TABS: 25.0000 mg | ORAL_TABLET | Freq: Every day | ORAL | 1 refills | Status: DC

## 2023-10-12 MED ORDER — DULOXETINE HCL 60 MG PO CPEP: 60.0000 mg | ORAL_CAPSULE | Freq: Every day | ORAL | 1 refills | Status: DC

## 2023-10-12 MED ORDER — METFORMIN HCL 1000 MG PO TABS: 1000.0000 mg | ORAL_TABLET | Freq: Two times a day (BID) | ORAL | 1 refills | Status: DC

## 2023-10-12 MED ORDER — TRAZODONE HCL 50 MG PO TABS: 50.0000 mg | ORAL_TABLET | Freq: Every evening | ORAL | 1 refills | Status: DC | PRN

## 2023-10-12 NOTE — Assessment & Plan Note (Signed)
 Chronic.  Controlled.  Continue with current medication regimen.  Labs ordered today.  Return to clinic in 6 months for reevaluation.  Call sooner if concerns arise.  ? ?

## 2023-10-12 NOTE — Progress Notes (Signed)
 BP 116/76 (BP Location: Left Arm, Patient Position: Sitting, Cuff Size: Large)   Pulse 75   Temp 97.9 F (36.6 C) (Oral)   Ht 5' 10.25 (1.784 m)   Wt 194 lb (88 kg)   SpO2 97%   BMI 27.64 kg/m    Subjective:    Patient ID: Mark Allen, male    DOB: 02-10-1951, 73 y.o.   MRN: [id number]  HPI: Mark Allen is a 73 y.o. male presenting on 10/12/2023 for comprehensive medical examination. Current medical complaints include:none  He currently lives with: Interim Problems from his last visit: no  DIABETES Hypoglycemic episodes: None Polydipsia/polyuria: some days Visual disturbance: no Chest pain: no Paresthesias: yes Glucose Monitoring: yes             Accucheck frequency: Daily             Fasting glucose: 110-120             Post prandial:             Evening:             Before meals: Taking Insulin ?: no             Long acting insulin :              Short acting insulin : Blood Pressure Monitoring: not checking Retinal Examination: Up to Date Foot Exam: Up to Date Diabetic Education: Not Completed Pneumovax: Up to Date Influenza: Up to date Aspirin: no   HYPERTENSION / HYPERLIPIDEMIA Satisfied with current treatment? no Duration of hypertension: years BP monitoring frequency: not checking BP range:  BP medication side effects: no Past BP meds: amlodipine  and benazepril  Duration of hyperlipidemia: years Cholesterol medication side effects: no Cholesterol supplements: none Past cholesterol medications: rosuvastatin  (crestor ) Medication compliance: excellent compliance Aspirin: no Recent stressors: no Recurrent headaches: no Visual changes: no Palpitations: no Dyspnea: no Chest pain: no Lower extremity edema: no Dizzy/lightheaded: no   Functional Status Survey: Is the patient deaf or have difficulty hearing?: Yes (has hearing aide, left ear problems) Does the patient have difficulty seeing, even when wearing glasses/contacts?: No Does the patient  have difficulty concentrating, remembering, or making decisions?: No Does the patient have difficulty walking or climbing stairs?: No Does the patient have difficulty dressing or bathing?: No Does the patient have difficulty doing errands alone such as visiting a doctor's office or shopping?: No  FALL RISK:    04/13/2023    3:38 PM 03/10/2023    8:43 AM 11/04/2022    8:24 AM 10/31/2022   10:14 AM 10/09/2022   10:24 AM  Fall Risk   Falls in the past year? 0 0 0 0 0  Number falls in past yr: 0 0 0 0 0  Injury with Fall? 0 0 0 0 0  Risk for fall due to : No Fall Risks No Fall Risks No Fall Risks  No Fall Risks  Follow up Falls evaluation completed Falls evaluation completed Falls prevention discussed;Falls evaluation completed  Falls evaluation completed    Depression Screen    10/12/2023    9:03 AM 09/14/2023    1:17 PM 06/15/2023    3:04 PM 04/13/2023    3:38 PM 03/10/2023    8:43 AM  Depression screen PHQ 2/9  Decreased Interest 0 0 0 0 0  Down, Depressed, Hopeless 0 0 0 0 0  PHQ - 2 Score 0 0 0 0 0  Altered sleeping 0 0 0 0 0  Tired, decreased energy 0 0 1 0 1  Change in appetite 0 0 0 0 0  Feeling bad or failure about yourself  0 0 0 0 0  Trouble concentrating 0 0 0 0 0  Moving slowly or fidgety/restless 0 0 0 0 0  Suicidal thoughts 0 0 0 0 0  PHQ-9 Score 0 0 1 0 1  Difficult doing work/chores   Not difficult at all Not difficult at all Not difficult at all    Advanced Directives Has an Advance directive.  Does not wish to make any changes.   Past Medical History:  Past Medical History:  Diagnosis Date   Broken leg 06/2018   Left leg   Calculus of kidney    Diabetes mellitus without complication (HCC) 07/30/2021   Hyperlipidemia    Hypertension    Impotence, organic    Insomnia    Need for shingles vaccine 07/30/2021   Testicular hypofunction    Type II diabetes mellitus with neurological manifestations [facility])     Surgical History:  Past Surgical History:   Procedure Laterality Date   COLONOSCOPY WITH PROPOFOL  N/A 10/22/2018   Procedure: COLONOSCOPY WITH PROPOFOL ;  Surgeon: Therisa Bi, MD;  Location: [facility] ENDOSCOPY;  Service: Gastroenterology;  Laterality: N/A;   NASAL SINUS SURGERY  2004   REFRACTIVE SURGERY Bilateral    SPLENECTOMY  1963   TONSILLECTOMY      Medications:  Current Outpatient Medications on File Prior to Visit  Medication Sig   Blood Glucose Calibration (TRUE METRIX LEVEL 3) High SOLN    Blood Glucose Monitoring Suppl (TRUE METRIX METER) w/Device KIT USE AS DIRECTED   cyanocobalamin  1000 MCG tablet Take 1,000 mcg by mouth daily.   fluticasone (FLONASE) 50 MCG/ACT nasal spray Place 2 sprays into both nostrils daily.   glucose blood (TRUE METRIX BLOOD GLUCOSE TEST) test strip TEST BLOOD SUGAR TWICE DAILY USE AS INSTRUCTED   TRUEplus Lancets 28G MISC 1 each by Other route in the morning and at bedtime.   No current facility-administered medications on file prior to visit.    Allergies:  Allergies  Allergen Reactions   Sulfa Antibiotics    Sulfacetamide Sodium     Social History:  Social History   Socioeconomic History   Marital status: Married    Spouse name: Not on file   Number of children: Not on file   Years of education: Not on file   Highest education level: Some college, no degree  Occupational History   Not on file  Tobacco Use   Smoking status: Former    Current packs/day: 0.00    Types: Cigarettes    Quit date: 01/27/1989    Years since quitting: 34.7   Smokeless tobacco: Former    Types: Chew    Quit date: 09/29/1990  Vaping Use   Vaping status: Never Used  Substance and Sexual Activity   Alcohol use: Yes    Alcohol/week: 0.0 standard drinks of alcohol    Comment: rare,none last 24hrs   Drug use: No   Sexual activity: Yes  Other Topics Concern   Not on file  Social History Narrative   Not on file   Social Drivers of Health   Financial Resource Strain: Low Risk  (09/14/2023)    Overall Financial Resource Strain (CARDIA)    Difficulty of Paying Living Expenses: Not hard at all  Food Insecurity: No Food Insecurity (09/14/2023)   Hunger Vital Sign    Worried About Programme Researcher, Broadcasting/film/video in  the Last Year: Never true    Ran Out of Food in the Last Year: Never true  Transportation Needs: No Transportation Needs (09/14/2023)   PRAPARE - Administrator, Civil Service (Medical): No    Lack of Transportation (Non-Medical): No  Physical Activity: Insufficiently Active (09/14/2023)   Exercise Vital Sign    Days of Exercise per Week: 1 day    Minutes of Exercise per Session: 10 min  Stress: No Stress Concern Present (09/14/2023)   [employer] of Occupational Health - Occupational Stress Questionnaire    Feeling of Stress : Not at all  Social Connections: Unknown (09/14/2023)   Social Connection and Isolation Panel [NHANES]    Frequency of Communication with Friends and Family: More than three times a week    Frequency of Social Gatherings with Friends and Family: More than three times a week    Attends Religious Services: Patient declined    Database Administrator or Organizations: No    Attends Engineer, Structural: More than 4 times per year    Marital Status: Married  Catering Manager Violence: Not At Risk (11/04/2022)   Humiliation, Afraid, Rape, and Kick questionnaire    Fear of Current or Ex-Partner: No    Emotionally Abused: No    Physically Abused: No    Sexually Abused: No   Social History   Tobacco Use  Smoking Status Former   Current packs/day: 0.00   Types: Cigarettes   Quit date: 01/27/1989   Years since quitting: 34.7  Smokeless Tobacco Former   Types: Chew   Quit date: 09/29/1990   Social History   Substance and Sexual Activity  Alcohol Use Yes   Alcohol/week: 0.0 standard drinks of alcohol   Comment: rare,none last 24hrs    Family History:  Family History  Problem Relation Age of Onset   Cancer Father     Past  medical history, surgical history, medications, allergies, family history and social history reviewed with patient today and changes made to appropriate areas of the chart.   Review of Systems  HENT:         Denies vision changes.  Eyes:  Negative for blurred vision and double vision.  Respiratory:  Negative for shortness of breath.   Cardiovascular:  Negative for chest pain, palpitations and leg swelling.  Neurological:  Negative for dizziness, tingling and headaches.  Endo/Heme/Allergies:  Positive for polydipsia.       Denies Polyuria   All other ROS negative except what is listed above and in the HPI.      Objective:    BP 116/76 (BP Location: Left Arm, Patient Position: Sitting, Cuff Size: Large)   Pulse 75   Temp 97.9 F (36.6 C) (Oral)   Ht 5' 10.25 (1.784 m)   Wt 194 lb (88 kg)   SpO2 97%   BMI 27.64 kg/m   Wt Readings from Last 3 Encounters:  10/12/23 194 lb (88 kg)  09/14/23 202 lb 3.2 oz (91.7 kg)  06/15/23 204 lb 6.4 oz (92.7 kg)    No results found.  Physical Exam Vitals and nursing note reviewed.  Constitutional:      General: He is not in acute distress.    Appearance: Normal appearance. He is not ill-appearing, toxic-appearing or diaphoretic.  HENT:     Head: Normocephalic.     Right Ear: Tympanic membrane, ear canal and external ear normal.     Left Ear: Tympanic membrane, ear canal and external  ear normal.     Nose: Nose normal. No congestion or rhinorrhea.     Mouth/Throat:     Mouth: Mucous membranes are moist.  Eyes:     General:        Right eye: No discharge.        Left eye: No discharge.     Extraocular Movements: Extraocular movements intact.     Conjunctiva/sclera: Conjunctivae normal.     Pupils: Pupils are equal, round, and reactive to light.  Cardiovascular:     Rate and Rhythm: Normal rate and regular rhythm.     Heart sounds: No murmur heard. Pulmonary:     Effort: Pulmonary effort is normal. No respiratory distress.     Breath  sounds: Normal breath sounds. No wheezing, rhonchi or rales.  Abdominal:     General: Abdomen is flat. Bowel sounds are normal. There is no distension.     Palpations: Abdomen is soft.     Tenderness: There is no abdominal tenderness. There is no guarding.  Musculoskeletal:     Cervical back: Normal range of motion and neck supple.  Skin:    General: Skin is warm and dry.     Capillary Refill: Capillary refill takes less than 2 seconds.  Neurological:     General: No focal deficit present.     Mental Status: He is alert and oriented to person, place, and time.     Cranial Nerves: No cranial nerve deficit.     Motor: No weakness.     Deep Tendon Reflexes: Reflexes normal.  Psychiatric:        Mood and Affect: Mood normal.        Behavior: Behavior normal.        Thought Content: Thought content normal.        Judgment: Judgment normal.        10/12/2023    9:00 AM 11/04/2022    8:29 AM 08/31/2020    2:38 PM  6CIT Screen  What Year? 0 points 0 points 0 points  What month? 0 points 0 points 0 points  What time? 0 points 0 points 0 points  Count back from 20 0 points 0 points 0 points  Months in reverse 0 points 0 points 0 points  Repeat phrase 0 points 2 points 0 points  Total Score 0 points 2 points 0 points    Results for orders placed or performed in visit on 09/14/23  Microalbumin, Urine Waived   Collection Time: 09/14/23  1:36 PM  Result Value Ref Range   Microalb, Ur Waived 30 (H) 0 - 19 mg/L   Creatinine, Urine Waived 50 10 - 300 mg/dL   Microalb/Creat Ratio 30-300 (H) <30 mg/g  Comp Met (CMET)   Collection Time: 09/14/23  1:37 PM  Result Value Ref Range   Glucose 144 (H) 70 - 99 mg/dL   BUN 15 8 - 27 mg/dL   Creatinine, Ser 9.22 0.76 - 1.27 mg/dL   eGFR 96 >40 fO/fpw/8.26   BUN/Creatinine Ratio 19 10 - 24   Sodium 139 134 - 144 mmol/L   Potassium 4.6 3.5 - 5.2 mmol/L   Chloride 101 96 - 106 mmol/L   CO2 23 20 - 29 mmol/L   Calcium  9.4 8.6 - 10.2 mg/dL    Total Protein 6.8 6.0 - 8.5 g/dL   Albumin 4.4 3.9 - 4.9 g/dL   Globulin, Total 2.4 1.5 - 4.5 g/dL   Bilirubin Total 0.3 0.0 - 1.2 mg/dL  Alkaline Phosphatase 73 44 - 121 IU/L   AST 13 0 - 40 IU/L   ALT 17 0 - 44 IU/L  HgB A1c   Collection Time: 09/14/23  1:37 PM  Result Value Ref Range   Hgb A1c MFr Bld 8.0 (H) 4.8 - 5.6 %   Est. average glucose Bld gHb Est-mCnc 183 mg/dL  A87   Collection Time: 09/14/23  1:37 PM  Result Value Ref Range   Vitamin B-12 469 232 - 1,245 pg/mL  Lipid Profile   Collection Time: 09/14/23  1:37 PM  Result Value Ref Range   Cholesterol, Total 134 100 - 199 mg/dL   Triglycerides 82 0 - 149 mg/dL   HDL 52 >60 mg/dL   VLDL Cholesterol Cal 16 5 - 40 mg/dL   LDL Chol Calc (NIH) 66 0 - 99 mg/dL   Chol/HDL Ratio 2.6 0.0 - 5.0 ratio      Assessment & Plan:   Problem List Items Addressed This Visit       Cardiovascular and Mediastinum   Essential hypertension   Chronic.  Controlled.  Continue with current medication regimen on Amlodipine  5mg  and Benzapril 20mg  daily.  Labs ordered today.  Refills sent today.  Return to clinic in 3 months for reevaluation.  Call sooner if concerns arise.       Relevant Medications   amLODipine  (NORVASC ) 5 MG tablet   benazepril  (LOTENSIN ) 20 MG tablet   rosuvastatin  (CRESTOR ) 40 MG tablet     Endocrine   Type II diabetes mellitus with neurological manifestations (HCC)   Chronic, Controlled but has been trending up. Most recent A1c was 8- recheck today for monitoring, Results to dictate further management  Currently taking Ozempic  0.5 mg weekly injection, Metformin  1000 mg PO BID, Jardiance  25 mgPO every day (has been out of Jaridance for about 6 weeks but would like refill sent today) Ozempic  increased to 0.5mg  weekly. Follow up in 3 months or sooner if concerns arise       Relevant Medications   benazepril  (LOTENSIN ) 20 MG tablet   empagliflozin  (JARDIANCE ) 25 MG TABS tablet   metFORMIN  (GLUCOPHAGE ) 1000 MG  tablet   rosuvastatin  (CRESTOR ) 40 MG tablet   Semaglutide ,0.25 or 0.5MG /DOS, (OZEMPIC , 0.25 OR 0.5 MG/DOSE,) 2 MG/3ML SOPN   Other Relevant Orders   Hemoglobin A1c     Other   Hyperlipidemia   Chronic.  Controlled.  Continue with current medication regimen of Crestor  40mg  daily.  Refills sent today.  Labs ordered today.  Return to clinic in 3 months for reevaluation.  Call sooner if concerns arise.       Relevant Medications   amLODipine  (NORVASC ) 5 MG tablet   benazepril  (LOTENSIN ) 20 MG tablet   rosuvastatin  (CRESTOR ) 40 MG tablet   Other Relevant Orders   Lipid panel   Insomnia   Chronic.  Controlled.  Continue with current medication regimen.  Labs ordered today.  Return to clinic in 6 months for reevaluation.  Call sooner if concerns arise.        Relevant Medications   traZODone  (DESYREL ) 50 MG tablet   Vitamin B 12 deficiency   Labs ordered at visit today.  Will make recommendations based on lab results.        Relevant Orders   B12   Advanced care planning/counseling discussion   A voluntary discussion about advance care planning including the explanation and discussion of advance directives was extensively discussed  with the patient for 5 minutes with patient.  Explanation about the health care proxy and Living will was reviewed and packet with forms with explanation of how to fill them out was given.  During this discussion, the patient was able to identify a health care proxy as his wife and already has the paperwork filled out and does not wish to make any changes.        Other Visit Diagnoses       Encounter for annual wellness exam in Medicare patient    -  Primary     Annual physical exam       Health maintenance reviewed during visit today.  Labs ordered.  Vaccines up to date. Colonoscopy due this month he plans to schedule it.   Relevant Orders   Hemoglobin A1c   B12   TSH   PSA   Lipid panel   CBC with Differential/Platelet   Comprehensive metabolic  panel   Urinalysis, Routine w reflex microscopic        Preventative Services:  Health Risk Assessment and Personalized Prevention Plan: Up to date Bone Mass Measurements: NA CVD Screening: Up to date Colon Cancer Screening: Up to date Depression Screening: Up to date Diabetes Screening: Up to date Glaucoma Screening: Up to date Hepatitis B vaccine: NA Hepatitis C screening: Up to date HIV Screening: Up to date Flu Vaccine: Up to date Lung cancer Screening: NA Obesity Screening: Up to date Pneumonia Vaccines (2): Up to date STI Screening: NA PSA screening: UP to date  Discussed aspirin prophylaxis for myocardial infarction prevention and decision was it was not indicated  LABORATORY TESTING:  Health maintenance labs ordered today as discussed above.   The natural history of prostate cancer and ongoing controversy regarding screening and potential treatment outcomes of prostate cancer has been discussed with the patient. The meaning of a false positive PSA and a false negative PSA has been discussed. He indicates understanding of the limitations of this screening test and wishes to proceed with screening PSA testing.   IMMUNIZATIONS:   - Tdap: Tetanus vaccination status reviewed: last tetanus booster within 10 years. - Influenza: Up to date - Pneumovax: Up to date - Prevnar: Up to date - Zostavax vaccine: Up to date  SCREENING: - Colonoscopy: Up to date  Discussed with patient purpose of the colonoscopy is to detect colon cancer at curable precancerous or early stages   - AAA Screening: Not applicable  -Hearing Test: Not applicable  -Spirometry: Not applicable   PATIENT COUNSELING:    Sexuality: Discussed sexually transmitted diseases, partner selection, use of condoms, avoidance of unintended pregnancy  and contraceptive alternatives.   Advised to avoid cigarette smoking.  I discussed with the patient that most people either abstain from alcohol or drink within  safe limits (<=14/week and <=4 drinks/occasion for males, <=7/weeks and <= 3 drinks/occasion for females) and that the risk for alcohol disorders and other health effects rises proportionally with the number of drinks per week and how often a drinker exceeds daily limits.  Discussed cessation/primary prevention of drug use and availability of treatment for abuse.   Diet: Encouraged to adjust caloric intake to maintain  or achieve ideal body weight, to reduce intake of dietary saturated fat and total fat, to limit sodium intake by avoiding high sodium foods and not adding table salt, and to maintain adequate dietary potassium and calcium  preferably from fresh fruits, vegetables, and low-fat dairy products.    stressed the importance of regular exercise  Injury prevention: Discussed safety belts, safety helmets,  smoke detector, smoking near bedding or upholstery.   Dental health: Discussed importance of regular tooth brushing, flossing, and dental visits.   Follow up plan: NEXT PREVENTATIVE PHYSICAL DUE IN 1 YEAR. Return in about 3 months (around 01/10/2024) for HTN, HLD, DM2 FU.

## 2023-10-12 NOTE — Assessment & Plan Note (Signed)
 Chronic, Controlled but has been trending up. Most recent A1c was 8- recheck today for monitoring, Results to dictate further management  Currently taking Ozempic  0.5 mg weekly injection, Metformin  1000 mg PO BID, Jardiance  25 mgPO every day (has been out of Jaridance for about 6 weeks but would like refill sent today) Ozempic  increased to 0.5mg  weekly. Follow up in 3 months or sooner if concerns arise

## 2023-10-12 NOTE — Assessment & Plan Note (Signed)
 Labs ordered at visit today.  Will make recommendations based on lab results.

## 2023-10-12 NOTE — Assessment & Plan Note (Signed)
 A voluntary discussion about advance care planning including the explanation and discussion of advance directives was extensively discussed  with the patient for 5 minutes with patient.  Explanation about the health care proxy and Living will was reviewed and packet with forms with explanation of how to fill them out was given.  During this discussion, the patient was able to identify a health care proxy as his wife and already has the paperwork filled out and does not wish to make any changes.

## 2023-10-12 NOTE — Assessment & Plan Note (Signed)
 Chronic.  Controlled.  Continue with current medication regimen on Amlodipine 5mg  and Benzapril 20mg  daily.  Labs ordered today.  Refills sent today.  Return to clinic in 3 months for reevaluation.  Call sooner if concerns arise.

## 2023-10-12 NOTE — Assessment & Plan Note (Signed)
 Chronic.  Controlled.  Continue with current medication regimen of Crestor 40mg  daily.  Refills sent today.  Labs ordered today.  Return to clinic in 3 months for reevaluation.  Call sooner if concerns arise.

## 2023-10-13 LAB — LIPID PANEL
Chol/HDL Ratio: 2.8 {ratio} (ref 0.0–5.0)
Cholesterol, Total: 160 mg/dL (ref 100–199)
HDL: 57 mg/dL (ref >39)
LDL Chol Calc (NIH): 82 mg/dL (ref 0–99)
Triglycerides: 116 mg/dL (ref 0–149)
VLDL Cholesterol Cal: 21 mg/dL (ref 5–40)

## 2023-10-13 LAB — CBC WITH DIFFERENTIAL/PLATELET
Basophils Absolute: 0 10*3/uL (ref 0.0–0.2)
Basos: 1 %
EOS (ABSOLUTE): 0.1 10*3/uL (ref 0.0–0.4)
Eos: 2 %
Hematocrit: 47.7 % (ref 37.5–51.0)
Hemoglobin: 15.6 g/dL (ref 13.0–17.7)
Immature Grans (Abs): 0 10*3/uL (ref 0.0–0.1)
Immature Granulocytes: 0 %
Lymphocytes Absolute: 1.8 10*3/uL (ref 0.7–3.1)
Lymphs: 25 %
MCH: 31.5 pg (ref 26.6–33.0)
MCHC: 32.7 g/dL (ref 31.5–35.7)
MCV: 96 fL (ref 79–97)
Monocytes Absolute: 0.7 10*3/uL (ref 0.1–0.9)
Monocytes: 10 %
Neutrophils Absolute: 4.5 10*3/uL (ref 1.4–7.0)
Neutrophils: 62 %
Platelets: 372 10*3/uL (ref 150–450)
RBC: 4.95 x10E6/uL (ref 4.14–5.80)
RDW: 12.4 % (ref 11.6–15.4)
WBC: 7.2 10*3/uL (ref 3.4–10.8)

## 2023-10-13 LAB — COMPREHENSIVE METABOLIC PANEL
ALT: 18 [IU]/L (ref 0–44)
AST: 15 [IU]/L (ref 0–40)
Albumin: 4.5 g/dL (ref 3.9–4.9)
Alkaline Phosphatase: 76 [IU]/L (ref 44–121)
BUN/Creatinine Ratio: 22 (ref 10–24)
BUN: 21 mg/dL (ref 8–27)
Bilirubin Total: 0.3 mg/dL (ref 0.0–1.2)
CO2: 22 mmol/L (ref 20–29)
Calcium: 9.5 mg/dL (ref 8.6–10.2)
Chloride: 100 mmol/L (ref 96–106)
Creatinine, Ser: 0.95 mg/dL (ref 0.76–1.27)
Globulin, Total: 2.9 g/dL (ref 1.5–4.5)
Glucose: 162 mg/dL — ABNORMAL HIGH (ref 70–99)
Potassium: 4.7 mmol/L (ref 3.5–5.2)
Sodium: 138 mmol/L (ref 134–144)
Total Protein: 7.4 g/dL (ref 6.0–8.5)
eGFR: 86 mL/min/{1.73_m2} (ref >59)

## 2023-10-13 LAB — VITAMIN B12: Vitamin B-12: 597 pg/mL (ref 232–1245)

## 2023-10-13 LAB — PSA: Prostate Specific Ag, Serum: 0.3 ng/mL (ref 0.0–4.0)

## 2023-10-13 LAB — TSH: TSH: 1.76 u[IU]/mL (ref 0.450–4.500)

## 2023-10-13 LAB — HEMOGLOBIN A1C
Est. average glucose Bld gHb Est-mCnc: 174 mg/dL
Hgb A1c MFr Bld: 7.7 % — ABNORMAL HIGH (ref 4.8–5.6)

## 2023-12-28 ENCOUNTER — Other Ambulatory Visit: Payer: Self-pay | Admitting: Nurse Practitioner

## 2023-12-28 DIAGNOSIS — G4700 Insomnia, unspecified: Secondary | ICD-10-CM

## 2023-12-28 DIAGNOSIS — I10 Essential (primary) hypertension: Secondary | ICD-10-CM

## 2023-12-28 DIAGNOSIS — E785 Hyperlipidemia, unspecified: Secondary | ICD-10-CM

## 2023-12-29 NOTE — Telephone Encounter (Signed)
 Requested medication (s) are due for refill today: na   Requested medication (s) are on the active medication list: yes   Last refill:  10/12/23  Future visit scheduled: yes in 2 weeks   Notes to clinic:   pharmacy requesting 1 year supply     Requested Prescriptions  Pending Prescriptions Disp Refills   amLODipine (NORVASC) 5 MG tablet [Pharmacy Med Name: amLODIPine Besylate Oral Tablet 5 MG] 90 tablet 3    Sig: TAKE 1 TABLET EVERY DAY     Cardiovascular: Calcium Channel Blockers 2 Failed - 12/29/2023  4:29 PM      Failed - Valid encounter within last 6 months    Recent Outpatient Visits   None     Future Appointments             In 2 weeks Larae Grooms, NP [place] [facility]            Passed - Last BP in normal range    BP Readings from Last 1 Encounters:  10/12/23 116/76         Passed - Last Heart Rate in normal range    Pulse Readings from Last 1 Encounters:  10/12/23 75          metFORMIN (GLUCOPHAGE) 1000 MG tablet [Pharmacy Med Name: metFORMIN HCl Oral Tablet 1000 MG] 180 tablet 3    Sig: TAKE 1 TABLET TWICE DAILY WITH MEALS     Endocrinology:  Diabetes - Biguanides Failed - 12/29/2023  4:29 PM      Failed - Valid encounter within last 6 months    Recent Outpatient Visits   None     Future Appointments             In 2 weeks Larae Grooms, NP [place] [facility]            Passed - Cr in normal range and within 360 days    Creatinine, Ser  Date Value Ref Range Status  10/12/2023 0.95 0.76 - 1.27 mg/dL Final         Passed - HBA1C is between 0 and 7.9 and within 180 days    Hemoglobin A1C  Date Value Ref Range Status  05/22/2016 7.6  Final   HB A1C (BAYER DCA - WAIVED)  Date Value Ref Range Status  07/01/2022 7.2 (H) 4.8 - 5.6 % Final    Comment:             Prediabetes: 5.7 - 6.4          Diabetes: >6.4          Glycemic control for adults with diabetes: <7.0    Hgb A1c MFr Bld   Date Value Ref Range Status  10/12/2023 7.7 (H) 4.8 - 5.6 % Final    Comment:             Prediabetes: 5.7 - 6.4          Diabetes: >6.4          Glycemic control for adults with diabetes: <7.0          Passed - eGFR in normal range and within 360 days    GFR calc Af Amer  Date Value Ref Range Status  05/25/2020 107 >59 mL/min/1.73 Final    Comment:    **[facility] currently reports eGFR in compliance with the current**   recommendations of the [employer]. [facility] will   update reporting as  new guidelines are published from the NKF-ASN   Task force.    GFR, Estimated  Date Value Ref Range Status  04/18/2023 >60 >60 mL/min Final    Comment:    (NOTE) Calculated using the CKD-EPI Creatinine Equation (2021)    eGFR  Date Value Ref Range Status  10/12/2023 86 >59 mL/min/1.73 Final         Passed - B12 Level in normal range and within 720 days    Vitamin B-12  Date Value Ref Range Status  10/12/2023 597 232 - 1,245 pg/mL Final         Passed - CBC within normal limits and completed in the last 12 months    WBC  Date Value Ref Range Status  10/12/2023 7.2 3.4 - 10.8 x10E3/uL Final  04/18/2023 13.8 (H) 4.0 - 10.5 K/uL Final   RBC  Date Value Ref Range Status  10/12/2023 4.95 4.14 - 5.80 x10E6/uL Final  04/18/2023 4.99 4.22 - 5.81 MIL/uL Final   Hemoglobin  Date Value Ref Range Status  10/12/2023 15.6 13.0 - 17.7 g/dL Final   Hematocrit  Date Value Ref Range Status  10/12/2023 47.7 37.5 - 51.0 % Final   MCHC  Date Value Ref Range Status  10/12/2023 32.7 31.5 - 35.7 g/dL Final  16/06/9603 54.0 30.0 - 36.0 g/dL Final   [facility]  Date Value Ref Range Status  10/12/2023 31.5 26.6 - 33.0 pg Final  04/18/2023 31.1 26.0 - 34.0 pg Final   MCV  Date Value Ref Range Status  10/12/2023 96 79 - 97 fL Final   No results found for: "PLTCOUNTKUC", "LABPLAT", "POCPLA" RDW  Date Value Ref Range Status  10/12/2023 12.4 11.6 - 15.4 % Final           traZODone (DESYREL) 50 MG tablet [Pharmacy Med Name: traZODone HCl Oral Tablet 50 MG] 90 tablet 3    Sig: TAKE 1 TABLET AT BEDTIME AS NEEDED FOR SLEEP     Psychiatry: Antidepressants - Serotonin Modulator Failed - 12/29/2023  4:29 PM      Failed - Valid encounter within last 6 months    Recent Outpatient Visits   None     Future Appointments             In 2 weeks Larae Grooms, NP [place] [facility]             DULoxetine (CYMBALTA) 60 MG capsule [Pharmacy Med Name: DULoxetine HCl Oral Capsule Delayed Release Particles 60 MG] 90 capsule 3    Sig: TAKE 1 CAPSULE EVERY DAY     Psychiatry: Antidepressants - SNRI - duloxetine Failed - 12/29/2023  4:29 PM      Failed - Valid encounter within last 6 months    Recent Outpatient Visits   None     Future Appointments             In 2 weeks Larae Grooms, NP [place] [facility]            Passed - Cr in normal range and within 360 days    Creatinine, Ser  Date Value Ref Range Status  10/12/2023 0.95 0.76 - 1.27 mg/dL Final         Passed - eGFR is 30 or above and within 360 days    GFR calc Af Amer  Date Value Ref Range Status  05/25/2020 107 >59 mL/min/1.73 Final    Comment:    **[facility] currently reports eGFR  in compliance with the current**   recommendations of the [employer]. [facility] will   update reporting as new guidelines are published from the NKF-ASN   Task force.    GFR, Estimated  Date Value Ref Range Status  04/18/2023 >60 >60 mL/min Final    Comment:    (NOTE) Calculated using the CKD-EPI Creatinine Equation (2021)    eGFR  Date Value Ref Range Status  10/12/2023 86 >59 mL/min/1.73 Final         Passed - Completed PHQ-2 or PHQ-9 in the last 360 days      Passed - Last BP in normal range    BP Readings from Last 1 Encounters:  10/12/23 116/76          rosuvastatin (CRESTOR) 40 MG tablet [Pharmacy Med Name: Rosuvastatin  Calcium Oral Tablet 40 MG] 90 tablet 3    Sig: TAKE 1 TABLET EVERY DAY     Cardiovascular:  Antilipid - Statins 2 Failed - 12/29/2023  4:29 PM      Failed - Valid encounter within last 12 months    Recent Outpatient Visits   None     Future Appointments             In 2 weeks Larae Grooms, NP [facility] [facility]            Failed - Lipid Panel in normal range within the last 12 months    Cholesterol, Total  Date Value Ref Range Status  10/12/2023 160 100 - 199 mg/dL Final   Cholesterol Piccolo, Waived  Date Value Ref Range Status  03/23/2017 145 <200 mg/dL Final    Comment:                            Desirable                <200                         Borderline High      200- 239                         High                     >239    LDL Chol Calc (NIH)  Date Value Ref Range Status  10/12/2023 82 0 - 99 mg/dL Final   HDL  Date Value Ref Range Status  10/12/2023 57 >39 mg/dL Final   Triglycerides  Date Value Ref Range Status  10/12/2023 116 0 - 149 mg/dL Final   Triglycerides Piccolo,Waived  Date Value Ref Range Status  03/23/2017 74 <150 mg/dL Final    Comment:                            Normal                   <150                         Borderline High     150 - 199                         High  200 - 499                         Very High                >499          Passed - Cr in normal range and within 360 days    Creatinine, Ser  Date Value Ref Range Status  10/12/2023 0.95 0.76 - 1.27 mg/dL Final         Passed - Patient is not pregnant

## 2024-01-13 ENCOUNTER — Ambulatory Visit (INDEPENDENT_AMBULATORY_CARE_PROVIDER_SITE_OTHER): Payer: Self-pay | Admitting: Nurse Practitioner

## 2024-01-13 ENCOUNTER — Encounter: Payer: Self-pay | Admitting: Nurse Practitioner

## 2024-01-13 VITALS — BP 108/68 | HR 69 | Temp 97.9°F | Resp 15 | Ht 70.24 in | Wt 190.8 lb

## 2024-01-13 DIAGNOSIS — Z1211 Encounter for screening for malignant neoplasm of colon: Secondary | ICD-10-CM

## 2024-01-13 DIAGNOSIS — E119 Type 2 diabetes mellitus without complications: Secondary | ICD-10-CM

## 2024-01-13 DIAGNOSIS — E1149 Type 2 diabetes mellitus with other diabetic neurological complication: Secondary | ICD-10-CM | POA: Diagnosis not present

## 2024-01-13 DIAGNOSIS — I10 Essential (primary) hypertension: Secondary | ICD-10-CM | POA: Diagnosis not present

## 2024-01-13 DIAGNOSIS — Z7985 Long-term (current) use of injectable non-insulin antidiabetic drugs: Secondary | ICD-10-CM

## 2024-01-13 DIAGNOSIS — E785 Hyperlipidemia, unspecified: Secondary | ICD-10-CM

## 2024-01-13 NOTE — Assessment & Plan Note (Signed)
 Chronic.  Controlled.  Continue with current medication regimen of Crestor 40mg  daily.  Refills sent today.  Labs ordered today.  Return to clinic in 3 months for reevaluation.  Call sooner if concerns arise.

## 2024-01-13 NOTE — Assessment & Plan Note (Signed)
 Chronic.  Controlled.  Continue with current medication regimen on Amlodipine 5mg  and Benzapril 20mg  daily.  Labs ordered today.  Return to clinic in 3 months for reevaluation.  Call sooner if concerns arise.

## 2024-01-13 NOTE — Progress Notes (Signed)
 BP 108/68 (BP Location: Right Arm, Patient Position: Sitting, Cuff Size: Normal)   Pulse 69   Temp 97.9 F (36.6 C) (Oral)   Resp 15   Ht 5' 10.24" (1.784 m)   Wt 190 lb 12.8 oz (86.5 kg)   SpO2 98%   BMI 27.19 kg/m    Subjective:    Patient ID: Mark Allen, male    DOB: Dec 02, 1950, 73 y.o.   MRN: [id number]  HPI: Mark Allen is a 73 y.o. male  Chief Complaint  Patient presents with   Diabetes    Checks at various times during the day. Fasting glucose in am is generally 130/150.    Hyperlipidemia   Hypertension    No recent checks.    DIABETES Ozempic 0.5mg  weekly, metformin 1000mg  BID, Jardiance 25mg .  Hypoglycemic episodes:no Polydipsia/polyuria: increased urination Visual disturbance: no Chest pain: no Paresthesias: yes Glucose Monitoring: yes  Accucheck frequency: Daily  Fasting glucose: 135-150  Post prandial:  Evening:  Before meals: Taking Insulin?:   Long acting insulin:   Short acting insulin: Blood Pressure Monitoring: not checking Retinal Examination: up to date Foot Exam: Up to Date Diabetic Education: Not Completed Pneumovax: Up to Date Influenza: Not up to Date Aspirin: no  HYPERTENSION / HYPERLIPIDEMIA Satisfied with current treatment? no Duration of hypertension: years BP monitoring frequency: not checking BP range:  BP medication side effects: no Past BP meds: amlodipine and benazepril Duration of hyperlipidemia: years Cholesterol medication side effects: no Cholesterol supplements: none Past cholesterol medications: rosuvastatin (crestor) Medication compliance: excellent compliance Aspirin: no Recent stressors: no Recurrent headaches: no Visual changes: no Palpitations: no Dyspnea: no Chest pain: no Lower extremity edema: no Dizzy/lightheaded: no  Relevant past medical, surgical, family and social history reviewed and updated as indicated. Interim medical history since our last visit reviewed. Allergies and medications  reviewed and updated.  Review of Systems  Eyes:  Negative for visual disturbance.  Respiratory:  Negative for chest tightness and shortness of breath.   Cardiovascular:  Negative for chest pain, palpitations and leg swelling.  Endocrine: Negative for polydipsia and polyuria.  Neurological:  Negative for dizziness, light-headedness, numbness and headaches.    Per HPI unless specifically indicated above     Objective:    BP 108/68 (BP Location: Right Arm, Patient Position: Sitting, Cuff Size: Normal)   Pulse 69   Temp 97.9 F (36.6 C) (Oral)   Resp 15   Ht 5' 10.24" (1.784 m)   Wt 190 lb 12.8 oz (86.5 kg)   SpO2 98%   BMI 27.19 kg/m   Wt Readings from Last 3 Encounters:  01/13/24 190 lb 12.8 oz (86.5 kg)  10/12/23 194 lb (88 kg)  09/14/23 202 lb 3.2 oz (91.7 kg)    Physical Exam Vitals and nursing note reviewed.  Constitutional:      General: He is not in acute distress.    Appearance: Normal appearance. He is not ill-appearing, toxic-appearing or diaphoretic.  HENT:     Head: Normocephalic.     Right Ear: External ear normal.     Left Ear: External ear normal.     Nose: Nose normal. No congestion or rhinorrhea.     Mouth/Throat:     Mouth: Mucous membranes are moist.  Eyes:     General:        Right eye: No discharge.        Left eye: No discharge.     Extraocular Movements: Extraocular movements intact.  Conjunctiva/sclera: Conjunctivae normal.     Pupils: Pupils are equal, round, and reactive to light.  Cardiovascular:     Rate and Rhythm: Normal rate and regular rhythm.     Heart sounds: No murmur heard. Pulmonary:     Effort: Pulmonary effort is normal. No respiratory distress.     Breath sounds: Normal breath sounds. No wheezing, rhonchi or rales.  Abdominal:     General: Abdomen is flat. Bowel sounds are normal.  Musculoskeletal:     Cervical back: Normal range of motion and neck supple.  Skin:    General: Skin is warm and dry.     Capillary  Refill: Capillary refill takes less than 2 seconds.  Neurological:     General: No focal deficit present.     Mental Status: He is alert and oriented to person, place, and time.  Psychiatric:        Mood and Affect: Mood normal.        Behavior: Behavior normal.        Thought Content: Thought content normal.        Judgment: Judgment normal.     Results for orders placed or performed in visit on 10/12/23  Urinalysis, Routine w reflex microscopic   Collection Time: 10/12/23  9:14 AM  Result Value Ref Range   Specific Gravity, UA 1.020 1.005 - 1.030   pH, UA 5.5 5.0 - 7.5   Color, UA Yellow Yellow   Appearance Ur Clear Clear   Leukocytes,UA Negative Negative   Protein,UA Negative Negative/Trace   Glucose, UA 2+ (A) Negative   Ketones, UA 1+ (A) Negative   RBC, UA Negative Negative   Bilirubin, UA Negative Negative   Urobilinogen, Ur 0.2 0.2 - 1.0 mg/dL   Nitrite, UA Negative Negative   Microscopic Examination Comment   Hemoglobin A1c   Collection Time: 10/12/23  9:15 AM  Result Value Ref Range   Hgb A1c MFr Bld 7.7 (H) 4.8 - 5.6 %   Est. average glucose Bld gHb Est-mCnc 174 mg/dL  Z61   Collection Time: 10/12/23  9:15 AM  Result Value Ref Range   Vitamin B-12 597 232 - 1,245 pg/mL  TSH   Collection Time: 10/12/23  9:15 AM  Result Value Ref Range   TSH 1.760 0.450 - 4.500 uIU/mL  PSA   Collection Time: 10/12/23  9:15 AM  Result Value Ref Range   Prostate Specific Ag, Serum 0.3 0.0 - 4.0 ng/mL  Lipid panel   Collection Time: 10/12/23  9:15 AM  Result Value Ref Range   Cholesterol, Total 160 100 - 199 mg/dL   Triglycerides 096 0 - 149 mg/dL   HDL 57 >04 mg/dL   VLDL Cholesterol Cal 21 5 - 40 mg/dL   LDL Chol Calc (NIH) 82 0 - 99 mg/dL   Chol/HDL Ratio 2.8 0.0 - 5.0 ratio  CBC with Differential/Platelet   Collection Time: 10/12/23  9:15 AM  Result Value Ref Range   WBC 7.2 3.4 - 10.8 x10E3/uL   RBC 4.95 4.14 - 5.80 x10E6/uL   Hemoglobin 15.6 13.0 - 17.7 g/dL    Hematocrit 54.0 98.1 - 51.0 %   MCV 96 79 - 97 fL   MCH 31.5 26.6 - 33.0 pg   MCHC 32.7 31.5 - 35.7 g/dL   RDW 19.1 47.8 - 29.5 %   Platelets 372 150 - 450 x10E3/uL   Neutrophils 62 Not Estab. %   Lymphs 25 Not Estab. %   Monocytes 10  Not Estab. %   Eos 2 Not Estab. %   Basos 1 Not Estab. %   Neutrophils Absolute 4.5 1.4 - 7.0 x10E3/uL   Lymphocytes Absolute 1.8 0.7 - 3.1 x10E3/uL   Monocytes Absolute 0.7 0.1 - 0.9 x10E3/uL   EOS (ABSOLUTE) 0.1 0.0 - 0.4 x10E3/uL   Basophils Absolute 0.0 0.0 - 0.2 x10E3/uL   Immature Granulocytes 0 Not Estab. %   Immature Grans (Abs) 0.0 0.0 - 0.1 x10E3/uL  Comprehensive metabolic panel   Collection Time: 10/12/23  9:15 AM  Result Value Ref Range   Glucose 162 (H) 70 - 99 mg/dL   BUN 21 8 - 27 mg/dL   Creatinine, Ser 1.61 0.76 - 1.27 mg/dL   eGFR 86 >09 UE/AVW/0.98   BUN/Creatinine Ratio 22 10 - 24   Sodium 138 134 - 144 mmol/L   Potassium 4.7 3.5 - 5.2 mmol/L   Chloride 100 96 - 106 mmol/L   CO2 22 20 - 29 mmol/L   Calcium 9.5 8.6 - 10.2 mg/dL   Total Protein 7.4 6.0 - 8.5 g/dL   Albumin 4.5 3.9 - 4.9 g/dL   Globulin, Total 2.9 1.5 - 4.5 g/dL   Bilirubin Total 0.3 0.0 - 1.2 mg/dL   Alkaline Phosphatase 76 44 - 121 IU/L   AST 15 0 - 40 IU/L   ALT 18 0 - 44 IU/L      Assessment & Plan:   Problem List Items Addressed This Visit       Cardiovascular and Mediastinum   Essential hypertension   Chronic.  Controlled.  Continue with current medication regimen on Amlodipine 5mg  and Benzapril 20mg  daily.  Labs ordered today.  Return to clinic in 3 months for reevaluation.  Call sooner if concerns arise.         Endocrine   Type II diabetes mellitus with neurological manifestations (HCC) - Primary   Chronic, Controlled but has been trending up. Most recent A1c was 7.8- recheck today for monitoring, Results to dictate further management  Currently taking Ozempic 0.5 mg weekly injection, Metformin 1000 mg PO BID, Jardiance 25 mgPO every day   Follow up in 3 months or sooner if concerns arise       Relevant Orders   Comprehensive metabolic panel with GFR   Diabetes mellitus treated with injections of non-insulin medication (HCC)   Relevant Orders   Hemoglobin A1c     Other   Hyperlipidemia   Chronic.  Controlled.  Continue with current medication regimen of Crestor 40mg  daily.  Refills sent today.  Labs ordered today.  Return to clinic in 3 months for reevaluation.  Call sooner if concerns arise.       Relevant Orders   Lipid panel   Other Visit Diagnoses       Screening for colon cancer       Relevant Orders   Amb Referral to Colonoscopy        Follow up plan: Return in about 3 months (around 04/13/2024) for HTN, HLD, DM2 FU.

## 2024-01-13 NOTE — Assessment & Plan Note (Signed)
 Chronic, Controlled but has been trending up. Most recent A1c was 7.8- recheck today for monitoring, Results to dictate further management  Currently taking Ozempic 0.5 mg weekly injection, Metformin 1000 mg PO BID, Jardiance 25 mgPO every day  Follow up in 3 months or sooner if concerns arise

## 2024-01-14 ENCOUNTER — Encounter: Payer: Self-pay | Admitting: Nurse Practitioner

## 2024-01-14 LAB — LIPID PANEL
Chol/HDL Ratio: 2.7 ratio (ref 0.0–5.0)
Cholesterol, Total: 145 mg/dL (ref 100–199)
HDL: 53 mg/dL (ref >39)
LDL Chol Calc (NIH): 76 mg/dL (ref 0–99)
Triglycerides: 85 mg/dL (ref 0–149)
VLDL Cholesterol Cal: 16 mg/dL (ref 5–40)

## 2024-01-14 LAB — COMPREHENSIVE METABOLIC PANEL WITH GFR
ALT: 18 IU/L (ref 0–44)
AST: 15 IU/L (ref 0–40)
Albumin: 4.4 g/dL (ref 3.8–4.8)
Alkaline Phosphatase: 74 IU/L (ref 44–121)
BUN/Creatinine Ratio: 28 — ABNORMAL HIGH (ref 10–24)
BUN: 19 mg/dL (ref 8–27)
Bilirubin Total: 0.2 mg/dL (ref 0.0–1.2)
CO2: 24 mmol/L (ref 20–29)
Calcium: 9.5 mg/dL (ref 8.6–10.2)
Chloride: 103 mmol/L (ref 96–106)
Creatinine, Ser: 0.67 mg/dL — ABNORMAL LOW (ref 0.76–1.27)
Globulin, Total: 2.5 g/dL (ref 1.5–4.5)
Glucose: 137 mg/dL — ABNORMAL HIGH (ref 70–99)
Potassium: 4.9 mmol/L (ref 3.5–5.2)
Sodium: 140 mmol/L (ref 134–144)
Total Protein: 6.9 g/dL (ref 6.0–8.5)
eGFR: 100 mL/min/{1.73_m2} (ref >59)

## 2024-01-14 LAB — HEMOGLOBIN A1C
Est. average glucose Bld gHb Est-mCnc: 160 mg/dL
Hgb A1c MFr Bld: 7.2 % — ABNORMAL HIGH (ref 4.8–5.6)

## 2024-01-26 ENCOUNTER — Encounter: Payer: Self-pay | Admitting: *Deleted

## 2024-04-18 ENCOUNTER — Ambulatory Visit (INDEPENDENT_AMBULATORY_CARE_PROVIDER_SITE_OTHER): Admitting: Nurse Practitioner

## 2024-04-18 VITALS — BP 129/74 | HR 76 | Temp 98.4°F | Wt 181.4 lb

## 2024-04-18 DIAGNOSIS — I10 Essential (primary) hypertension: Secondary | ICD-10-CM

## 2024-04-18 DIAGNOSIS — G4700 Insomnia, unspecified: Secondary | ICD-10-CM

## 2024-04-18 DIAGNOSIS — E1149 Type 2 diabetes mellitus with other diabetic neurological complication: Secondary | ICD-10-CM | POA: Diagnosis not present

## 2024-04-18 DIAGNOSIS — E785 Hyperlipidemia, unspecified: Secondary | ICD-10-CM | POA: Diagnosis not present

## 2024-04-18 DIAGNOSIS — E538 Deficiency of other specified B group vitamins: Secondary | ICD-10-CM

## 2024-04-18 DIAGNOSIS — E119 Type 2 diabetes mellitus without complications: Secondary | ICD-10-CM | POA: Diagnosis not present

## 2024-04-18 DIAGNOSIS — Z7985 Long-term (current) use of injectable non-insulin antidiabetic drugs: Secondary | ICD-10-CM

## 2024-04-18 MED ORDER — TRAZODONE HCL 50 MG PO TABS: 50.0000 mg | ORAL_TABLET | Freq: Every evening | ORAL | 1 refills | Status: DC | PRN

## 2024-04-18 MED ORDER — EMPAGLIFLOZIN 25 MG PO TABS: 25.0000 mg | ORAL_TABLET | Freq: Every day | ORAL | 1 refills | Status: DC

## 2024-04-18 MED ORDER — OZEMPIC (0.25 OR 0.5 MG/DOSE) 2 MG/3ML ~~LOC~~ SOPN: 0.5000 mg | PEN_INJECTOR | SUBCUTANEOUS | 2 refills | Status: DC

## 2024-04-18 MED ORDER — DULOXETINE HCL 60 MG PO CPEP: 60.0000 mg | ORAL_CAPSULE | Freq: Every day | ORAL | 1 refills | Status: DC

## 2024-04-18 MED ORDER — ROSUVASTATIN CALCIUM 40 MG PO TABS: 40.0000 mg | ORAL_TABLET | Freq: Every day | ORAL | 1 refills | Status: DC

## 2024-04-18 MED ORDER — BENAZEPRIL HCL 20 MG PO TABS: 20.0000 mg | ORAL_TABLET | Freq: Every day | ORAL | 1 refills | Status: DC

## 2024-04-18 MED ORDER — AMLODIPINE BESYLATE 5 MG PO TABS: 5.0000 mg | ORAL_TABLET | Freq: Every day | ORAL | 1 refills | Status: DC

## 2024-04-18 MED ORDER — METFORMIN HCL 1000 MG PO TABS: 1000.0000 mg | ORAL_TABLET | Freq: Two times a day (BID) | ORAL | 1 refills | Status: DC

## 2024-04-18 NOTE — Assessment & Plan Note (Signed)
 Labs ordered at visit today.  Will make recommendations based on lab results.

## 2024-04-18 NOTE — Assessment & Plan Note (Signed)
Chronic.  Controlled.  Continue with current medication regimen of Trazodone.  Labs ordered today.  Return to clinic in 6 months for reevaluation.  Call sooner if concerns arise.

## 2024-04-18 NOTE — Assessment & Plan Note (Signed)
 Chronic, Controlled but has been trending up. Most recent A1c was 7.2%- recheck today for monitoring, Results to dictate further management  Currently taking Ozempic  0.5 mg weekly injection, Metformin  1000 mg PO BID, Jardiance  25 mgPO every day. Doing well with current regimen.  Follow up in 6 months or sooner if concerns arise

## 2024-04-18 NOTE — Assessment & Plan Note (Signed)
Chronic.  Controlled.  Continue with current medication regimen of Crestor 40mg  daily.  Refills sent today.  Labs ordered today.  Return to clinic in 6 months for reevaluation.  Call sooner if concerns arise.

## 2024-04-18 NOTE — Progress Notes (Signed)
 BP 129/74   Pulse 76   Temp 98.4 F (36.9 C) (Oral)   Wt 181 lb 6.4 oz (82.3 kg)   SpO2 96%   BMI 25.85 kg/m    Subjective:    Patient ID: Mark Allen, male    DOB: [birth year], 73 y.o.   MRN: [id number]  HPI: Mark Allen is a 73 y.o. male  Chief Complaint  Patient presents with   Diabetes   DIABETES Ozempic  0.5mg  weekly, metformin  1000mg  BID, Jardiance  25mg .  Hypoglycemic episodes:no Polydipsia/polyuria: increased urination Visual disturbance: no Chest pain: no Paresthesias: yes Glucose Monitoring: yes  Accucheck frequency: Daily  Fasting glucose: 130  Post prandial:  Evening:  Before meals: Taking Insulin ?:   Long acting insulin :   Short acting insulin : Blood Pressure Monitoring: not checking Retinal Examination: up to date Foot Exam: Up to Date Diabetic Education: Not Completed Pneumovax: Up to Date Influenza: Not up to Date Aspirin: no  HYPERTENSION / HYPERLIPIDEMIA Satisfied with current treatment? no Duration of hypertension: years BP monitoring frequency: not checking BP range:  BP medication side effects: no Past BP meds: amlodipine  and benazepril  Duration of hyperlipidemia: years Cholesterol medication side effects: no Cholesterol supplements: none Past cholesterol medications: rosuvastatin  (crestor ) Medication compliance: excellent compliance Aspirin: no Recent stressors: no Recurrent headaches: no Visual changes: no Palpitations: no Dyspnea: no Chest pain: no Lower extremity edema: no Dizzy/lightheaded: no  Relevant past medical, surgical, family and social history reviewed and updated as indicated. Interim medical history since our last visit reviewed. Allergies and medications reviewed and updated.  Review of Systems  Eyes:  Negative for visual disturbance.  Respiratory:  Negative for chest tightness and shortness of breath.   Cardiovascular:  Negative for chest pain, palpitations and leg swelling.  Endocrine: Negative for  polydipsia and polyuria.  Neurological:  Negative for dizziness, light-headedness, numbness and headaches.    Per HPI unless specifically indicated above     Objective:    BP 129/74   Pulse 76   Temp 98.4 F (36.9 C) (Oral)   Wt 181 lb 6.4 oz (82.3 kg)   SpO2 96%   BMI 25.85 kg/m   Wt Readings from Last 3 Encounters:  04/18/24 181 lb 6.4 oz (82.3 kg)  01/13/24 190 lb 12.8 oz (86.5 kg)  10/12/23 194 lb (88 kg)    Physical Exam Vitals and nursing note reviewed.  Constitutional:      General: He is not in acute distress.    Appearance: Normal appearance. He is not ill-appearing, toxic-appearing or diaphoretic.  HENT:     Head: Normocephalic.     Right Ear: External ear normal.     Left Ear: External ear normal.     Nose: Nose normal. No congestion or rhinorrhea.     Mouth/Throat:     Mouth: Mucous membranes are moist.  Eyes:     General:        Right eye: No discharge.        Left eye: No discharge.     Extraocular Movements: Extraocular movements intact.     Conjunctiva/sclera: Conjunctivae normal.     Pupils: Pupils are equal, round, and reactive to light.  Cardiovascular:     Rate and Rhythm: Normal rate and regular rhythm.     Heart sounds: No murmur heard. Pulmonary:     Effort: Pulmonary effort is normal. No respiratory distress.     Breath sounds: Normal breath sounds. No wheezing, rhonchi or rales.  Abdominal:  General: Abdomen is flat. Bowel sounds are normal.  Musculoskeletal:     Cervical back: Normal range of motion and neck supple.  Skin:    General: Skin is warm and dry.     Capillary Refill: Capillary refill takes less than 2 seconds.  Neurological:     General: No focal deficit present.     Mental Status: He is alert and oriented to person, place, and time.  Psychiatric:        Mood and Affect: Mood normal.        Behavior: Behavior normal.        Thought Content: Thought content normal.        Judgment: Judgment normal.     Results for  orders placed or performed in visit on 01/13/24  Comprehensive metabolic panel with GFR   Collection Time: 01/13/24  8:33 AM  Result Value Ref Range   Glucose 137 (H) 70 - 99 mg/dL   BUN 19 8 - 27 mg/dL   Creatinine, Ser 9.32 (L) 0.76 - 1.27 mg/dL   eGFR 899 >40 fO/fpw/8.26   BUN/Creatinine Ratio 28 (H) 10 - 24   Sodium 140 134 - 144 mmol/L   Potassium 4.9 3.5 - 5.2 mmol/L   Chloride 103 96 - 106 mmol/L   CO2 24 20 - 29 mmol/L   Calcium  9.5 8.6 - 10.2 mg/dL   Total Protein 6.9 6.0 - 8.5 g/dL   Albumin 4.4 3.8 - 4.8 g/dL   Globulin, Total 2.5 1.5 - 4.5 g/dL   Bilirubin Total 0.2 0.0 - 1.2 mg/dL   Alkaline Phosphatase 74 44 - 121 IU/L   AST 15 0 - 40 IU/L   ALT 18 0 - 44 IU/L  Hemoglobin A1c   Collection Time: 01/13/24  8:33 AM  Result Value Ref Range   Hgb A1c MFr Bld 7.2 (H) 4.8 - 5.6 %   Est. average glucose Bld gHb Est-mCnc 160 mg/dL  Lipid panel   Collection Time: 01/13/24  8:33 AM  Result Value Ref Range   Cholesterol, Total 145 100 - 199 mg/dL   Triglycerides 85 0 - 149 mg/dL   HDL 53 >60 mg/dL   VLDL Cholesterol Cal 16 5 - 40 mg/dL   LDL Chol Calc (NIH) 76 0 - 99 mg/dL   Chol/HDL Ratio 2.7 0.0 - 5.0 ratio      Assessment & Plan:   Problem List Items Addressed This Visit       Cardiovascular and Mediastinum   Essential hypertension   Chronic.  Controlled.  Continue with current medication regimen on Amlodipine  5mg  and Benzapril 20mg  daily.  Labs ordered today.  Return to clinic in 6 months for reevaluation.  Call sooner if concerns arise.       Relevant Medications   amLODipine  (NORVASC ) 5 MG tablet   benazepril  (LOTENSIN ) 20 MG tablet   rosuvastatin  (CRESTOR ) 40 MG tablet     Endocrine   Type II diabetes mellitus with neurological manifestations (HCC) - Primary   Chronic, Controlled but has been trending up. Most recent A1c was 7.2%- recheck today for monitoring, Results to dictate further management  Currently taking Ozempic  0.5 mg weekly injection,  Metformin  1000 mg PO BID, Jardiance  25 mgPO every day. Doing well with current regimen.  Follow up in 6 months or sooner if concerns arise       Relevant Medications   benazepril  (LOTENSIN ) 20 MG tablet   empagliflozin  (JARDIANCE ) 25 MG TABS tablet   metFORMIN  (GLUCOPHAGE )  1000 MG tablet   rosuvastatin  (CRESTOR ) 40 MG tablet   Semaglutide ,0.25 or 0.5MG /DOS, (OZEMPIC , 0.25 OR 0.5 MG/DOSE,) 2 MG/3ML SOPN   Other Relevant Orders   Comprehensive metabolic panel with GFR   Diabetes mellitus treated with injections of non-insulin  medication (HCC)   Relevant Medications   benazepril  (LOTENSIN ) 20 MG tablet   empagliflozin  (JARDIANCE ) 25 MG TABS tablet   metFORMIN  (GLUCOPHAGE ) 1000 MG tablet   rosuvastatin  (CRESTOR ) 40 MG tablet   Semaglutide ,0.25 or 0.5MG /DOS, (OZEMPIC , 0.25 OR 0.5 MG/DOSE,) 2 MG/3ML SOPN   Other Relevant Orders   Hemoglobin A1c   Lipid panel     Other   Hyperlipidemia   Chronic.  Controlled.  Continue with current medication regimen of Crestor  40mg  daily.  Refills sent today.  Labs ordered today.  Return to clinic in 6 months for reevaluation.  Call sooner if concerns arise.       Relevant Medications   amLODipine  (NORVASC ) 5 MG tablet   benazepril  (LOTENSIN ) 20 MG tablet   rosuvastatin  (CRESTOR ) 40 MG tablet   Other Relevant Orders   Lipid panel   Insomnia   Chronic.  Controlled.  Continue with current medication regimen of Trazodone .  Labs ordered today.  Return to clinic in 6 months for reevaluation.  Call sooner if concerns arise.        Relevant Medications   traZODone  (DESYREL ) 50 MG tablet   Vitamin B 12 deficiency   Labs ordered at visit today.  Will make recommendations based on lab results.        Relevant Orders   B12     Follow up plan: Return in about 6 months (around 10/19/2024) for Physical and Fasting labs.

## 2024-04-18 NOTE — Assessment & Plan Note (Signed)
 Chronic.  Controlled.  Continue with current medication regimen on Amlodipine  5mg  and Benzapril 20mg  daily.  Labs ordered today.  Return to clinic in 6 months for reevaluation.  Call sooner if concerns arise.

## 2024-04-19 ENCOUNTER — Ambulatory Visit: Payer: Self-pay | Admitting: Nurse Practitioner

## 2024-04-19 LAB — COMPREHENSIVE METABOLIC PANEL WITH GFR
ALT: 15 IU/L (ref 0–44)
AST: 14 IU/L (ref 0–40)
Albumin: 4.6 g/dL (ref 3.8–4.8)
Alkaline Phosphatase: 84 IU/L (ref 44–121)
BUN/Creatinine Ratio: 31 — ABNORMAL HIGH (ref 10–24)
BUN: 23 mg/dL (ref 8–27)
Bilirubin Total: 0.5 mg/dL (ref 0.0–1.2)
CO2: 18 mmol/L — ABNORMAL LOW (ref 20–29)
Calcium: 9.7 mg/dL (ref 8.6–10.2)
Chloride: 100 mmol/L (ref 96–106)
Creatinine, Ser: 0.74 mg/dL — ABNORMAL LOW (ref 0.76–1.27)
Globulin, Total: 3 g/dL (ref 1.5–4.5)
Glucose: 144 mg/dL — ABNORMAL HIGH (ref 70–99)
Potassium: 4.8 mmol/L (ref 3.5–5.2)
Sodium: 136 mmol/L (ref 134–144)
Total Protein: 7.6 g/dL (ref 6.0–8.5)
eGFR: 97 mL/min/1.73 (ref >59)

## 2024-04-19 LAB — HEMOGLOBIN A1C
Est. average glucose Bld gHb Est-mCnc: 154 mg/dL
Hgb A1c MFr Bld: 7 % — ABNORMAL HIGH (ref 4.8–5.6)

## 2024-04-19 LAB — LIPID PANEL
Chol/HDL Ratio: 2.5 ratio (ref 0.0–5.0)
Cholesterol, Total: 157 mg/dL (ref 100–199)
HDL: 62 mg/dL (ref >39)
LDL Chol Calc (NIH): 78 mg/dL (ref 0–99)
Triglycerides: 92 mg/dL (ref 0–149)
VLDL Cholesterol Cal: 17 mg/dL (ref 5–40)

## 2024-04-19 LAB — VITAMIN B12: Vitamin B-12: 499 pg/mL (ref 232–1245)

## 2024-05-22 ENCOUNTER — Other Ambulatory Visit: Payer: Self-pay

## 2024-05-22 ENCOUNTER — Emergency Department
Admission: EM | Admit: 2024-05-22 | Discharge: 2024-05-22 | Disposition: A | Discharge status: Home / Self Care | Attending: Emergency Medicine | Admitting: Emergency Medicine

## 2024-05-22 ENCOUNTER — Emergency Department

## 2024-05-22 DIAGNOSIS — R531 Weakness: Secondary | ICD-10-CM | POA: Diagnosis not present

## 2024-05-22 DIAGNOSIS — B349 Viral infection, unspecified: Secondary | ICD-10-CM | POA: Insufficient documentation

## 2024-05-22 DIAGNOSIS — E119 Type 2 diabetes mellitus without complications: Secondary | ICD-10-CM | POA: Diagnosis not present

## 2024-05-22 DIAGNOSIS — I700 Atherosclerosis of aorta: Secondary | ICD-10-CM | POA: Diagnosis not present

## 2024-05-22 DIAGNOSIS — R824 Acetonuria: Secondary | ICD-10-CM | POA: Insufficient documentation

## 2024-05-22 DIAGNOSIS — I10 Essential (primary) hypertension: Secondary | ICD-10-CM | POA: Insufficient documentation

## 2024-05-22 DIAGNOSIS — R509 Fever, unspecified: Secondary | ICD-10-CM | POA: Diagnosis not present

## 2024-05-22 LAB — COMPREHENSIVE METABOLIC PANEL WITH GFR
ALT: 24 U/L (ref 0–44)
AST: 24 U/L (ref 15–41)
Albumin: 3.3 g/dL — ABNORMAL LOW (ref 3.5–5.0)
Alkaline Phosphatase: 63 U/L (ref 38–126)
Anion gap: 11 (ref 5–15)
BUN: 16 mg/dL (ref 8–23)
CO2: 21 mmol/L — ABNORMAL LOW (ref 22–32)
Calcium: 8.8 mg/dL — ABNORMAL LOW (ref 8.9–10.3)
Chloride: 97 mmol/L — ABNORMAL LOW (ref 98–111)
Creatinine, Ser: 0.65 mg/dL (ref 0.61–1.24)
GFR, Estimated: >60 mL/min (ref >60)
Glucose, Bld: 238 mg/dL — ABNORMAL HIGH (ref 70–99)
Potassium: 3.6 mmol/L (ref 3.5–5.1)
Sodium: 129 mmol/L — ABNORMAL LOW (ref 135–145)
Total Bilirubin: 0.7 mg/dL (ref 0.0–1.2)
Total Protein: 7.6 g/dL (ref 6.5–8.1)

## 2024-05-22 LAB — CBC
HCT: 44.3 % (ref 39.0–52.0)
Hemoglobin: 15.2 g/dL (ref 13.0–17.0)
MCH: 31.1 pg (ref 26.0–34.0)
MCHC: 34.3 g/dL (ref 30.0–36.0)
MCV: 90.6 fL (ref 80.0–100.0)
Platelets: 295 K/uL (ref 150–400)
RBC: 4.89 MIL/uL (ref 4.22–5.81)
RDW: 12.4 % (ref 11.5–15.5)
WBC: 9.9 K/uL (ref 4.0–10.5)
nRBC: 0 % (ref 0.0–0.2)

## 2024-05-22 LAB — URINALYSIS, ROUTINE W REFLEX MICROSCOPIC
Bacteria, UA: NONE SEEN
Bilirubin Urine: NEGATIVE
Glucose, UA: >=500 mg/dL — AB
Ketones, ur: 5 mg/dL — AB
Leukocytes,Ua: NEGATIVE
Nitrite: NEGATIVE
Protein, ur: NEGATIVE mg/dL
Specific Gravity, Urine: 1.027 (ref 1.005–1.030)
Squamous Epithelial / HPF: 0 /HPF (ref 0–5)
pH: 5 (ref 5.0–8.0)

## 2024-05-22 LAB — RESP PANEL BY RT-PCR (RSV, FLU A&B, COVID)  RVPGX2
Influenza A by PCR: NEGATIVE
Influenza B by PCR: NEGATIVE
Resp Syncytial Virus by PCR: NEGATIVE
SARS Coronavirus 2 by RT PCR: NEGATIVE

## 2024-05-22 LAB — CBG MONITORING, ED: Glucose-Capillary: 240 mg/dL — ABNORMAL HIGH (ref 70–99)

## 2024-05-22 MED ORDER — LACTATED RINGERS IV BOLUS
1000.0000 mL | Freq: Once | INTRAVENOUS | Status: AC
  Administered 2024-05-22: 1000 mL via INTRAVENOUS

## 2024-05-22 MED ORDER — ACETAMINOPHEN 500 MG PO TABS
1000.0000 mg | ORAL_TABLET | Freq: Once | ORAL | Status: AC
  Administered 2024-05-22: 1000 mg via ORAL
  Filled 2024-05-22: qty 2

## 2024-05-22 NOTE — ED Provider Notes (Signed)
 [facility] Provider Note    Event Date/Time   First MD Initiated Contact with Patient 05/22/24 [phone]     (approximate)   History   Weakness   HPI  Mark Allen is a 73 y.o. male who presents to the ED for evaluation of Weakness   Reviewed PCP visit from last month.  History of HTN, DM, HLD  Patient presents for evaluation of 4-5 days of generalized sensation of fatigue, fevers at home, chills and just not feeling good.   Physical Exam   Triage Vital Signs: ED Triage Vitals  Encounter Vitals Group     BP 05/22/24 0917 116/80     Girls Systolic BP Percentile --      Girls Diastolic BP Percentile --      Boys Systolic BP Percentile --      Boys Diastolic BP Percentile --      Pulse Rate 05/22/24 0917 (!) 102     Resp 05/22/24 0917 18     Temp 05/22/24 0917 98.7 F (37.1 C)     Temp Source 05/22/24 0917 Oral     SpO2 05/22/24 0917 96 %     Weight 05/22/24 0919 176 lb (79.8 kg)     Height 05/22/24 0919 5' 10 (1.778 m)     Head Circumference --      Peak Flow --      Pain Score 05/22/24 0918 0     Pain Loc --      Pain Education --      Exclude from Growth Chart --     Most recent vital signs: Vitals:   05/22/24 1030 05/22/24 1312  BP: 121/77   Pulse: 85   Resp: 19   Temp:  98.6 F (37 C)  SpO2: 92%     General: Awake, no distress.  CV:  Good peripheral perfusion.  Resp:  Normal effort.  Abd:  No distention.  MSK:  No deformity noted.  Neuro:  No focal deficits appreciated. Other:     ED Results / Procedures / Treatments   Labs (all labs ordered are listed, but only abnormal results are displayed) Labs Reviewed  COMPREHENSIVE METABOLIC PANEL WITH GFR - Abnormal; Notable for the following components:      Result Value   Sodium 129 (*)    Chloride 97 (*)    CO2 21 (*)    Glucose, Bld 238 (*)    Calcium  8.8 (*)    Albumin 3.3 (*)    All other components within normal limits  URINALYSIS, ROUTINE W REFLEX  MICROSCOPIC - Abnormal; Notable for the following components:   Color, Urine YELLOW (*)    APPearance CLEAR (*)    Glucose, UA >=500 (*)    Hgb urine dipstick SMALL (*)    Ketones, ur 5 (*)    All other components within normal limits  CBG MONITORING, ED - Abnormal; Notable for the following components:   Glucose-Capillary 240 (*)    All other components within normal limits  RESP PANEL BY RT-PCR (RSV, FLU A&B, COVID)  [id number]  CBC    EKG Sinus tachycardia with a rate of 103 bpm.  Normal axis and intervals.  No clear signs of acute ischemia.  1 PVC.  RADIOLOGY CXR interpreted by me without evidence of acute cardiopulmonary pathology.  Official radiology report(s): DG Chest Portable 1 View Result Date: 05/22/2024 EXAM: 1 VIEW XRAY OF THE CHEST 05/22/2024 09:54:00 AM COMPARISON: None available. CLINICAL HISTORY:  Fever, weakness. Eval infiltrate. Pt arrives via POV with c/o weakness and feeling sick since Wednesday. Per support person, pt has been running a fever, had chills, weakness on and off. Pt has had hiccups constantly for 5 days. Per support person pt has been running a fever of 102 over the last several days. FINDINGS: LUNGS AND PLEURA: No focal pulmonary opacity. No pulmonary edema. No pleural effusion. No pneumothorax. HEART AND MEDIASTINUM: No acute abnormality of the cardiac and mediastinal silhouettes. Aortic atherosclerosis (icd10-i70.0). BONES AND SOFT TISSUES: No acute osseous abnormality. IMPRESSION: 1. No acute process. Electronically signed by: Katheleen Faes MD 05/22/2024 10:31 AM EDT RP Workstation: [id number]    PROCEDURES and INTERVENTIONS:  .1-3 Lead EKG Interpretation  Performed by: Claudene Rover, MD Authorized by: Claudene Rover, MD     Interpretation: normal     ECG rate:  80   ECG rate assessment: normal     Rhythm: sinus rhythm     Ectopy: none     Conduction: normal     Medications  acetaminophen  (TYLENOL ) tablet 1,000 mg (1,000 mg Oral Given 05/22/24  1059)  lactated ringers  bolus 1,000 mL (0 mLs Intravenous Stopped 05/22/24 1309)     IMPRESSION / MDM / ASSESSMENT AND PLAN / ED COURSE  I reviewed the triage vital signs and the nursing notes.  Differential diagnosis includes, but is not limited to, viral syndrome, sepsis, pneumonia, UTI, COVID-19, DKA or symptomatic hyperglycemia, HHS  {Patient presents with symptoms of an acute illness or injury that is potentially life-threatening.  Patient presents with a few days of vague fatigue, chills and fevers at home, with a benign workup and possibly related to a viral syndrome, suitable for trial of outpatient management.  Look systemically well to me.  No focal features on exam and has reassuring vital signs.  Mild metabolic derangements with hyperglycemia, hyponatremia but no acidosis or more severe features.  UA with small ketones suggestive of dehydration but no infectious features.  Normal CBC.  Negative viral swabs, clear CXR.  Ambulating without hypoxia or distress.  I considered admission for this patient and discussed this with him on multiple occasions but he is comfortable going home and we discussed close return precautions.  Clinical Course as of 05/22/24 1316  Sun May 22, 2024  1210 Reassessed and discussed generally unrevealing workup.  He reports feeling better with the fluids and Tylenol .  We discussed likely viral syndrome and possible outpatient management.  He is agreeable. [DS]  1313 Reassessed after ambulation trial.  Reports feeling good and wanted to leave.  Discussed close return precautions. [DS]    Clinical Course User Index [DS] Claudene Rover, MD     FINAL CLINICAL IMPRESSION(S) / ED DIAGNOSES   Final diagnoses:  Generalized weakness  Viral syndrome     Rx / DC Orders   ED Discharge Orders     None        Note:  This document was prepared using Dragon voice recognition software and may include unintentional dictation errors.   Claudene Rover,  MD 05/22/24 [phone]

## 2024-05-22 NOTE — Discharge Instructions (Signed)
Use Tylenol for pain and fevers.  Up to 1000 mg per dose, up to 4 times per day.  Do not take more than 4000 mg of Tylenol/acetaminophen within 24 hours..  

## 2024-05-22 NOTE — ED Notes (Addendum)
 Patient ambulated in hallway with pulse oximetry remaining above 95% at room air. Patient was obviously off balance stumbling 2x but was able to regain balance without staff assistance. But denies any dizziness, lightheaded.

## 2024-05-22 NOTE — ED Triage Notes (Signed)
 Pt arrives via POV with c/o weakness and feeling sick since Wednesday. Per support person, pt has been running a fever, had chills, weakness on and off. Pt has had hiccups constantly for 5 days. Per support person pt has been running a fever of 102 over the last several days. Pt is A&Ox4 and ambulatory during triage.

## 2024-05-23 ENCOUNTER — Ambulatory Visit (INDEPENDENT_AMBULATORY_CARE_PROVIDER_SITE_OTHER): Admitting: Nurse Practitioner

## 2024-05-23 ENCOUNTER — Encounter: Payer: Self-pay | Admitting: Nurse Practitioner

## 2024-05-23 VITALS — BP 138/72 | HR 82 | Temp 98.2°F | Resp 15 | Ht 70.0 in | Wt 182.0 lb

## 2024-05-23 DIAGNOSIS — R509 Fever, unspecified: Secondary | ICD-10-CM

## 2024-05-23 MED ORDER — DOXYCYCLINE HYCLATE 100 MG PO TABS: 100.0000 mg | ORAL_TABLET | Freq: Two times a day (BID) | ORAL | 0 refills | Status: DC

## 2024-05-23 NOTE — Progress Notes (Signed)
 BP 138/72 (BP Location: Left Arm, Patient Position: Sitting, Cuff Size: Normal)   Pulse 82   Temp 98.2 F (36.8 C) (Oral)   Resp 15   Ht 5' 10 (1.778 m)   Wt 182 lb (82.6 kg)   SpO2 92%   BMI 26.11 kg/m    Subjective:    Patient ID: Mark Allen, male    DOB: 08-Jun-1951, 73 y.o.   MRN: [id number]  HPI: Mark Allen is a 73 y.o. male  Chief Complaint  Patient presents with   Hospitalization Follow-up    ED for a few days worth of weakness. Started last Wednesday. Hiccups as well off and on through out this. Whole body aches, fever starts again when tylenol  wears off, goes as high as 102.    Patient presents to clinic with complaints of symptoms that started last Wednesday.  Fatigue, hiccups, temp of 102 (using tylenol ), he is having cold shaking, hot flashes, coughing, coughing up phlegm (started last night),   Denies congestion, ear pain, sore throat, vomiting or diarrhea.     Relevant past medical, surgical, family and social history reviewed and updated as indicated. Interim medical history since our last visit reviewed. Allergies and medications reviewed and updated.  Review of Systems  Constitutional:  Positive for fatigue and fever.  HENT:  Negative for congestion, ear pain and sore throat.   Respiratory:  Positive for cough.   Gastrointestinal:  Negative for diarrhea and vomiting.    Per HPI unless specifically indicated above     Objective:    BP 138/72 (BP Location: Left Arm, Patient Position: Sitting, Cuff Size: Normal)   Pulse 82   Temp 98.2 F (36.8 C) (Oral)   Resp 15   Ht 5' 10 (1.778 m)   Wt 182 lb (82.6 kg)   SpO2 92%   BMI 26.11 kg/m   Wt Readings from Last 3 Encounters:  05/23/24 182 lb (82.6 kg)  05/22/24 176 lb (79.8 kg)  04/18/24 181 lb 6.4 oz (82.3 kg)    Physical Exam Vitals and nursing note reviewed.  Constitutional:      General: He is not in acute distress.    Appearance: Normal appearance. He is not ill-appearing,  toxic-appearing or diaphoretic.  HENT:     Head: Normocephalic.     Right Ear: Tympanic membrane and external ear normal.     Left Ear: Tympanic membrane and external ear normal.     Nose: Nose normal. No congestion or rhinorrhea.     Mouth/Throat:     Mouth: Mucous membranes are moist.     Pharynx: No oropharyngeal exudate or posterior oropharyngeal erythema.  Eyes:     General:        Right eye: No discharge.        Left eye: No discharge.     Extraocular Movements: Extraocular movements intact.     Conjunctiva/sclera: Conjunctivae normal.     Pupils: Pupils are equal, round, and reactive to light.  Cardiovascular:     Rate and Rhythm: Normal rate and regular rhythm.     Heart sounds: No murmur heard. Pulmonary:     Effort: Pulmonary effort is normal. No respiratory distress.     Breath sounds: Normal breath sounds. No wheezing, rhonchi or rales.  Abdominal:     General: Abdomen is flat. Bowel sounds are normal.  Musculoskeletal:     Cervical back: Normal range of motion and neck supple.  Skin:    General: Skin  is warm and dry.     Capillary Refill: Capillary refill takes less than 2 seconds.  Neurological:     General: No focal deficit present.     Mental Status: He is alert and oriented to person, place, and time.  Psychiatric:        Mood and Affect: Mood normal.        Behavior: Behavior normal.        Thought Content: Thought content normal.        Judgment: Judgment normal.     Results for orders placed or performed during the hospital encounter of 05/22/24  CBG monitoring, ED   Collection Time: 05/22/24  9:47 AM  Result Value Ref Range   Glucose-Capillary 240 (H) 70 - 99 mg/dL  Resp panel by RT-PCR (RSV, Flu A&B, Covid) Anterior Nasal Swab   Collection Time: 05/22/24  9:50 AM   Specimen: Anterior Nasal Swab  Result Value Ref Range   SARS Coronavirus 2 by RT PCR NEGATIVE NEGATIVE   Influenza A by PCR NEGATIVE NEGATIVE   Influenza B by PCR NEGATIVE NEGATIVE    Resp Syncytial Virus by PCR NEGATIVE NEGATIVE  Comprehensive metabolic panel   Collection Time: 05/22/24  9:50 AM  Result Value Ref Range   Sodium 129 (L) 135 - 145 mmol/L   Potassium 3.6 3.5 - 5.1 mmol/L   Chloride 97 (L) 98 - 111 mmol/L   CO2 21 (L) 22 - 32 mmol/L   Glucose, Bld 238 (H) 70 - 99 mg/dL   BUN 16 8 - 23 mg/dL   Creatinine, Ser 9.34 0.61 - 1.24 mg/dL   Calcium  8.8 (L) 8.9 - 10.3 mg/dL   Total Protein 7.6 6.5 - 8.1 g/dL   Albumin 3.3 (L) 3.5 - 5.0 g/dL   AST 24 15 - 41 U/L   ALT 24 0 - 44 U/L   Alkaline Phosphatase 63 38 - 126 U/L   Total Bilirubin 0.7 0.0 - 1.2 mg/dL   GFR, Estimated >39 >39 mL/min   Anion gap 11 5 - 15  CBC   Collection Time: 05/22/24  9:50 AM  Result Value Ref Range   WBC 9.9 4.0 - 10.5 K/uL   RBC 4.89 4.22 - 5.81 MIL/uL   Hemoglobin 15.2 13.0 - 17.0 g/dL   HCT 55.6 60.9 - 47.9 %   MCV 90.6 80.0 - 100.0 fL   MCH 31.1 26.0 - 34.0 pg   MCHC 34.3 30.0 - 36.0 g/dL   RDW 87.5 88.4 - 84.4 %   Platelets 295 150 - 400 K/uL   nRBC 0.0 0.0 - 0.2 %  Urinalysis, Routine w reflex microscopic -Urine, Clean Catch   Collection Time: 05/22/24  9:50 AM  Result Value Ref Range   Color, Urine YELLOW (A) YELLOW   APPearance CLEAR (A) CLEAR   Specific Gravity, Urine 1.027 1.005 - 1.030   pH 5.0 5.0 - 8.0   Glucose, UA >=500 (A) NEGATIVE mg/dL   Hgb urine dipstick SMALL (A) NEGATIVE   Bilirubin Urine NEGATIVE NEGATIVE   Ketones, ur 5 (A) NEGATIVE mg/dL   Protein, ur NEGATIVE NEGATIVE mg/dL   Nitrite NEGATIVE NEGATIVE   Leukocytes,Ua NEGATIVE NEGATIVE   RBC / HPF 0-5 0 - 5 RBC/hpf   WBC, UA 0-5 0 - 5 WBC/hpf   Bacteria, UA NONE SEEN NONE SEEN   Squamous Epithelial / HPF 0 0 - 5 /HPF   Mucus PRESENT       Assessment & Plan:  Problem List Items Addressed This Visit   None Visit Diagnoses       Fever, unspecified fever cause    -  Primary   Suspect tick borne illness. Will checke labs at visit today. Flu/COVID/RSV neg. WIll start Doxycycline . May  need to extend if labs are positive.   Relevant Orders   Lyme Disease Serology w/Reflex   Comp Met (CMET)   Spotted Fever Group Antibodies        Follow up plan: No follow-ups on file.

## 2024-05-24 LAB — COMPREHENSIVE METABOLIC PANEL WITH GFR
ALT: 29 IU/L (ref 0–44)
AST: 21 IU/L (ref 0–40)
Albumin: 4 g/dL (ref 3.8–4.8)
Alkaline Phosphatase: 90 IU/L (ref 44–121)
BUN/Creatinine Ratio: 17 (ref 10–24)
BUN: 13 mg/dL (ref 8–27)
Bilirubin Total: 0.3 mg/dL (ref 0.0–1.2)
CO2: 23 mmol/L (ref 20–29)
Calcium: 9.3 mg/dL (ref 8.6–10.2)
Chloride: 94 mmol/L — ABNORMAL LOW (ref 96–106)
Creatinine, Ser: 0.75 mg/dL — ABNORMAL LOW (ref 0.76–1.27)
Globulin, Total: 3.1 g/dL (ref 1.5–4.5)
Glucose: 120 mg/dL — ABNORMAL HIGH (ref 70–99)
Potassium: 4.5 mmol/L (ref 3.5–5.2)
Sodium: 134 mmol/L (ref 134–144)
Total Protein: 7.1 g/dL (ref 6.0–8.5)
eGFR: 96 mL/min/1.73 (ref >59)

## 2024-05-24 LAB — LYME DISEASE SEROLOGY W/REFLEX: Lyme Total Antibody EIA: NEGATIVE

## 2024-05-25 ENCOUNTER — Ambulatory Visit: Payer: Self-pay | Admitting: Nurse Practitioner

## 2024-05-26 LAB — SPOTTED FEVER GROUP ANTIBODIES
Spotted Fever Group IgG: <1:64 {titer}
Spotted Fever Group IgM: 1:128 {titer} — AB

## 2024-05-26 MED ORDER — DOXYCYCLINE HYCLATE 100 MG PO TABS: 100.0000 mg | ORAL_TABLET | Freq: Two times a day (BID) | ORAL | 0 refills | Status: AC

## 2024-06-02 ENCOUNTER — Telehealth: Payer: Self-pay | Admitting: Nurse Practitioner

## 2024-06-02 NOTE — Telephone Encounter (Signed)
 Copied from CRM [id number]. Topic: Clinical - Medication Refill >> Jun 02, 2024 10:18 AM Wess RAMAN wrote: Medication: doxycycline  (VIBRA -TABS) 100 MG tablet   Has the patient contacted their pharmacy? No (Agent: If no, request that the patient contact the pharmacy for the refill. If patient does not wish to contact the pharmacy document the reason why and proceed with request.) (Agent: If yes, when and what did the pharmacy advise?)  This is the patient's preferred pharmacy:  [id number] - [place], [place] - [address] [place] [address] Phone: [phone] Fax: [phone]  Is this the correct pharmacy for this prescription? Yes If no, delete pharmacy and type the correct one.   Has the prescription been filled recently? Yes  Is the patient out of the medication? Yes  Has the patient been seen for an appointment in the last year OR does the patient have an upcoming appointment? Yes  Can we respond through MyChart? Yes  Agent: Please be advised that Rx refills may take up to 3 business days. We ask that you follow-up with your pharmacy.

## 2024-06-02 NOTE — Telephone Encounter (Signed)
 Called and LVM asking for patient to please return my call.   On chart review, medication was sent in 05/26/24 for a 18 day supply. Patient should not need a refill.   OK for E2C2 to speak to patient and find out why he is requesting a refill for this medication as he should not be out.

## 2024-07-05 NOTE — Progress Notes (Signed)
 Mark Allen                                          MRN: [id number]   07/05/2024   The [facility] Quality Team Specialist reviewed this patient medical record for the purposes of chart review for care gap closure. The following were reviewed: chart review for care gap closure-kidney health evaluation for diabetes:eGFR  and uACR.    [facility] Quality Team

## 2024-07-06 DIAGNOSIS — Z961 Presence of intraocular lens: Secondary | ICD-10-CM | POA: Diagnosis not present

## 2024-07-06 DIAGNOSIS — H52223 Regular astigmatism, bilateral: Secondary | ICD-10-CM | POA: Diagnosis not present

## 2024-07-06 DIAGNOSIS — E119 Type 2 diabetes mellitus without complications: Secondary | ICD-10-CM | POA: Diagnosis not present

## 2024-07-07 LAB — OPHTHALMOLOGY REPORT-SCANNED

## 2024-07-08 ENCOUNTER — Encounter: Payer: Self-pay | Admitting: Family Medicine

## 2024-07-24 DIAGNOSIS — A770 Spotted fever due to Rickettsia rickettsii: Secondary | ICD-10-CM | POA: Insufficient documentation

## 2024-07-24 NOTE — Patient Instructions (Signed)
Tick Bite Information, Adult  Ticks are insects that can bite. Most ticks live in bushes and grassy areas. They climb onto people and animals that go by. Then they bite. Some ticks carry germs that can make you sick. How can I prevent tick bites? Take these steps: Before you go outside: Wear long sleeves and long pants. Wear light-colored clothes. Tuck your pant legs into your socks. Use an insect repellent that has 20% or higher of the ingredients DEET, picaridin, or IR3535. Follow the instructions on the label. Put it on: Bare skin. Avoid your eyes and mouth areas. The tops of your boots. Your pant legs. The ends of your sleeves. If you use an insect repellent that has the ingredient permethrin, follow the instructions on the label. Do not put permethrin on the skin. Put it on: Clothing. Shoes. Outdoor gear. Tents. When you are outside Avoid walking through long grass. Stay in the middle of the trail. Do not touch the bushes. Check for ticks on your clothes, hair, and skin often while you are outside. Check again before you go inside. When you go indoors Check your clothes for ticks. Dry your clothes in a dryer on high heat for 10 minutes or more. If clothes are damp, more time may be needed. Wash your clothes right away if they need to be washed. Use hot water. Check your pets and outdoor gear. Shower right away. Check your body for ticks. Do a full body check using a mirror. Check your clothes, skin, head, neck, armpits, waist, groin, and joint areas. What is the right way to remove a tick? Remove the tick from your skin as soon as possible. Do not remove the tick with your bare fingers. Do not try to remove a tick with heat, alcohol, petroleum jelly, or fingernail polish. To remove a tick that is crawling on your skin: Go outside and brush the tick off. Use tape or a lint roller. To remove a tick that is biting: Wash your hands. If you have gloves, put them on. Use tweezers,  curved forceps, or a tick-removal tool to grasp the tick. Grasp the tick as close to your skin and as close to the tick's head as possible. Gently pull up until the tick lets go. Try to keep the tick's head attached to its body. Do not twist or jerk the tick. Do not squeeze or crush the tick. What should I do after taking out a tick? Clean the bite area and your hands with soap and water, rubbing alcohol, or an iodine wash. If you have an antiseptic cream or ointment, put a small amount on the bite area. Wash and disinfect any instruments that you used to remove the tick. How should I get rid of a live tick? To get rid of a live tick, use one of these methods: Place the tick in rubbing alcohol. Place it in a bag or container you can close tightly. Throw it away. Wrap it tightly in tape. Throw it away. Flush it down the toilet. Where to find more information Centers for Disease Control and Prevention: cdc.gov/ticks U.[place]. Environmental Protection Agency: epa.gov/insect-repellents Contact a doctor if: You have a fever or chills. You have a red rash that makes a circle (bull's-eye rash) in the bite area. You have redness and swelling where the tick bit you. You have a headache or stiff neck. You have pain in a muscle, joint, or bone. You are more tired than normal. You have trouble walking or   moving your legs. You have numbness in your legs. You have tender or swollen lymph glands. You have belly (abdominal) pain, vomiting, watery poop (diarrhea), or weight loss. Get help right away if: You cannot remove a tick. You cannot move (have paralysis) or feel weak. You are feeling worse or have new symptoms. You find a tick that is biting you and filled with blood, especially if you are in an area where diseases from ticks are common. Summary Ticks may carry germs that can make you sick. To prevent tick bites wear long sleeves, long pants, and light colors. Use insect repellent. Follow the  instructions on the label. If the tick is biting, remove it right away. Do not try to remove it with heat, alcohol, petroleum jelly, or fingernail polish. Contact a doctor if you have symptoms of a disease after being bitten by a tick. This information is not intended to replace advice given to you by your health care provider. Make sure you discuss any questions you have with your health care provider. Document Revised: 12/16/2021 Document Reviewed: 12/16/2021 Elsevier Patient Education  2024 Elsevier Inc.  

## 2024-07-25 ENCOUNTER — Encounter: Payer: Self-pay | Admitting: Nurse Practitioner

## 2024-07-25 ENCOUNTER — Ambulatory Visit (INDEPENDENT_AMBULATORY_CARE_PROVIDER_SITE_OTHER): Admitting: Nurse Practitioner

## 2024-07-25 VITALS — BP 131/71 | HR 66 | Temp 97.8°F | Resp 18 | Wt 182.0 lb

## 2024-07-25 DIAGNOSIS — A770 Spotted fever due to Rickettsia rickettsii: Secondary | ICD-10-CM

## 2024-07-25 NOTE — Progress Notes (Signed)
 BP 131/71 (BP Location: Left Arm, Patient Position: Sitting, Cuff Size: Normal)   Pulse 66   Temp 97.8 F (36.6 C) (Oral)   Resp 18   Wt 182 lb (82.6 kg)   SpO2 99%   BMI 26.11 kg/m    Subjective:    Patient ID: Mark Allen, male    DOB: 1951-04-27, 73 y.o.   MRN: [id number]  HPI: Mark Allen is a 73 y.o. male  Chief Complaint  Patient presents with   Follow-up    Overall doing well. However did have an episode last wednesday of not feeling well and tired but then overnight passed out but did not hurt himself. Wife helped him up and been feeling better since.    [place] SPOTTED FEVER Was treated on 05/25/24 with Doxycycline .  Completed full 28 days.  Has been feeling good.  Had episode last Wednesday of not feeling well, got tired. Passed out, but did not hurt himself. Thinks it was bad Chinese food. Felt bad for 24 hours and then was better. Duration: month Onset: 05/25/24 Itching: no Status: better Treatments attempted: as above Fever: maybe recently after eating bad food, slight Chills: maybe recently after eating bad food, slight headache: no Muscle pain: no Rash: no   Relevant past medical, surgical, family and social history reviewed and updated as indicated. Interim medical history since our last visit reviewed. Allergies and medications reviewed and updated.  Review of Systems  Constitutional:  Negative for activity change, appetite change, diaphoresis, fatigue and fever.  Respiratory:  Negative for cough, chest tightness, shortness of breath and wheezing.   Cardiovascular:  Negative for chest pain, palpitations and leg swelling.  Gastrointestinal: Negative.   Neurological: Negative.   Psychiatric/Behavioral: Negative.     Per HPI unless specifically indicated above     Objective:    BP 131/71 (BP Location: Left Arm, Patient Position: Sitting, Cuff Size: Normal)   Pulse 66   Temp 97.8 F (36.6 C) (Oral)   Resp 18   Wt 182 lb (82.6 kg)    SpO2 99%   BMI 26.11 kg/m   Wt Readings from Last 3 Encounters:  07/25/24 182 lb (82.6 kg)  05/23/24 182 lb (82.6 kg)  05/22/24 176 lb (79.8 kg)    Physical Exam Vitals and nursing note reviewed.  Constitutional:      General: He is awake. He is not in acute distress.    Appearance: He is well-developed and well-groomed. He is not ill-appearing or toxic-appearing.  HENT:     Head: Normocephalic.     Right Ear: Hearing and external ear normal.     Left Ear: Hearing and external ear normal.  Eyes:     General: Lids are normal.     Extraocular Movements: Extraocular movements intact.     Conjunctiva/sclera: Conjunctivae normal.  Neck:     Thyroid : No thyromegaly.     Vascular: No carotid bruit.  Cardiovascular:     Rate and Rhythm: Normal rate and regular rhythm.     Heart sounds: Normal heart sounds. No murmur heard.    No gallop.  Pulmonary:     Effort: No accessory muscle usage or respiratory distress.     Breath sounds: Normal breath sounds.  Abdominal:     General: Bowel sounds are normal. There is no distension.     Palpations: Abdomen is soft.     Tenderness: There is no abdominal tenderness.  Musculoskeletal:     Cervical back: Full  passive range of motion without pain.     Right lower leg: No edema.     Left lower leg: No edema.  Lymphadenopathy:     Cervical: No cervical adenopathy.  Skin:    General: Skin is warm.     Capillary Refill: Capillary refill takes less than 2 seconds.  Neurological:     Mental Status: He is alert and oriented to person, place, and time.     Deep Tendon Reflexes: Reflexes are normal and symmetric.     Reflex Scores:      Brachioradialis reflexes are 2+ on the right side and 2+ on the left side.      Patellar reflexes are 2+ on the right side and 2+ on the left side. Psychiatric:        Attention and Perception: Attention normal.        Mood and Affect: Mood normal.        Speech: Speech normal.        Behavior: Behavior normal.  Behavior is cooperative.        Thought Content: Thought content normal.     Results for orders placed or performed in visit on 07/08/24  OPHTHALMOLOGY REPORT-SCANNED   Collection Time: 07/07/24 10:03 AM  Result Value Ref Range   HM Diabetic Eye Exam No Retinopathy No Retinopathy      Assessment & Plan:   Problem List Items Addressed This Visit       Other   [facility] spotted fever - Primary   Acute and treated 05/25/24, did completed full treatment and is feeling better. Check labs today per request. Suspect improvement will be present, with more elevation on IgG vs IgM.      Relevant Orders   Spotted Fever Group Antibodies     Follow up plan: Return if symptoms worsen or fail to improve.

## 2024-07-25 NOTE — Assessment & Plan Note (Signed)
 Acute and treated 05/25/24, did completed full treatment and is feeling better. Check labs today per request. Suspect improvement will be present, with more elevation on IgG vs IgM.

## 2024-07-28 ENCOUNTER — Ambulatory Visit: Payer: Self-pay | Admitting: Nurse Practitioner

## 2024-07-28 LAB — SPOTTED FEVER GROUP ANTIBODIES
Spotted Fever Group IgG: <1:64 {titer}
Spotted Fever Group IgM: 1:128 {titer} — AB

## 2024-09-16 ENCOUNTER — Telehealth: Payer: Self-pay

## 2024-09-16 NOTE — Progress Notes (Signed)
" ° °  09/16/2024  Patient ID: Mark Allen, male   DOB: 1951/06/29, 73 y.o.   MRN: [id number]  This patient is appearing on a report for being at risk of failing the adherence measure for cholesterol (statin) medications this calendar year.   Medication: rosuvastatin  40mg  Last fill date: 04/18/24 for 90 day supply  Contacted pharmacy to facilitate refills.  [name], PharmD, DPLA  "

## 2024-10-20 ENCOUNTER — Ambulatory Visit: Admitting: Nurse Practitioner

## 2024-10-20 ENCOUNTER — Encounter: Payer: Self-pay | Admitting: Nurse Practitioner

## 2024-10-20 VITALS — BP 124/75 | HR 67 | Temp 98.0°F | Ht 70.28 in | Wt 192.6 lb

## 2024-10-20 DIAGNOSIS — E1149 Type 2 diabetes mellitus with other diabetic neurological complication: Secondary | ICD-10-CM

## 2024-10-20 DIAGNOSIS — Z0000 Encounter for general adult medical examination without abnormal findings: Secondary | ICD-10-CM

## 2024-10-20 DIAGNOSIS — E538 Deficiency of other specified B group vitamins: Secondary | ICD-10-CM

## 2024-10-20 DIAGNOSIS — Z23 Encounter for immunization: Secondary | ICD-10-CM

## 2024-10-20 DIAGNOSIS — E785 Hyperlipidemia, unspecified: Secondary | ICD-10-CM | POA: Diagnosis not present

## 2024-10-20 DIAGNOSIS — Z1211 Encounter for screening for malignant neoplasm of colon: Secondary | ICD-10-CM | POA: Diagnosis not present

## 2024-10-20 DIAGNOSIS — I10 Essential (primary) hypertension: Secondary | ICD-10-CM | POA: Diagnosis not present

## 2024-10-20 DIAGNOSIS — Z7985 Long-term (current) use of injectable non-insulin antidiabetic drugs: Secondary | ICD-10-CM | POA: Diagnosis not present

## 2024-10-20 DIAGNOSIS — G4700 Insomnia, unspecified: Secondary | ICD-10-CM | POA: Diagnosis not present

## 2024-10-20 DIAGNOSIS — E119 Type 2 diabetes mellitus without complications: Secondary | ICD-10-CM

## 2024-10-20 LAB — MICROALBUMIN, URINE WAIVED
Creatinine, Urine Waived: 50 mg/dL (ref 10–300)
Microalb, Ur Waived: 30 mg/L — ABNORMAL HIGH (ref 0–19)

## 2024-10-20 MED ORDER — BENAZEPRIL HCL 20 MG PO TABS: 20.0000 mg | ORAL_TABLET | Freq: Every day | ORAL | 1 refills | Status: AC

## 2024-10-20 MED ORDER — AMLODIPINE BESYLATE 5 MG PO TABS: 5.0000 mg | ORAL_TABLET | Freq: Every day | ORAL | 1 refills | Status: AC

## 2024-10-20 MED ORDER — DULOXETINE HCL 60 MG PO CPEP: 60.0000 mg | ORAL_CAPSULE | Freq: Every day | ORAL | 1 refills | Status: AC

## 2024-10-20 MED ORDER — ROSUVASTATIN CALCIUM 40 MG PO TABS: 40.0000 mg | ORAL_TABLET | Freq: Every day | ORAL | 1 refills | Status: AC

## 2024-10-20 MED ORDER — TRAZODONE HCL 50 MG PO TABS: 50.0000 mg | ORAL_TABLET | Freq: Every evening | ORAL | 1 refills | Status: AC | PRN

## 2024-10-20 MED ORDER — EMPAGLIFLOZIN 25 MG PO TABS: 25.0000 mg | ORAL_TABLET | Freq: Every day | ORAL | 1 refills | Status: AC

## 2024-10-20 MED ORDER — METFORMIN HCL 1000 MG PO TABS: 1000.0000 mg | ORAL_TABLET | Freq: Two times a day (BID) | ORAL | 1 refills | Status: AC

## 2024-10-20 MED ORDER — SEMAGLUTIDE (1 MG/DOSE) 4 MG/3ML ~~LOC~~ SOPN: 1.0000 mg | PEN_INJECTOR | SUBCUTANEOUS | 1 refills | Status: AC

## 2024-10-20 NOTE — Assessment & Plan Note (Signed)
 Labs ordered at visit today.  Will make recommendations based on lab results.

## 2024-10-20 NOTE — Assessment & Plan Note (Signed)
Chronic.  Controlled.  Continue with current medication regimen of Crestor 40mg  daily.  Refills sent today.  Labs ordered today.  Return to clinic in 6 months for reevaluation.  Call sooner if concerns arise.

## 2024-10-20 NOTE — Assessment & Plan Note (Signed)
Chronic.  Controlled.  Continue with current medication regimen of Trazodone.  Labs ordered today.  Return to clinic in 6 months for reevaluation.  Call sooner if concerns arise.

## 2024-10-20 NOTE — Progress Notes (Signed)
 "  Chief Complaint  Patient presents with   Medicare Wellness    6 month F/u     Subjective:   Mark Allen is a 74 y.o. male who presents for a Medicare Annual Wellness Visit.  Fall Screening Falls in the past year?: 0 Number of falls in past year: 0 Was there an injury with Fall?: 0 Fall Risk Category Calculator: 0 Patient Fall Risk Level: Low Fall Risk  Fall Risk Patient at Risk for Falls Due to: No Fall Risks Fall risk Follow up: Falls evaluation completed  Advance Directives (For Healthcare) Does Patient Have a Medical Advance Directive?: Yes Type of Advance Directive: Healthcare Power of Margaret; Living will    Allergies (verified) Sulfa antibiotics and Sulfacetamide sodium   Current Medications (verified) Outpatient Encounter Medications as of 10/20/2024  Medication Sig   amLODipine  (NORVASC ) 5 MG tablet Take 1 tablet (5 mg total) by mouth daily.   benazepril  (LOTENSIN ) 20 MG tablet Take 1 tablet (20 mg total) by mouth daily.   Blood Glucose Calibration (TRUE METRIX LEVEL 3) High SOLN    Blood Glucose Monitoring Suppl (TRUE METRIX METER) w/Device KIT USE AS DIRECTED   cyanocobalamin  1000 MCG tablet Take 1,000 mcg by mouth daily.   DULoxetine  (CYMBALTA ) 60 MG capsule Take 1 capsule (60 mg total) by mouth daily.   empagliflozin  (JARDIANCE ) 25 MG TABS tablet Take 1 tablet (25 mg total) by mouth daily.   fluticasone (FLONASE) 50 MCG/ACT nasal spray Place 2 sprays into both nostrils daily.   glucose blood (TRUE METRIX BLOOD GLUCOSE TEST) test strip TEST BLOOD SUGAR TWICE DAILY USE AS INSTRUCTED   metFORMIN  (GLUCOPHAGE ) 1000 MG tablet Take 1 tablet (1,000 mg total) by mouth 2 (two) times daily with a meal.   rosuvastatin  (CRESTOR ) 40 MG tablet Take 1 tablet (40 mg total) by mouth daily.   Semaglutide ,0.25 or 0.5MG /DOS, (OZEMPIC , 0.25 OR 0.5 MG/DOSE,) 2 MG/3ML SOPN Inject 0.5 mg into the skin once a week.   traZODone  (DESYREL ) 50 MG tablet Take 1 tablet (50 mg total) by  mouth at bedtime as needed. for sleep   TRUEplus Lancets 28G MISC 1 each by Other route in the morning and at bedtime.   No facility-administered encounter medications on file as of 10/20/2024.    History: Past Medical History:  Diagnosis Date   Allergy Sulfur   Broken leg 06/2018   Left leg   Calculus of kidney    Cancer (HCC) skin   Diabetes mellitus without complication (HCC) 07/30/2021   Hyperlipidemia    Hypertension    Impotence, organic    Insomnia    Need for shingles vaccine 07/30/2021   Testicular hypofunction    Type II diabetes mellitus with neurological manifestations (HCC)    Ulcer    Past Surgical History:  Procedure Laterality Date   COLONOSCOPY WITH PROPOFOL  N/A 10/22/2018   Procedure: COLONOSCOPY WITH PROPOFOL ;  Surgeon: Therisa Bi, MD;  Location: [facility] ENDOSCOPY;  Service: Gastroenterology;  Laterality: N/A;   EYE SURGERY  cataract   NASAL SINUS SURGERY  09/29/2002   REFRACTIVE SURGERY Bilateral    SPLENECTOMY  09/29/1961   TONSILLECTOMY     Family History  Problem Relation Age of Onset   Cancer Father    Social History   Occupational History   Not on file  Tobacco Use   Smoking status: Former    Current packs/day: 0.00    Types: Cigarettes    Quit date: 01/27/1989    Years since  quitting: 35.7   Smokeless tobacco: Former    Types: Chew    Quit date: 09/29/1990  Vaping Use   Vaping status: Never Used  Substance and Sexual Activity   Alcohol use: Yes    Alcohol/week: 3.0 standard drinks of alcohol    Types: 3 Cans of beer per week    Comment: rare,none last 24hrs   Drug use: No   Sexual activity: Yes    Comment: It has slowed down due to ED.   Tobacco Counseling Counseling given: Not Answered  SDOH Screenings   Food Insecurity: Patient Declined (10/19/2024)  Housing: Low Risk (10/19/2024)  Transportation Needs: No Transportation Needs (10/19/2024)  Utilities: Not At Risk (11/04/2022)  Alcohol Screen: Low Risk (10/19/2024)  Depression  (PHQ2-9): Low Risk (04/18/2024)  Financial Resource Strain: Patient Declined (10/19/2024)  Physical Activity: Insufficiently Active (10/19/2024)  Social Connections: Moderately Isolated (10/19/2024)  Stress: No Stress Concern Present (10/19/2024)  Tobacco Use: Medium Risk (10/20/2024)   See flowsheets for full screening details  Depression Screen PHQ 2 & 9 Depression Scale- Over the past 2 weeks, how often have you been bothered by any of the following problems? Little interest or pleasure in doing things: 0 Feeling down, depressed, or hopeless (PHQ Adolescent also includes...irritable): 0 PHQ-2 Total Score: 0 Trouble falling or staying asleep, or sleeping too much: 0 Feeling tired or having little energy: 0 Poor appetite or overeating (PHQ Adolescent also includes...weight loss): 0 Feeling bad about yourself - or that you are a failure or have let yourself or your family down: 0 Trouble concentrating on things, such as reading the newspaper or watching television (PHQ Adolescent also includes...like school work): 0 Moving or speaking so slowly that other people could have noticed. Or the opposite - being so fidgety or restless that you have been moving around a lot more than usual: 0 Thoughts that you would be better off dead, or of hurting yourself in some way: 0 PHQ-9 Total Score: 0 If you checked off any problems, how difficult have these problems made it for you to do your work, take care of things at home, or get along with other people?: Not difficult at all     Goals Addressed               This Visit's Progress     Physically be in shape (pt-stated)        Evidence-based guidance:  Assess patient perspective on healthy weight, weight loss, motivation, and readiness for weight loss; review current dietary intake and exercise levels.  Review without judgment previous weight-loss attempts; acknowledge the challenge patient is facing.  Discuss psychologic factors related to  previous attempts to lose weight (both successes and failures); identify setbacks as opportunities for growth.  Acknowledge understanding of weight-related stigma, embarrassment; assess and address effect on self-esteem and quality of life.  Discuss barriers, including negative body image, self-efficacy, embarrassment, physical impairments, pain, lack of motivation, competing demands, financial constraints.  Acknowledge and validate that changes in lifestyle and diet may influence social interactions including exclusion from groups and contribute to relapse or discontinuing treatment.  Brainstorm ways to minimize isolation.  Provide anticipatory guidance about benefits of weight loss, including improvement in vitality, self-esteem, sexual health, pain, and mobility, even with small amount of weight loss.  Use motivational interviewing techniques to increase confidence and commitment to change.  Assist patient to set mutual, meaningful goals regarding weight, activity, exercise, and healthy lifestyle; identify change strategies that align with level of  readiness for change.   Notes:              Objective:    Today's Vitals   10/20/24 0803  BP: 124/75  Pulse: 67  Temp: 98 F (36.7 C)  TempSrc: Oral  SpO2: 93%  Weight: 192 lb 9.6 oz (87.4 kg)  Height: 5' 10.28 (1.785 m)  PainSc: 0-No pain   Body mass index is 27.42 kg/m.  Hearing/Vision screen No results found. Immunizations and Health Maintenance Health Maintenance  Topic Date Due   COVID-19 Vaccine (3 - Pfizer risk series) 01/30/2020   Colonoscopy  10/23/2023   Influenza Vaccine  04/29/2024   Diabetic kidney evaluation - Urine ACR  09/13/2024   FOOT EXAM  10/11/2024   HEMOGLOBIN A1C  10/19/2024   Diabetic kidney evaluation - eGFR measurement  05/23/2025   OPHTHALMOLOGY EXAM  07/07/2025   Medicare Annual Wellness (AWV)  10/20/2025   DTaP/Tdap/Td (3 - Tdap) 05/25/2030   Pneumococcal Vaccine: 50+ Years  Completed    Hepatitis C Screening  Completed   Zoster Vaccines- Shingrix   Completed   Meningococcal B Vaccine  Aged Out        Assessment/Plan:  This is a routine wellness examination for Mark Allen.  Patient Care Team: Melvin Pao, NP as PCP - General (Nurse Practitioner) Nyle Rankin POUR, RPH (Inactive) (Pharmacist) Pa, [facility])  I have personally reviewed and noted the following in the patients chart:   Medical and social history Use of alcohol, tobacco or illicit drugs  Current medications and supplements including opioid prescriptions. Functional ability and status Nutritional status Physical activity Advanced directives List of other physicians Hospitalizations, surgeries, and ER visits in previous 12 months Vitals Screenings to include cognitive, depression, and falls Referrals and appointments  No orders of the defined types were placed in this encounter.  In addition, I have reviewed and discussed with patient certain preventive protocols, quality metrics, and best practice recommendations. A written personalized care plan for preventive services as well as general preventive health recommendations were provided to patient.   Mark Allen, CMA   10/20/2024   No follow-ups on file.  After Visit Summary: (In Person-Declined) Patient declined AVS at this time.   "

## 2024-10-20 NOTE — Assessment & Plan Note (Signed)
 Chronic, Controlled but has been trending up. Most recent A1c was 7.0%- recheck today for monitoring, Results to dictate further management  Currently taking Ozempic  0.5 mg weekly injection, Metformin  1000 mg PO BID, Jardiance  25 mgPO every day.  Will increase Ozempic  to 1mg  weekly. Follow up in 6 months or sooner if concerns arise

## 2024-10-20 NOTE — Assessment & Plan Note (Signed)
 Chronic.  Controlled.  Continue with current medication regimen on Amlodipine  5mg  and Benzapril 20mg  daily.  Refills sent today.  Labs ordered today.  Return to clinic in 6 months for reevaluation.  Call sooner if concerns arise.

## 2024-10-20 NOTE — Progress Notes (Signed)
 "  BP 124/75 (BP Location: Left Arm, Patient Position: Sitting, Cuff Size: Normal)   Pulse 67   Temp 98 F (36.7 C) (Oral)   Ht 5' 10.28 (1.785 m)   Wt 192 lb 9.6 oz (87.4 kg)   SpO2 93%   BMI 27.42 kg/m    Subjective:    Patient ID: Mark Allen, male    DOB: [birth year], 74 y.o.   MRN: [id number]  HPI: Mark Allen is a 73 y.o. male presenting on 10/20/2024 for comprehensive medical examination. Current medical complaints include:none  He currently lives with: Interim Problems from his last visit: no  DIABETES Ozempic  0.5mg  weekly, metformin  1000mg  BID, Jardiance  25mg .  Hypoglycemic episodes:no Polydipsia/polyuria: increased urination Visual disturbance: no Chest pain: no Paresthesias: yes Glucose Monitoring: yes  Accucheck frequency: Daily  Fasting glucose: 140-165  Post prandial:  Evening:  Before meals: Taking Insulin ?:   Long acting insulin :   Short acting insulin : Blood Pressure Monitoring: not checking Retinal Examination: up to date Foot Exam: Up to Date Diabetic Education: Not Completed Pneumovax: Up to Date Influenza: Not up to Date Aspirin: no  HYPERTENSION / HYPERLIPIDEMIA Satisfied with current treatment? no Duration of hypertension: years BP monitoring frequency: not checking BP range:  BP medication side effects: no Past BP meds: amlodipine  and benazepril  Duration of hyperlipidemia: years Cholesterol medication side effects: no Cholesterol supplements: none Past cholesterol medications: rosuvastatin  (crestor ) Medication compliance: excellent compliance Aspirin: no Recent stressors: no Recurrent headaches: no Visual changes: no Palpitations: no Dyspnea: no Chest pain: no Lower extremity edema: no Dizzy/lightheaded: no  MOOD Feels like his mood is fine.  Doing well with Duloxetine  and Trazodone .  Denies concerns at visit today.  He is sleeping well.  Denies SI.  Depression Screen done today and results listed below:     10/20/2024     8:14 AM 04/18/2024    8:37 AM 01/13/2024    8:17 AM 10/12/2023    9:03 AM 09/14/2023    1:17 PM  Depression screen PHQ 2/9  Decreased Interest 0 0 0 0 0  Down, Depressed, Hopeless 0 0 0 0 0  PHQ - 2 Score 0 0 0 0 0  Altered sleeping  0  0 0  Tired, decreased energy  0  0 0  Change in appetite  0  0 0  Feeling bad or failure about yourself   0  0 0  Trouble concentrating  0  0 0  Moving slowly or fidgety/restless  0  0 0  Suicidal thoughts  0  0 0  PHQ-9 Score  0   0  0   Difficult doing work/chores  Not difficult at all        Data saved with a previous flowsheet row definition    The patient does not have a history of falls. I did complete a risk assessment for falls. A plan of care for falls was documented.   Past Medical History:  Past Medical History:  Diagnosis Date   Allergy Sulfur   Broken leg 06/2018   Left leg   Calculus of kidney    Cancer (HCC) skin   Diabetes mellitus without complication (HCC) 07/30/2021   Hyperlipidemia    Hypertension    Impotence, organic    Insomnia    Need for shingles vaccine 07/30/2021   Testicular hypofunction    Type II diabetes mellitus with neurological manifestations [facility])    Ulcer     Surgical History:  Past Surgical History:  Procedure Laterality Date   COLONOSCOPY WITH PROPOFOL  N/A 10/22/2018   Procedure: COLONOSCOPY WITH PROPOFOL ;  Surgeon: Therisa Bi, MD;  Location: [facility] ENDOSCOPY;  Service: Gastroenterology;  Laterality: N/A;   EYE SURGERY  cataract   NASAL SINUS SURGERY  09/29/2002   REFRACTIVE SURGERY Bilateral    SPLENECTOMY  09/29/1961   TONSILLECTOMY      Medications:  Current Outpatient Medications on File Prior to Visit  Medication Sig   Blood Glucose Calibration (TRUE METRIX LEVEL 3) High SOLN    Blood Glucose Monitoring Suppl (TRUE METRIX METER) w/Device KIT USE AS DIRECTED   cyanocobalamin  1000 MCG tablet Take 1,000 mcg by mouth daily.   fluticasone (FLONASE) 50 MCG/ACT nasal spray Place 2 sprays  into both nostrils daily.   glucose blood (TRUE METRIX BLOOD GLUCOSE TEST) test strip TEST BLOOD SUGAR TWICE DAILY USE AS INSTRUCTED   TRUEplus Lancets 28G MISC 1 each by Other route in the morning and at bedtime.   No current facility-administered medications on file prior to visit.    Allergies:  Allergies[1]  Social History:  Social History   Socioeconomic History   Marital status: Married    Spouse name: Not on file   Number of children: Not on file   Years of education: Not on file   Highest education level: Some college, no degree  Occupational History   Not on file  Tobacco Use   Smoking status: Former    Current packs/day: 0.00    Types: Cigarettes    Quit date: 01/27/1989    Years since quitting: 35.7   Smokeless tobacco: Former    Types: Chew    Quit date: 09/29/1990  Vaping Use   Vaping status: Never Used  Substance and Sexual Activity   Alcohol use: Yes    Alcohol/week: 3.0 standard drinks of alcohol    Types: 3 Cans of beer per week    Comment: rare,none last 24hrs   Drug use: No   Sexual activity: Yes    Comment: It has slowed down due to ED.  Other Topics Concern   Not on file  Social History Narrative   Not on file   Social Drivers of Health   Tobacco Use: Medium Risk (10/20/2024)   Patient History    Smoking Tobacco Use: Former    Smokeless Tobacco Use: Former    Passive Exposure: Not on Actuary Strain: Patient Declined (10/19/2024)   Overall Financial Resource Strain (CARDIA)    Difficulty of Paying Living Expenses: Patient declined  Food Insecurity: No Food Insecurity (10/20/2024)   Epic    Worried About Programme Researcher, Broadcasting/film/video in the Last Year: Never true    Ran Out of Food in the Last Year: Never true  Transportation Needs: No Transportation Needs (10/20/2024)   Epic    Lack of Transportation (Medical): No    Lack of Transportation (Non-Medical): No  Physical Activity: Inactive (10/20/2024)   Exercise Vital Sign    Days of  Exercise per Week: 0 days    Minutes of Exercise per Session: 0 min  Stress: No Stress Concern Present (10/20/2024)   [employer] of Occupational Health - Occupational Stress Questionnaire    Feeling of Stress: Not at all  Social Connections: Socially Integrated (10/20/2024)   Social Connection and Isolation Panel    Frequency of Communication with Friends and Family: More than three times a week    Frequency of Social Gatherings with Friends and Family: Three times a week  Attends Religious Services: 1 to 4 times per year    Active Member of Clubs or Organizations: Yes    Attends Banker Meetings: 1 to 4 times per year    Marital Status: Married  Recent Concern: Social Connections - Moderately Isolated (10/19/2024)   Social Connection and Isolation Panel    Frequency of Communication with Friends and Family: Three times a week    Frequency of Social Gatherings with Friends and Family: Twice a week    Attends Religious Services: Patient declined    Active Member of Clubs or Organizations: No    Attends Engineer, Structural: Not on file    Marital Status: Married  Catering Manager Violence: Not At Risk (10/20/2024)   Epic    Fear of Current or Ex-Partner: No    Emotionally Abused: No    Physically Abused: No    Sexually Abused: No  Depression (PHQ2-9): Low Risk (10/20/2024)   Depression (PHQ2-9)    PHQ-2 Score: 0  Alcohol Screen: Low Risk (10/19/2024)   Alcohol Screen    Last Alcohol Screening Score (AUDIT): 4  Housing: Low Risk (10/20/2024)   Epic    Unable to Pay for Housing in the Last Year: No    Number of Times Moved in the Last Year: 0    Homeless in the Last Year: No  Utilities: Not At Risk (10/20/2024)   Epic    Threatened with loss of utilities: No  Health Literacy: Adequate Health Literacy (10/20/2024)   B1300 Health Literacy    Frequency of need for help with medical instructions: Never   Tobacco Use History[2] Social History   Substance  and Sexual Activity  Alcohol Use Yes   Alcohol/week: 3.0 standard drinks of alcohol   Types: 3 Cans of beer per week   Comment: rare,none last 24hrs    Family History:  Family History  Problem Relation Age of Onset   Cancer Father     Past medical history, surgical history, medications, allergies, family history and social history reviewed with patient today and changes made to appropriate areas of the chart.   Review of Systems  HENT:         Denies vision changes.  Eyes:  Negative for blurred vision and double vision.  Respiratory:  Negative for shortness of breath.   Cardiovascular:  Negative for chest pain, palpitations and leg swelling.  Neurological:  Negative for dizziness, tingling and headaches.  Endo/Heme/Allergies:  Negative for polydipsia.       Denies Polyuria  Psychiatric/Behavioral:  The patient has insomnia.    All other ROS negative except what is listed above and in the HPI.      Objective:    BP 124/75 (BP Location: Left Arm, Patient Position: Sitting, Cuff Size: Normal)   Pulse 67   Temp 98 F (36.7 C) (Oral)   Ht 5' 10.28 (1.785 m)   Wt 192 lb 9.6 oz (87.4 kg)   SpO2 93%   BMI 27.42 kg/m   Wt Readings from Last 3 Encounters:  10/20/24 192 lb 9.6 oz (87.4 kg)  07/25/24 182 lb (82.6 kg)  05/23/24 182 lb (82.6 kg)    Physical Exam Vitals and nursing note reviewed.  Constitutional:      General: He is not in acute distress.    Appearance: Normal appearance. He is not ill-appearing, toxic-appearing or diaphoretic.  HENT:     Head: Normocephalic.     Right Ear: Tympanic membrane, ear canal and external  ear normal.     Left Ear: Tympanic membrane, ear canal and external ear normal.     Nose: Nose normal. No congestion or rhinorrhea.     Mouth/Throat:     Mouth: Mucous membranes are moist.  Eyes:     General:        Right eye: No discharge.        Left eye: No discharge.     Extraocular Movements: Extraocular movements intact.      Conjunctiva/sclera: Conjunctivae normal.     Pupils: Pupils are equal, round, and reactive to light.  Cardiovascular:     Rate and Rhythm: Normal rate and regular rhythm.     Heart sounds: No murmur heard. Pulmonary:     Effort: Pulmonary effort is normal. No respiratory distress.     Breath sounds: Normal breath sounds. No wheezing, rhonchi or rales.  Abdominal:     General: Abdomen is flat. Bowel sounds are normal. There is no distension.     Palpations: Abdomen is soft.     Tenderness: There is no abdominal tenderness. There is no guarding.  Musculoskeletal:     Cervical back: Normal range of motion and neck supple.  Skin:    General: Skin is warm and dry.     Capillary Refill: Capillary refill takes less than 2 seconds.  Neurological:     General: No focal deficit present.     Mental Status: He is alert and oriented to person, place, and time.     Cranial Nerves: No cranial nerve deficit.     Motor: No weakness.     Deep Tendon Reflexes: Reflexes normal.  Psychiatric:        Mood and Affect: Mood normal.        Behavior: Behavior normal.        Thought Content: Thought content normal.        Judgment: Judgment normal.     Results for orders placed or performed in visit on 07/25/24  Spotted Fever Group Antibodies   Collection Time: 07/25/24  4:22 PM  Result Value Ref Range   Spotted Fever Group IgG <1:64 Neg:<1:64   Spotted Fever Group IgM 1:128 (H) Neg:<1:64   Result Comment Comment       Assessment & Plan:   Problem List Items Addressed This Visit       Cardiovascular and Mediastinum   Essential hypertension   Chronic.  Controlled.  Continue with current medication regimen on Amlodipine  5mg  and Benzapril 20mg  daily.  Refills sent today.  Labs ordered today.  Return to clinic in 6 months for reevaluation.  Call sooner if concerns arise.       Relevant Medications   amLODipine  (NORVASC ) 5 MG tablet   benazepril  (LOTENSIN ) 20 MG tablet   rosuvastatin  (CRESTOR )  40 MG tablet     Endocrine   Type II diabetes mellitus with neurological manifestations (HCC)   Chronic, Controlled but has been trending up. Most recent A1c was 7.0%- recheck today for monitoring, Results to dictate further management  Currently taking Ozempic  0.5 mg weekly injection, Metformin  1000 mg PO BID, Jardiance  25 mgPO every day.  Will increase Ozempic  to 1mg  weekly. Follow up in 6 months or sooner if concerns arise       Relevant Medications   Semaglutide , 1 MG/DOSE, 4 MG/3ML SOPN   benazepril  (LOTENSIN ) 20 MG tablet   empagliflozin  (JARDIANCE ) 25 MG TABS tablet   metFORMIN  (GLUCOPHAGE ) 1000 MG tablet   rosuvastatin  (CRESTOR ) 40 MG  tablet   Other Relevant Orders   Hemoglobin A1c   Microalbumin, Urine Waived   Diabetes mellitus treated with injections of non-insulin  medication (HCC)   Relevant Medications   Semaglutide , 1 MG/DOSE, 4 MG/3ML SOPN   benazepril  (LOTENSIN ) 20 MG tablet   empagliflozin  (JARDIANCE ) 25 MG TABS tablet   metFORMIN  (GLUCOPHAGE ) 1000 MG tablet   rosuvastatin  (CRESTOR ) 40 MG tablet     Other   Hyperlipidemia   Chronic.  Controlled.  Continue with current medication regimen of Crestor  40mg  daily.  Refills sent today.  Labs ordered today.  Return to clinic in 6 months for reevaluation.  Call sooner if concerns arise.       Relevant Medications   amLODipine  (NORVASC ) 5 MG tablet   benazepril  (LOTENSIN ) 20 MG tablet   rosuvastatin  (CRESTOR ) 40 MG tablet   Other Relevant Orders   Lipid panel   Insomnia   Chronic.  Controlled.  Continue with current medication regimen of Trazodone .  Labs ordered today.  Return to clinic in 6 months for reevaluation.  Call sooner if concerns arise.       Relevant Medications   traZODone  (DESYREL ) 50 MG tablet   Vitamin B 12 deficiency   Labs ordered at visit today.  Will make recommendations based on lab results.        Relevant Orders   B12   Other Visit Diagnoses       Annual physical exam    -  Primary    Health maintenance reviewed during visit today.  Labs ordered.  Vaccines reviewed.  COlonoscopy ordered.   Relevant Orders   Comprehensive metabolic panel with GFR   Hemoglobin A1c   Lipid panel   Microalbumin, Urine Waived   CBC With Diff/Platelet   B12   TSH   PSA     Encounter for Medicare annual wellness exam         Screening for colon cancer       Relevant Orders   Ambulatory referral to Gastroenterology     Flu vaccine need       Relevant Orders   Flu vaccine HIGH DOSE PF(Fluzone Trivalent)        Discussed aspirin prophylaxis for myocardial infarction prevention and decision was it was not indicated  LABORATORY TESTING:  Health maintenance labs ordered today as discussed above.   The natural history of prostate cancer and ongoing controversy regarding screening and potential treatment outcomes of prostate cancer has been discussed with the patient. The meaning of a false positive PSA and a false negative PSA has been discussed. He indicates understanding of the limitations of this screening test and wishes to proceed with screening PSA testing.   IMMUNIZATIONS:   - Tdap: Tetanus vaccination status reviewed: last tetanus booster within 10 years. - Influenza: Administered today - Pneumovax: Up to date - Prevnar: Up to date - COVID: Refused - HPV: Not applicable - Shingrix  vaccine: Up to date  SCREENING: - Colonoscopy: Ordered today  Discussed with patient purpose of the colonoscopy is to detect colon cancer at curable precancerous or early stages   - AAA Screening: Not applicable  -Hearing Test: Not applicable  -Spirometry: Not applicable   PATIENT COUNSELING:    Sexuality: Discussed sexually transmitted diseases, partner selection, use of condoms, avoidance of unintended pregnancy  and contraceptive alternatives.   Advised to avoid cigarette smoking.  I discussed with the patient that most people either abstain from alcohol or drink within safe limits  (<=14/week and <=4  drinks/occasion for males, <=7/weeks and <= 3 drinks/occasion for females) and that the risk for alcohol disorders and other health effects rises proportionally with the number of drinks per week and how often a drinker exceeds daily limits.  Discussed cessation/primary prevention of drug use and availability of treatment for abuse.   Diet: Encouraged to adjust caloric intake to maintain  or achieve ideal body weight, to reduce intake of dietary saturated fat and total fat, to limit sodium intake by avoiding high sodium foods and not adding table salt, and to maintain adequate dietary potassium and calcium  preferably from fresh fruits, vegetables, and low-fat dairy products.    stressed the importance of regular exercise  Injury prevention: Discussed safety belts, safety helmets, smoke detector, smoking near bedding or upholstery.   Dental health: Discussed importance of regular tooth brushing, flossing, and dental visits.   Follow up plan: NEXT PREVENTATIVE PHYSICAL DUE IN 1 YEAR. Return in about 6 months (around 04/19/2025) for HTN, HLD, DM2 FU.     [1]  Allergies Allergen Reactions   Sulfa Antibiotics    Sulfacetamide Sodium   [2]  Social History Tobacco Use  Smoking Status Former   Current packs/day: 0.00   Types: Cigarettes   Quit date: 01/27/1989   Years since quitting: 35.7  Smokeless Tobacco Former   Types: Chew   Quit date: 09/29/1990   "

## 2024-10-21 ENCOUNTER — Ambulatory Visit: Payer: Self-pay | Admitting: Nurse Practitioner

## 2024-10-21 LAB — CBC WITH DIFF/PLATELET
Basophils Absolute: 0.1 x10E3/uL (ref 0.0–0.2)
Basos: 1 %
EOS (ABSOLUTE): 0.1 x10E3/uL (ref 0.0–0.4)
Eos: 2 %
Hematocrit: 46.3 % (ref 37.5–51.0)
Hemoglobin: 15.5 g/dL (ref 13.0–17.7)
Immature Grans (Abs): 0 x10E3/uL (ref 0.0–0.1)
Immature Granulocytes: 0 %
Lymphocytes Absolute: 2.2 x10E3/uL (ref 0.7–3.1)
Lymphs: 32 %
MCH: 32 pg (ref 26.6–33.0)
MCHC: 33.5 g/dL (ref 31.5–35.7)
MCV: 96 fL (ref 79–97)
Monocytes Absolute: 0.8 x10E3/uL (ref 0.1–0.9)
Monocytes: 13 %
Neutrophils Absolute: 3.5 x10E3/uL (ref 1.4–7.0)
Neutrophils: 52 %
Platelets: 341 x10E3/uL (ref 150–450)
RBC: 4.85 x10E6/uL (ref 4.14–5.80)
RDW: 12.2 % (ref 11.6–15.4)
WBC: 6.7 x10E3/uL (ref 3.4–10.8)

## 2024-10-21 LAB — LIPID PANEL
Chol/HDL Ratio: 2.4 ratio (ref 0.0–5.0)
Cholesterol, Total: 134 mg/dL (ref 100–199)
HDL: 55 mg/dL
LDL Chol Calc (NIH): 64 mg/dL (ref 0–99)
Triglycerides: 73 mg/dL (ref 0–149)
VLDL Cholesterol Cal: 15 mg/dL (ref 5–40)

## 2024-10-21 LAB — COMPREHENSIVE METABOLIC PANEL WITH GFR
ALT: 19 IU/L (ref 0–44)
AST: 18 IU/L (ref 0–40)
Albumin: 4.5 g/dL (ref 3.8–4.8)
Alkaline Phosphatase: 66 IU/L (ref 47–123)
BUN/Creatinine Ratio: 29 — ABNORMAL HIGH (ref 10–24)
BUN: 20 mg/dL (ref 8–27)
Bilirubin Total: 0.4 mg/dL (ref 0.0–1.2)
CO2: 22 mmol/L (ref 20–29)
Calcium: 9.3 mg/dL (ref 8.6–10.2)
Chloride: 99 mmol/L (ref 96–106)
Creatinine, Ser: 0.7 mg/dL — ABNORMAL LOW (ref 0.76–1.27)
Globulin, Total: 2.5 g/dL (ref 1.5–4.5)
Glucose: 121 mg/dL — ABNORMAL HIGH (ref 70–99)
Potassium: 4.8 mmol/L (ref 3.5–5.2)
Sodium: 137 mmol/L (ref 134–144)
Total Protein: 7 g/dL (ref 6.0–8.5)
eGFR: 99 mL/min/1.73

## 2024-10-21 LAB — VITAMIN B12: Vitamin B-12: 452 pg/mL (ref 232–1245)

## 2024-10-21 LAB — HEMOGLOBIN A1C
Est. average glucose Bld gHb Est-mCnc: 163 mg/dL
Hgb A1c MFr Bld: 7.3 % — ABNORMAL HIGH (ref 4.8–5.6)

## 2024-10-21 LAB — TSH: TSH: 1.74 u[IU]/mL (ref 0.450–4.500)

## 2024-10-21 LAB — PSA: Prostate Specific Ag, Serum: 0.2 ng/mL (ref 0.0–4.0)

## 2025-04-20 ENCOUNTER — Ambulatory Visit: Admitting: Nurse Practitioner
